# Patient Record
Sex: Female | Born: 1990 | Race: Black or African American | Hispanic: No | Marital: Single | Smoking: Never smoker
Health system: Southern US, Community
[De-identification: ages and names within clinical notes are randomized; demographics above are authoritative.]

## PROBLEM LIST (undated history)

## (undated) ENCOUNTER — Inpatient Hospital Stay (HOSPITAL_COMMUNITY): Payer: Self-pay

## (undated) ENCOUNTER — Inpatient Hospital Stay (HOSPITAL_COMMUNITY): Payer: Medicaid Other

## (undated) DIAGNOSIS — J302 Other seasonal allergic rhinitis: Secondary | ICD-10-CM

## (undated) DIAGNOSIS — B999 Unspecified infectious disease: Secondary | ICD-10-CM

## (undated) DIAGNOSIS — O139 Gestational [pregnancy-induced] hypertension without significant proteinuria, unspecified trimester: Secondary | ICD-10-CM

## (undated) DIAGNOSIS — L309 Dermatitis, unspecified: Secondary | ICD-10-CM

## (undated) DIAGNOSIS — N926 Irregular menstruation, unspecified: Secondary | ICD-10-CM

## (undated) DIAGNOSIS — O409XX0 Polyhydramnios, unspecified trimester, not applicable or unspecified: Secondary | ICD-10-CM

## (undated) DIAGNOSIS — Z3687 Encounter for antenatal screening for uncertain dates: Secondary | ICD-10-CM

## (undated) DIAGNOSIS — R51 Headache: Secondary | ICD-10-CM

## (undated) DIAGNOSIS — N12 Tubulo-interstitial nephritis, not specified as acute or chronic: Secondary | ICD-10-CM

## (undated) HISTORY — DX: Unspecified infectious disease: B99.9

## (undated) HISTORY — PX: NO PAST SURGERIES: SHX2092

## (undated) HISTORY — DX: Headache: R51

## (undated) HISTORY — DX: Polyhydramnios, unspecified trimester, not applicable or unspecified: O40.9XX0

## (undated) HISTORY — DX: Gestational (pregnancy-induced) hypertension without significant proteinuria, unspecified trimester: O13.9

---
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
## 2011-04-05 NOTE — L&D Delivery Note (Signed)
Delivery Note At 8:55 PM a viable female infant was delivered via Vaginal, Spontaneous Delivery (Presentation: LOA.  APGAR: weight .   Placenta status: Intact, Spontaneous.  Cord: 3 vessels with the following complications. Nuchal cord x 1 and reduced before delivery of anterior shoulder. Body cord released with delivery of the body. [place] Cord Blood Donor None.  Cord pH: not done Baby delivered on to maternal abdomen and skin to skin initiated immediately after delivery. Anesthesia: Epidural  Episiotomy: None Lacerations: None Suture Repair: not required Est. Blood Loss (mL): 200  Mom will transfer to Postpartum unit with her baby.  Baby to nursery-stable  Orvetta Danielski, CNM. 01/19/2012, 9:17 PM

## 2011-06-17 ENCOUNTER — Emergency Department (HOSPITAL_COMMUNITY): Payer: Medicaid Other

## 2011-06-17 ENCOUNTER — Emergency Department (HOSPITAL_COMMUNITY)
Admission: EM | Admit: 2011-06-17 | Discharge: 2011-06-17 | Disposition: A | Discharge status: Home / Self Care | Payer: Medicaid Other | Attending: Emergency Medicine | Admitting: Emergency Medicine

## 2011-06-17 ENCOUNTER — Encounter (HOSPITAL_COMMUNITY): Payer: Self-pay | Admitting: Emergency Medicine

## 2011-06-17 ENCOUNTER — Other Ambulatory Visit: Payer: Self-pay

## 2011-06-17 DIAGNOSIS — O99891 Other specified diseases and conditions complicating pregnancy: Secondary | ICD-10-CM | POA: Insufficient documentation

## 2011-06-17 DIAGNOSIS — Z331 Pregnant state, incidental: Secondary | ICD-10-CM

## 2011-06-17 DIAGNOSIS — N949 Unspecified condition associated with female genital organs and menstrual cycle: Secondary | ICD-10-CM | POA: Insufficient documentation

## 2011-06-17 DIAGNOSIS — R3919 Other difficulties with micturition: Secondary | ICD-10-CM | POA: Insufficient documentation

## 2011-06-17 DIAGNOSIS — R1110 Vomiting, unspecified: Secondary | ICD-10-CM | POA: Insufficient documentation

## 2011-06-17 DIAGNOSIS — N898 Other specified noninflammatory disorders of vagina: Secondary | ICD-10-CM | POA: Insufficient documentation

## 2011-06-17 DIAGNOSIS — R112 Nausea with vomiting, unspecified: Secondary | ICD-10-CM | POA: Insufficient documentation

## 2011-06-17 DIAGNOSIS — R109 Unspecified abdominal pain: Secondary | ICD-10-CM | POA: Insufficient documentation

## 2011-06-17 DIAGNOSIS — R10819 Abdominal tenderness, unspecified site: Secondary | ICD-10-CM | POA: Insufficient documentation

## 2011-06-17 LAB — URINE MICROSCOPIC-ADD ON

## 2011-06-17 LAB — URINALYSIS, ROUTINE W REFLEX MICROSCOPIC
Bilirubin Urine: NEGATIVE
Glucose, UA: NEGATIVE mg/dL
Hgb urine dipstick: NEGATIVE
Protein, ur: NEGATIVE mg/dL
Specific Gravity, Urine: 1.019 (ref 1.005–1.030)
Urobilinogen, UA: 0.2 mg/dL (ref 0.0–1.0)

## 2011-06-17 LAB — WET PREP, GENITAL
Clue Cells Wet Prep HPF POC: NONE SEEN
Trich, Wet Prep: NONE SEEN
Yeast Wet Prep HPF POC: NONE SEEN

## 2011-06-17 LAB — HCG, QUANTITATIVE, PREGNANCY: hCG, Beta Chain, Quant, S: 120548 m[IU]/mL — ABNORMAL HIGH (ref <5)

## 2011-06-17 LAB — POCT PREGNANCY, URINE: Preg Test, Ur: POSITIVE — AB

## 2011-06-17 MED ORDER — ONDANSETRON 8 MG PO TBDP: 8.0000 mg | ORAL_TABLET | Freq: Three times a day (TID) | ORAL | Status: AC | PRN

## 2011-06-17 MED ORDER — ONDANSETRON HCL 8 MG PO TABS: 4.0000 mg | ORAL_TABLET | Freq: Three times a day (TID) | ORAL | Status: DC | PRN

## 2011-06-17 MED ORDER — ZOLPIDEM TARTRATE 5 MG PO TABS: 5.0000 mg | ORAL_TABLET | Freq: Every evening | ORAL | Status: DC | PRN

## 2011-06-17 MED ORDER — IBUPROFEN 200 MG PO TABS: 600.0000 mg | ORAL_TABLET | Freq: Three times a day (TID) | ORAL | Status: DC | PRN

## 2011-06-17 MED ORDER — ACETAMINOPHEN 325 MG PO TABS: 650.0000 mg | ORAL_TABLET | ORAL | Status: DC | PRN

## 2011-06-17 MED ORDER — ONDANSETRON 4 MG PO TBDP
8.0000 mg | ORAL_TABLET | Freq: Once | ORAL | Status: AC
  Administered 2011-06-17: 8 mg via ORAL
  Filled 2011-06-17: qty 2

## 2011-06-17 NOTE — ED Provider Notes (Signed)
History     CSN: [id number]  Arrival date & time 06/17/11  1032   First MD Initiated Contact with Patient 06/17/11 1319      Chief Complaint  Patient presents with  . Emesis    (Consider location/radiation/quality/duration/timing/severity/associated sxs/prior treatment) HPI Comments: Theresa Lawson is a 21 y.o. female who is here for low abdominal pain with nausea and vomiting. For one to 2 weeks. She has chronic intermittent chest pain since she was an infant. Her last menstrual period was 05/06/2011. She denies vaginal bleeding or discharge. She had an eye daily until 3 months ago, when it was removed. She has no fever, or diarrhea. Has been a change in urination. She's not tried any medicines at home. She thinks that she might be pregnant.  The history is provided by the patient.    History reviewed. No pertinent past medical history.  History reviewed. No pertinent past surgical history.  No family history on file.  History  Substance Use Topics  . Smoking status: Not on file  . Smokeless tobacco: Not on file  . Alcohol Use: Not on file    OB History    Grav Para Term Preterm Abortions TAB SAB Ect Mult Living                  Review of Systems  All other systems reviewed and are negative.    Allergies  Review of patient's allergies indicates no known allergies.  Home Medications  No current outpatient prescriptions on file.  BP 111/66  Pulse 73  Temp(Src) 98.1 F (36.7 C) (Oral)  Resp 16  SpO2 99%  LMP 05/06/2011  Physical Exam  Nursing note and vitals reviewed. Constitutional: She is oriented to person, place, and time. She appears well-developed and well-nourished.  HENT:  Head: Normocephalic and atraumatic.  Eyes: Conjunctivae and EOM are normal. Pupils are equal, round, and reactive to light.  Neck: Normal range of motion and phonation normal. Neck supple.  Cardiovascular: Normal rate, regular rhythm and intact distal pulses.   Pulmonary/Chest:  Effort normal and breath sounds normal. She exhibits no tenderness.  Abdominal: Soft. She exhibits no distension and no mass. There is tenderness (Bilateral right and lower quadrant tenderness, mild). There is no rebound and no guarding.  Genitourinary:       Normal external female genitalia. Small amount of opaque vaginal discharge. Cervix is normal. It is long and closed. Bimanual examination : the uterus is not enlarged. There is mild midline and left adnexal tenderness. No palpable mass. The ovaries could not be palpated.   Musculoskeletal: Normal range of motion.  Neurological: She is alert and oriented to person, place, and time. She has normal strength. She exhibits normal muscle tone.  Skin: Skin is warm and dry.  Psychiatric: She has a normal mood and affect. Her behavior is normal. Judgment and thought content normal.    ED Course  Procedures (including critical care time)  Date: 06/17/2011  Rate: 80  Rhythm: normal sinus rhythm  QRS Axis: normal  Intervals: normal  ST/T Wave abnormalities: normal  Conduction Disutrbances:none  Narrative Interpretation:   Old EKG Reviewed: unchanged  5:35 PM Reevaluation with update and discussion. After initial assessment and treatment, an updated evaluation reveals patient is comfortable. Repeat vital signs are normal.. Theresa Lawson    Labs Reviewed  URINALYSIS, ROUTINE W REFLEX MICROSCOPIC - Abnormal; Notable for the following:    APPearance CLOUDY (*)    pH 8.5 (*)    Leukocytes,  UA TRACE (*)    All other components within normal limits  POCT PREGNANCY, URINE - Abnormal; Notable for the following:    Preg Test, Ur POSITIVE (*)    All other components within normal limits  URINE MICROSCOPIC-ADD ON - Abnormal; Notable for the following:    Bacteria, UA FEW (*)    All other components within normal limits  WET PREP, GENITAL - Abnormal; Notable for the following:    WBC, Wet Prep HPF POC FEW (*)    All other components within  normal limits  HCG, QUANTITATIVE, PREGNANCY - Abnormal; Notable for the following:    hCG, Beta Chain, [place] Longest [id number] (*)    All other components within normal limits  GC/CHLAMYDIA PROBE AMP, GENITAL   US Ob Comp Less 14 Wks  06/17/2011  *RADIOLOGY REPORT*  Clinical Data: *RADIOLOGY REPORT*  Clinical Data: Pelvic pain.  OBSTETRIC <14 WK [place] AND TRANSVAGINAL OB US  Technique:  Both transabdominal and transvaginal ultrasound examinations were performed for complete evaluation of the gestation as well as the maternal uterus, adnexal regions, and pelvic cul-de-sac.  Transvaginal technique was performed to assess early pregnancy.  Comparison:  None.  Intrauterine gestational sac:  Visualized/normal in shape. Yolk sac: Seen Embryo: Seen Cardiac Activity: Seen Heart Rate: 150 bpm  MSD:   mm      w     d CRL: 29.0   mm  9   w  5   d        [place] EDC: 01/15/2012  Maternal uterus/adnexae: Both ovaries appear normal. A small subacute subchorionic hemorrhage is seen.  IMPRESSION: Single living intrauterine pregnancy demonstrating an EGA by CRL of 9w 5d. Small subacute subchorionic hemorrhage. Normal ovaries.  Original Report Authenticated By: Bertha Stakes, M.D.   US Ob Transvaginal  06/17/2011  *RADIOLOGY REPORT*  Clinical Data: *RADIOLOGY REPORT*  Clinical Data: Pelvic pain.  OBSTETRIC <14 WK [place] AND TRANSVAGINAL OB US  Technique:  Both transabdominal and transvaginal ultrasound examinations were performed for complete evaluation of the gestation as well as the maternal uterus, adnexal regions, and pelvic cul-de-sac.  Transvaginal technique was performed to assess early pregnancy.  Comparison:  None.  Intrauterine gestational sac:  Visualized/normal in shape. Yolk sac: Seen Embryo: Seen Cardiac Activity: Seen Heart Rate: 150 bpm  MSD:   mm      w     d CRL: 29.0   mm  9   w  5   d        [place] EDC: 01/15/2012  Maternal uterus/adnexae: Both ovaries appear normal. A small subacute subchorionic hemorrhage is seen.   IMPRESSION: Single living intrauterine pregnancy demonstrating an EGA by CRL of 9w 5d. Small subacute subchorionic hemorrhage. Normal ovaries.  Original Report Authenticated By: Bertha Stakes, M.D.     1. Abdominal pain   2. Pregnancy, incidental       MDM  Nonspecific lower abdominal pain, with early pregnancy, not an ectopic. No evidence of vaginal or urinary tract infection, cultures are ordered  Plan: Home Medications- none; Home Treatments- rest, Tylenol prn; Recommended follow up- OB appt. ASAP        Flint Melter, MD 06/17/11 [phone]

## 2011-06-17 NOTE — ED Notes (Signed)
NPO until all results back.

## 2011-06-17 NOTE — ED Notes (Signed)
N/v/cp x 1-2 weeks she states

## 2011-06-17 NOTE — Discharge Instructions (Signed)
Abdominal Pain (Nonspecific)  Your exam might not show the exact reason you have abdominal pain. Since there are many different causes of abdominal pain, another checkup and more tests may be needed. It is very important to follow up for lasting (persistent) or worsening symptoms. A possible cause of abdominal pain in any person who still has his or her appendix is acute appendicitis. Appendicitis is often hard to diagnose. Normal blood tests, urine tests, ultrasound, and CT scans do not completely rule out early appendicitis or other causes of abdominal pain. Sometimes, only the changes that happen over time will allow appendicitis and other causes of abdominal pain to be determined. Other potential problems that may require surgery may also take time to become more apparent. Because of this, it is important that you follow all of the instructions below.  HOME CARE INSTRUCTIONS   · Rest as much as possible.  · Do not eat solid food until your pain is gone.  · While adults or children have pain: A diet of water, weak decaffeinated tea, broth or bouillon, gelatin, oral rehydration solutions (ORS), frozen ice pops, or ice chips may be helpful.  · When pain is gone in adults or children: Start a light diet (dry toast, crackers, applesauce, or white rice). Increase the diet slowly as long as it does not bother you. Eat no dairy products (including cheese and eggs) and no spicy, fatty, fried, or high-fiber foods.  · Use no alcohol, caffeine, or cigarettes.  · Take your regular medicines unless your caregiver told you not to.  · Take any prescribed medicine as directed.  · Only take over-the-counter or prescription medicines for pain, discomfort, or fever as directed by your caregiver. Do not give aspirin to children.  If your caregiver has given you a follow-up appointment, it is very important to keep that appointment. Not keeping the appointment could result in a permanent injury and/or lasting (chronic) pain and/or  disability. If there is any problem keeping the appointment, you must call to reschedule.   SEEK IMMEDIATE MEDICAL CARE IF:   · Your pain is not gone in 24 hours.  · Your pain becomes worse, changes location, or feels different.  · You or your child has an oral temperature above 102° F (38.9° C), not controlled by medicine.  · Your baby is older than 3 months with a rectal temperature of 102° F (38.9° C) or higher.  · Your baby is 3 months old or younger with a rectal temperature of 100.4° F (38° C) or higher.  · You have shaking chills.  · You keep throwing up (vomiting) or cannot drink liquids.  · There is blood in your vomit or you see blood in your bowel movements.  · Your bowel movements become dark or black.  · You have frequent bowel movements.  · Your bowel movements stop (become blocked) or you cannot pass gas.  · You have bloody, frequent, or painful urination.  · You have yellow discoloration in the skin or whites of the eyes.  · Your stomach becomes bloated or bigger.  · You have dizziness or fainting.  · You have chest or back pain.  MAKE SURE YOU:   · Understand these instructions.  · Will watch your condition.  · Will get help right away if you are not doing well or get worse.  Document Released: 03/21/2005 Document Revised: 03/10/2011 Document Reviewed: 02/16/2009  ExitCare® Patient Information ©2012 [employer].  Pregnancy  If you are   planning on getting pregnant, it is a good idea to make a preconception appointment with your care- giver to discuss having a healthy lifestyle before getting pregnant. Such as, diet, weight, exercise, taking prenatal vitamins especially folic acid (it helps prevent brain and spinal cord defects), avoiding alcohol, smoking and illegal drugs, medical problems (diabetes, convulsions), family history of genetic problems, working conditions and immunizations. It is better to have knowledge of these things and do something about them before getting pregnant.  In your  pregnancy, it is important to follow certain guidelines to have a healthy baby. It is very important to get good prenatal care and follow your caregiver's instructions. Prenatal care includes all the medical care you receive before your baby's birth. This helps to prevent problems during the pregnancy and childbirth.  HOME CARE INSTRUCTIONS   · Start your prenatal visits by the 12th week of pregnancy or before when possible. They are usually scheduled monthly at first. They are more often in the last 2 months before delivery. It is important that you keep your caregiver's appointments and follow your caregiver's instructions regarding medication use, exercise, and diet.  · During pregnancy, you are providing food for you and your baby. Eat a regular, well-balanced diet. Choose foods such as meat, fish, milk and other dairy products, vegetables, fruits, whole-grain breads and cereals. Your caregiver will inform you of the ideal weight gain depending on your current height and weight. Drink lots of liquids. Try to drink 8 glasses of water a day.  · Alcohol is associated with a number of birth defects including fetal alcohol syndrome. It is best to avoid alcohol completely. Smoking will cause low birth rate and prematurity. Use of alcohol and nicotine during your pregnancy also increases the chances that your child will be chemically dependent later in their life and may contribute to SIDS (Sudden Infant Death Syndrome).  · Do not use illegal drugs.  · Only take prescription or over-the-counter medications that are recommended by your caregiver. Other medications can cause genetic and physical problems in the baby.  · Morning sickness can often be helped by keeping soda crackers at the bedside. Eat a couple before arising in the morning.  · A sexual relationship may be continued until near the end of pregnancy if there are no other problems such as early (premature) leaking of amniotic fluid from the membranes, vaginal  bleeding, painful intercourse or belly (abdominal) pain.  · Exercise regularly. Check with your caregiver if you are unsure of the safety of some of your exercises.  · Do not use hot tubs, steam rooms or saunas. These increase the risk of fainting or passing out and hurting yourself and the baby. Swimming is OK for exercise. Get plenty of rest, including afternoon naps when possible especially in the third trimester.  · Avoid toxic odors and chemicals.  · Do not wear high heels. They may cause you to lose your balance and fall.  · Do not lift over 5 pounds. If you do lift anything, lift with your legs and thighs, not your back.  · Avoid long trips, especially in the third trimester.  · If you have to travel out of the city or state, take a copy of your medical records with you.  SEEK IMMEDIATE MEDICAL CARE IF:   · You develop an unexplained oral temperature above 102° F (38.9° C), or as your caregiver suggests.  · You have leaking of fluid from the vagina. If leaking membranes are suspected,   take your temperature and inform your caregiver of this when you call.  · There is vaginal spotting or bleeding. Notify your caregiver of the amount and how many pads are used.  · You continue to feel sick to your stomach (nauseous) and have no relief from remedies suggested, or you throw up (vomit) blood or coffee ground like materials.  · You develop upper abdominal pain.  · You have round ligament discomfort in the lower abdominal area. This still must be evaluated by your caregiver.  · You feel contractions of the uterus.  · You do not feel the baby move, or there is less movement than before.  · You have painful urination.  · You have abnormal vaginal discharge.  · You have persistent diarrhea.  · You get a severe headache.  · You have problems with your vision.  · You develop muscle weakness.  · You feel dizzy and faint.  · You develop shortness of breath.  · You develop chest pain.  · You have back pain that travels down  to your leg and feet.  · You feel irregular or a very fast heartbeat.  · You develop excessive weight gain in a short period of time (5 pounds in 3 to 5 days).  · You are involved with a domestic violence situation.  Document Released: 03/21/2005 Document Revised: 03/10/2011 Document Reviewed: 09/12/2008  ExitCare® Patient Information ©2012 [employer].

## 2011-06-17 NOTE — ED Notes (Signed)
Pt. At Ultrasound

## 2011-06-18 LAB — GC/CHLAMYDIA PROBE AMP, GENITAL: Chlamydia, DNA Probe: NEGATIVE

## 2011-08-05 ENCOUNTER — Inpatient Hospital Stay (HOSPITAL_COMMUNITY): Payer: Medicaid Other

## 2011-08-05 ENCOUNTER — Inpatient Hospital Stay (HOSPITAL_COMMUNITY)
Admission: AD | Admit: 2011-08-05 | Discharge: 2011-08-05 | Disposition: A | Discharge status: Home / Self Care | Payer: Medicaid Other | Source: Ambulatory Visit | Attending: Obstetrics & Gynecology | Admitting: Obstetrics & Gynecology

## 2011-08-05 ENCOUNTER — Encounter (HOSPITAL_COMMUNITY): Payer: Self-pay

## 2011-08-05 DIAGNOSIS — R109 Unspecified abdominal pain: Secondary | ICD-10-CM

## 2011-08-05 DIAGNOSIS — R519 Headache, unspecified: Secondary | ICD-10-CM

## 2011-08-05 DIAGNOSIS — O0930 Supervision of pregnancy with insufficient antenatal care, unspecified trimester: Secondary | ICD-10-CM | POA: Insufficient documentation

## 2011-08-05 DIAGNOSIS — O219 Vomiting of pregnancy, unspecified: Secondary | ICD-10-CM

## 2011-08-05 DIAGNOSIS — O26892 Other specified pregnancy related conditions, second trimester: Secondary | ICD-10-CM

## 2011-08-05 DIAGNOSIS — O210 Mild hyperemesis gravidarum: Secondary | ICD-10-CM

## 2011-08-05 DIAGNOSIS — O26899 Other specified pregnancy related conditions, unspecified trimester: Secondary | ICD-10-CM

## 2011-08-05 HISTORY — DX: Other seasonal allergic rhinitis: J30.2

## 2011-08-05 HISTORY — DX: Tubulo-interstitial nephritis, not specified as acute or chronic: N12

## 2011-08-05 LAB — URINALYSIS, ROUTINE W REFLEX MICROSCOPIC
Bilirubin Urine: NEGATIVE
Glucose, UA: NEGATIVE mg/dL
Hgb urine dipstick: NEGATIVE
Ketones, ur: NEGATIVE mg/dL
Nitrite: NEGATIVE
Specific Gravity, Urine: 1.015 (ref 1.005–1.030)
pH: 7.5 (ref 5.0–8.0)

## 2011-08-05 LAB — CBC
HCT: 31 % — ABNORMAL LOW (ref 36.0–46.0)
Hemoglobin: 10.3 g/dL — ABNORMAL LOW (ref 12.0–15.0)
MCH: 28.3 pg (ref 26.0–34.0)
MCHC: 33.2 g/dL (ref 30.0–36.0)
MCV: 85.2 fL (ref 78.0–100.0)
RBC: 3.64 MIL/uL — ABNORMAL LOW (ref 3.87–5.11)

## 2011-08-05 LAB — WET PREP, GENITAL: Yeast Wet Prep HPF POC: NONE SEEN

## 2011-08-05 MED ORDER — PRENATAL VITAMINS (DIS) PO TABS: 1.0000 | ORAL_TABLET | ORAL | Status: DC

## 2011-08-05 MED ORDER — ONDANSETRON 8 MG PO TBDP: 8.0000 mg | ORAL_TABLET | Freq: Three times a day (TID) | ORAL | Status: AC | PRN

## 2011-08-05 MED ORDER — PROMETHAZINE HCL 25 MG PO TABS: 25.0000 mg | ORAL_TABLET | Freq: Four times a day (QID) | ORAL | Status: DC | PRN

## 2011-08-05 MED ORDER — ONDANSETRON 8 MG PO TBDP
8.0000 mg | ORAL_TABLET | Freq: Once | ORAL | Status: AC
  Administered 2011-08-05: 8 mg via ORAL
  Filled 2011-08-05: qty 1

## 2011-08-05 NOTE — MAU Provider Note (Signed)
History     CSN: [id number]  Arrival date and time: 08/05/11 1020   First Provider Initiated Contact with Patient 08/05/11 1107      Chief Complaint  Patient presents with  . Hyperemesis Gravidarum   HPI Pt is G2P1 with hx of irreg menses.  She had a Mirena taken out and LMP ?Jan or Feb.  She had n ultrasound done at [facility] ED. On 06/17/2011 showing [redacted]w[redacted]d making her [redacted]w[redacted]d today. She also needs confirmation of pregnancy.  She has had a headache and also nausea and vomiting. Pt denies spotting or bleeding or cramping.  She has had little appetite but has been able to keep fluids down.  Her 20 year old son was delivered in [facility] and pregnancy was complicated by hypertension.  She had a vaginal delivery after induction for post dates.  Past Medical History  Diagnosis Date  . Pyelonephritis   . Asthma     childhood  . Seasonal allergies     Past Surgical History  Procedure Date  . Vaginal delivery     History reviewed. No pertinent family history.  History  Substance Use Topics  . Smoking status: Former Games developer  . Smokeless tobacco: Never Used  . Alcohol Use: No    Allergies: No Known Allergies  No prescriptions prior to admission    Review of Systems  Constitutional: Negative for chills.  Respiratory: Negative for cough.   Gastrointestinal: Positive for nausea, vomiting and abdominal pain. Negative for diarrhea and constipation.  Genitourinary: Negative for dysuria and urgency.  Neurological: Positive for headaches. Negative for dizziness.   Physical Exam   Blood pressure 120/66, pulse 98, temperature 98.1 F (36.7 C), resp. rate 18, height 5\' 4"  (1.626 m), weight 133 lb (60.328 kg), last menstrual period 05/06/2011.  Physical Exam  Nursing note and vitals reviewed. Constitutional: She is oriented to person, place, and time. She appears well-developed and well-nourished.  HENT:  Head: Normocephalic.  Eyes: Pupils are equal, round, and reactive to light.    Neck: Normal range of motion. Neck supple.  Respiratory: Effort normal.  GI: Soft. She exhibits no distension and no mass. There is no tenderness. There is no rebound and no guarding.       RLQ mild tenderness with bimanual FHT audible with doppler  Genitourinary: Vagina normal. No vaginal discharge found.       Uterus 16 week size, NT , cervix clean and closed, NT; mild right adnexal tenderness- no rebound  Musculoskeletal: Normal range of motion.  Neurological: She is alert and oriented to person, place, and time.  Skin: Skin is warm and dry.  Psychiatric: She has a normal mood and affect.    MAU Course  Procedures Clinical Data: *RADIOLOGY REPORT* from 06/17/2011 Clinical Data: Pelvic pain.  OBSTETRIC <14 WK [place] AND TRANSVAGINAL OB US  Technique: Both transabdominal and transvaginal ultrasound  examinations were performed for complete evaluation of the  gestation as well as the maternal uterus, adnexal regions, and  pelvic cul-de-sac. Transvaginal technique was performed to assess  early pregnancy.  Comparison: None.  Intrauterine gestational sac: Visualized/normal in shape.  Yolk sac: Seen  Embryo: Seen  Cardiac Activity: Seen  Heart Rate: 150 bpm  MSD: mm w d  CRL: 29.0 mm 9 w 5 d [place] EDC: 01/15/2012  Maternal uterus/adnexae:  Both ovaries appear normal. A small subacute subchorionic  hemorrhage is seen.  IMPRESSION:  Single living intrauterine pregnancy demonstrating an EGA by CRL of  9w 5d. Small  subacute subchorionic hemorrhage. Normal ovaries.  Original Report Authenticated By: Bertha Stakes, M.D.            External Result Report    Results for orders placed during the hospital encounter of 08/05/11 (from the past 24 hour(s))  URINALYSIS, ROUTINE W REFLEX MICROSCOPIC     Status: Abnormal   Collection Time   08/05/11 10:34 AM      Component Value Range   Color, Urine YELLOW  YELLOW    APPearance CLOUDY (*) CLEAR    Specific Gravity, Urine 1.015  1.005  - 1.030    pH 7.5  5.0 - 8.0    Glucose, UA NEGATIVE  NEGATIVE (mg/dL)   Hgb urine dipstick NEGATIVE  NEGATIVE    Bilirubin Urine NEGATIVE  NEGATIVE    Ketones, ur NEGATIVE  NEGATIVE (mg/dL)   Protein, ur NEGATIVE  NEGATIVE (mg/dL)   Urobilinogen, UA 0.2  0.0 - 1.0 (mg/dL)   Nitrite NEGATIVE  NEGATIVE    Leukocytes, UA NEGATIVE  NEGATIVE   WET PREP, GENITAL     Status: Abnormal   Collection Time   08/05/11 11:00 AM      Component Value Range   Yeast Wet Prep HPF POC NONE SEEN  NONE SEEN    Trich, Wet Prep NONE SEEN  NONE SEEN    Clue Cells Wet Prep HPF POC NONE SEEN  NONE SEEN    WBC, Wet Prep HPF POC FEW (*) NONE SEEN   CBC     Status: Abnormal   Collection Time   08/05/11 11:20 AM      Component Value Range   WBC 7.9  4.0 - 10.5 (K/uL)   RBC 3.64 (*) 3.87 - 5.11 (MIL/uL)   Hemoglobin 10.3 (*) 12.0 - 15.0 (g/dL)   HCT 96.0 (*) 45.4 - 46.0 (%)   MCV 85.2  78.0 - 100.0 (fL)   MCH 28.3  26.0 - 34.0 (pg)   MCHC 33.2  30.0 - 36.0 (g/dL)   RDW 09.8  11.9 - 14.7 (%)   Platelets 212  150 - 400 (K/uL)     Assessment and Plan  Second trimester pregnancy- no prenatal care- confirmation of pregnancy letter Proceed with Medicaid application Prescription for phenergan and zofran and prenatal vitamins abd pain in pregnancy Nausea and vomiting in pregnancy-   Brisa Auth 08/05/2011, 11:31 AM

## 2011-08-05 NOTE — Discharge Instructions (Signed)
    ________________________________________     To schedule your Maternity Eligibility Appointment, please call [phone].  When you arrive for your appointment you must bring the following items or information listed below.  Your appointment will be rescheduled if you do not have these items or are 15 minutes late. If currently receiving Medicaid, you MUST bring: 1. Medicaid Card 2. Social Security Card 3. Picture ID 4. Proof of Pregnancy 5. Verification of current address if the address on Medicaid card is incorrect "postmarked mail" If not receiving Medicaid, you MUST bring: 1. Social Security Card 2. Picture ID 3. Birth Certificate (if available) Passport or *Green Card 4. Proof of Pregnancy 5. Verification of current address "postmarked mail" for each income presented. 6. Verification of insurance coverage, if any 7. Check stubs from each employer for the previous month (if unable to present check stub  for each week, we will accept check stub for the first and last week ill the same month.) If you can't locate check stubs, you must bring a letter from the employer(s) and it must have the following information on letterhead, typed, in English: o name of company o company telephone number o how long been with the company, if less than one month o how much person earns per hour o how many hours per week work o the gross pay the person earned for the previous month If you are 19 years old or less, you do not have to bring proof of income unless you work or live with the father of the baby and at that time we will need proof of income from you and/or the father of the baby. Green Card recipients are eligible for Medicaid for Pregnant Women (MPW)    

## 2011-08-05 NOTE — MAU Note (Signed)
Pt states no PNC, notes increased vaginal d/c, no odor or abnormal color. Feeling nauseated a lot, denies bleeding. +FHT's in triage. LMP questionably in January or February. Hx irregular menstrual cycles.

## 2011-08-05 NOTE — MAU Provider Note (Signed)
Attestation of Attending Supervision of Advanced Practitioner: Evaluation and management procedures were performed by the [facility] Fellow/PA/CNM/NP under my supervision and collaboration. Chart reviewed, and agree with management and plan.  Jaynie Collins, M.D. 08/05/2011 7:39 PM

## 2011-08-05 NOTE — MAU Note (Signed)
N/V, headache, mild stomach pain. Needs pregnancy verification for medicaid.

## 2011-08-06 LAB — GC/CHLAMYDIA PROBE AMP, GENITAL
Chlamydia, DNA Probe: NEGATIVE
GC Probe Amp, Genital: NEGATIVE

## 2011-08-31 ENCOUNTER — Encounter: Payer: Self-pay | Admitting: Obstetrics and Gynecology

## 2011-09-13 ENCOUNTER — Telehealth: Payer: Self-pay | Admitting: Obstetrics and Gynecology

## 2011-09-13 NOTE — Telephone Encounter (Signed)
PT RTND CALL, STATES HAS BEEN HAVING SOME LOW ABD PAIN, DENIES ANY BLDG, HAS NOT TAKEN ANYTHING OR DONE ANYTHING TO RELIEVE PAIN.  REQUESTS APPT.  PT ADVISED TO TAKE REG OR ES TYLENOL AS DIRECTED AND PUSH FLUIDS, MAY ALSO TRY SOAKING IN WARM TUB FOR ABOUT 20 MIN, IF NO RELIEF TO CALL BACK.  PT ALSO ADVISED TO ARRIVE TO NOB INTERVIEW APPT @ 0845 FOR 0900 APPT AND WILL HAVE NOB WORK UP AFTER.  ENCOURAGED TO ARRIVE ON TIME.  PT VOICES UNDERSTANDING.

## 2011-09-13 NOTE — Telephone Encounter (Signed)
TC TO PT REGARDING MSG, LM ON VM TO CALL BACK AND TO CALL AFTER HOURS AND SPEAK W/ ON CALL PROVIDER IN AN EMERGENCY.

## 2011-09-15 ENCOUNTER — Ambulatory Visit (INDEPENDENT_AMBULATORY_CARE_PROVIDER_SITE_OTHER): Payer: Medicaid Other

## 2011-09-15 ENCOUNTER — Other Ambulatory Visit: Payer: Self-pay | Admitting: Obstetrics and Gynecology

## 2011-09-15 ENCOUNTER — Ambulatory Visit (INDEPENDENT_AMBULATORY_CARE_PROVIDER_SITE_OTHER): Payer: Medicaid Other | Admitting: Obstetrics and Gynecology

## 2011-09-15 ENCOUNTER — Encounter: Payer: Self-pay | Admitting: Obstetrics and Gynecology

## 2011-09-15 VITALS — BP 110/70 | Wt 147.0 lb

## 2011-09-15 DIAGNOSIS — Z331 Pregnant state, incidental: Secondary | ICD-10-CM

## 2011-09-15 DIAGNOSIS — Z3689 Encounter for other specified antenatal screening: Secondary | ICD-10-CM

## 2011-09-15 DIAGNOSIS — Z3490 Encounter for supervision of normal pregnancy, unspecified, unspecified trimester: Secondary | ICD-10-CM

## 2011-09-15 LAB — POCT URINALYSIS DIPSTICK
Ketones, UA: NEGATIVE
Protein, UA: NEGATIVE
Spec Grav, UA: 1.015
pH, UA: 7

## 2011-09-15 NOTE — Progress Notes (Signed)
Pt has anatomy u/s today @ 2:30 pm Unsure about genetic testing AFP & harmony info given

## 2011-09-15 NOTE — Progress Notes (Signed)
Subjective:    Brocha Gilliam is being seen today for her first obstetrical visit.  She is at [redacted]w[redacted]d gestation by LMP and [place] at 9 5/7 weeks in MAU. Had hyperemesis with early part of pregnancy, now resolved.  Her obstetrical history is significant for: Hx gestational hypertension in last pregnancy--no medications Hx pyelonephritis Hx hyperemesis   Review of Systems Pertinent ROS is described in HPI   Objective:   BP 110/70  Wt 147 lb (66.679 kg)  LMP 05/06/2011 Wt Readings from Last 1 Encounters:  09/15/11 147 lb (66.679 kg)   BMI:  Pre-gravid BMI 22.3 General: alert, cooperative and no distress Respiratory: clear to auscultation bilaterally Cardiovascular: regular rate and rhythm, S1, S2 normal, no murmur Gastrointestinal: soft, non-tender; no masses,  no organomegaly Extremities: extremities normal, no pain or edema Vaginal Bleeding: none  EXTERNAL GENITALIA: normal appearing vulva with no masses, tenderness or lesions VAGINA: no abnormal discharge or lesions CERVIX: no lesions or cervical motion tenderness UTERUS: gravid and consistent with 23-24 weeks ADNEXA: no masses palpable and nontender OB EXAM PELVIMETRY: appears adequate   FHR:  150  bpm  Assessment:    Pregnancy at 22 and 4/7 weeks    Plan:    Anatomy scan today:  21 6/7 weeks, c/w dates. 465 gms, 18%ile. Cervix 3.92.  Female, normal anatomy, anterior placenta.  Prenatal panel reviewed and discussed with the patient: Pap smear collected: No (due to age) GC/Chlamydia collected:yes Discussion of Genetic testing options:  Quad screen today Prenatal vitamins recommended Problem list reviewed and updated.  Plan of care: Follow up in 4 weeks. Glucola at 28-30 weeks  [name] CNM, [place] 09/18/2011 11:44 AM

## 2011-09-16 LAB — PRENATAL PANEL VII
Basophils Relative: 0 % (ref 0–1)
Eosinophils Absolute: 0.4 10*3/uL (ref 0.0–0.7)
HIV: NONREACTIVE
Hepatitis B Surface Ag: NEGATIVE
Lymphs Abs: 1.5 10*3/uL (ref 0.7–4.0)
MCH: 29.7 pg (ref 26.0–34.0)
Neutro Abs: 8.3 10*3/uL — ABNORMAL HIGH (ref 1.7–7.7)
Neutrophils Relative %: 77 % (ref 43–77)
Platelets: 224 10*3/uL (ref 150–400)
RBC: 3.4 MIL/uL — ABNORMAL LOW (ref 3.87–5.11)
Rubella: 353.7 IU/mL — ABNORMAL HIGH

## 2011-09-18 ENCOUNTER — Encounter: Payer: Self-pay | Admitting: Obstetrics and Gynecology

## 2011-09-18 DIAGNOSIS — Z87440 Personal history of urinary (tract) infections: Secondary | ICD-10-CM | POA: Insufficient documentation

## 2011-09-18 DIAGNOSIS — O09899 Supervision of other high risk pregnancies, unspecified trimester: Secondary | ICD-10-CM | POA: Insufficient documentation

## 2011-09-18 DIAGNOSIS — Z8759 Personal history of other complications of pregnancy, childbirth and the puerperium: Secondary | ICD-10-CM

## 2011-09-18 DIAGNOSIS — O0930 Supervision of pregnancy with insufficient antenatal care, unspecified trimester: Secondary | ICD-10-CM | POA: Insufficient documentation

## 2011-09-19 LAB — AFP, QUAD SCREEN
AFP: 85 IU/mL
Age Alone: 1:1180 {titer}
HCG, Total: 2616 m[IU]/mL
Interpretation-AFP: NEGATIVE
MoM for AFP: 1.18
Open Spina bifida: NEGATIVE
uE3 Mom: 0.9
uE3 Value: 1.8 ng/mL

## 2011-09-20 LAB — HEMOGLOBINOPATHY EVALUATION
Hemoglobin Other: 0 %
Hgb A2 Quant: 3.1 % (ref 2.2–3.2)
Hgb A: 96.9 % (ref 96.8–97.8)
Hgb S Quant: 0 %

## 2011-10-02 ENCOUNTER — Inpatient Hospital Stay (HOSPITAL_COMMUNITY)
Admission: AD | Admit: 2011-10-02 | Discharge: 2011-10-02 | Disposition: A | Discharge status: Home / Self Care | Payer: Medicaid Other | Source: Ambulatory Visit | Attending: Obstetrics and Gynecology | Admitting: Obstetrics and Gynecology

## 2011-10-02 ENCOUNTER — Encounter (HOSPITAL_COMMUNITY): Payer: Self-pay | Admitting: *Deleted

## 2011-10-02 DIAGNOSIS — O0930 Supervision of pregnancy with insufficient antenatal care, unspecified trimester: Secondary | ICD-10-CM

## 2011-10-02 DIAGNOSIS — R109 Unspecified abdominal pain: Secondary | ICD-10-CM | POA: Insufficient documentation

## 2011-10-02 DIAGNOSIS — Z331 Pregnant state, incidental: Secondary | ICD-10-CM

## 2011-10-02 DIAGNOSIS — O99891 Other specified diseases and conditions complicating pregnancy: Secondary | ICD-10-CM | POA: Insufficient documentation

## 2011-10-02 DIAGNOSIS — Z87440 Personal history of urinary (tract) infections: Secondary | ICD-10-CM

## 2011-10-02 DIAGNOSIS — N949 Unspecified condition associated with female genital organs and menstrual cycle: Secondary | ICD-10-CM | POA: Diagnosis present

## 2011-10-02 DIAGNOSIS — O09899 Supervision of other high risk pregnancies, unspecified trimester: Secondary | ICD-10-CM

## 2011-10-02 LAB — URINALYSIS, ROUTINE W REFLEX MICROSCOPIC
Glucose, UA: NEGATIVE mg/dL
Ketones, ur: NEGATIVE mg/dL
Leukocytes, UA: NEGATIVE
pH: 8 (ref 5.0–8.0)

## 2011-10-02 MED ORDER — IBUPROFEN 600 MG PO TABS: 600.0000 mg | ORAL_TABLET | Freq: Four times a day (QID) | ORAL | Status: AC | PRN

## 2011-10-02 MED ORDER — CYCLOBENZAPRINE HCL 10 MG PO TABS: 10.0000 mg | ORAL_TABLET | Freq: Three times a day (TID) | ORAL | Status: AC | PRN

## 2011-10-02 NOTE — MAU Provider Note (Signed)
History    21 yo G2P1001 at 25 weeks presented unannounced c/o right sided pain and pressure with walking and moving.  Pain is absent at rest.  Denies leaking, bleeding, or dysuria, reports +FM.  Had negative cultures on 6/13 at [facility].  Pregnancy remarkable for: Patient Active Problem List  Diagnosis  . ANEMIA  . TOBACCO ABUSE  . ALLERGIC RHINITIS  . ASTHMA, CHILDHOOD  . VAGINAL DISCHARGE  . HEADACHE  . Prior pregnancy complicated by PIH, antepartum  . Hx of pyelonephritis during pregnancy  . Late prenatal care  . Round ligament pain     Chief Complaint  Patient presents with  . Abdominal Pain     OB History    Grav Para Term Preterm Abortions TAB SAB Ect Mult Living   2 1 1  0 0 0 0 0 0 1      Past Medical History  Diagnosis Date  . Seasonal allergies   . Asthma     childhood  . Pyelonephritis     DURING AND BEFORE PREG;WAS HOSPITALIZED PREVIOUSLY  . Infection     YEAST INF;NOT FREQ  . Headache     SINCE CURRENT PREGNANCY  . Polyhydramnios 2010    WAS GETTING REG NST'S DURING PREG  . Pregnancy induced hypertension 2010    NO MEDS;HAD INCREASED STESS    Past Surgical History  Procedure Date  . Vaginal delivery     Family History  Problem Relation Age of Onset  . Asthma Sister   . Asthma Son   . Asthma Brother     X 2  . Nephrolithiasis Mother     HAS HAD 2-3 SURGERIES TO REMOVE    History  Substance Use Topics  . Smoking status: Former Smoker    Types: Cigarettes    Quit date: 05/20/2011  . Smokeless tobacco: Never Used  . Alcohol Use: No    Allergies: No Known Allergies  Prescriptions prior to admission  Medication Sig Dispense Refill  . acetaminophen (TYLENOL) 500 MG tablet Take 500 mg by mouth every 6 (six) hours as needed. For pain         Physical Exam   Blood pressure 123/75, pulse 92, temperature 97.9 F (36.6 C), temperature source Oral, resp. rate 18, height 5\' 6"  (1.676 m), weight 151 lb 12.8 oz (68.856 kg), last menstrual  period 05/06/2011.  Chest clear Heart RRR without murmur Abd gravid, NT, no rebound or guarding. Pelvic--cervix closed, long, no d/c in vault Ext WNL Negative CVAT  FHR reactive, no decels No UCs  Results for orders placed during the hospital encounter of 10/02/11 (from the past 24 hour(s))  URINALYSIS, ROUTINE W REFLEX MICROSCOPIC     Status: Abnormal   Collection Time   10/02/11 12:55 PM      Component Value Range   Color, Urine YELLOW  YELLOW   APPearance CLOUDY (*) CLEAR   Specific Gravity, Urine 1.020  1.005 - 1.030   pH 8.0  5.0 - 8.0   Glucose, UA NEGATIVE  NEGATIVE mg/dL   Hgb urine dipstick NEGATIVE  NEGATIVE   Bilirubin Urine NEGATIVE  NEGATIVE   Ketones, ur NEGATIVE  NEGATIVE mg/dL   Protein, ur NEGATIVE  NEGATIVE mg/dL   Urobilinogen, UA 0.2  0.0 - 1.0 mg/dL   Nitrite NEGATIVE  NEGATIVE   Leukocytes, UA NEGATIVE  NEGATIVE     ED Course  IUP at 25 weeks Right-sided round ligament pain  Plan: D/C home with RLP instructions. Rx Motrin  600 mg po q 6 hours x 2-3 days, then prn to 26 weeks. Rx Flexeril 10 mg po TID prn RLP. Keep scheduled appointment at [facility] 7/11 or call prn.  [name], CNM, [place] 10/02/11 2:20p

## 2011-10-02 NOTE — Discharge Instructions (Signed)
Take Ibuprophen every 6 hours for next 2-3 days, then as needed until 26 weeks for round ligament pain Take Flexeril every 8 hours as needed in addition to Ibuprophen.   Round Ligament Pain The round ligament is made up of muscle and fibrous tissue. It is attached to the uterus near the fallopian tube. The round ligament is located on both sides of the uterus and helps support the position of the uterus. It usually begins in the second trimester of pregnancy when the uterus comes out of the pelvis. The pain can come and go until the baby is delivered. Round ligament pain is not a serious problem and does not cause harm to the baby. CAUSE During pregnancy the uterus grows the most from the second trimester to delivery. As it grows, it stretches and slightly twists the round ligaments. When the uterus leans from one side to the other, the round ligament on the opposite side pulls and stretches. This can cause pain. SYMPTOMS  Pain can occur on one side or both sides. The pain is usually a short, sharp, and pinching-like. Sometimes it can be a dull, lingering and aching pain. The pain is located in the lower side of the abdomen or in the groin. The pain is internal and usually starts deep in the groin and moves up to the outside of the hip area. Pain can occur with:  Sudden change in position like getting out of bed or a chair.   Rolling over in bed.   Coughing or sneezing.   Walking too much.   Any type of physical activity.  DIAGNOSIS  Your caregiver will make sure there are no serious problems causing the pain. When nothing serious is found, the symptoms usually indicate that the pain is from the round ligament. TREATMENT   Sit down and relax when the pain starts.   Flex your knees up to your belly.   Lay on your side with a pillow under your belly (abdomen) and another one between your legs.   Sit in a hot bath for 15 to 20 minutes or until the pain goes away.  HOME CARE INSTRUCTIONS     Only take over-the-counter or prescriptions medicines for pain, discomfort or fever as directed by your caregiver.   Sit and stand slowly.   Avoid long walks if it causes pain.   Stop or lessen your physical activities if it causes pain.  SEEK MEDICAL CARE IF:   The pain does not go away with any of your treatment.   You need stronger medication for the pain.   You develop back pain that you did not have before with the side pain.  SEEK IMMEDIATE MEDICAL CARE IF:   You develop a temperature of 102 F (38.9 C) or higher.   You develop uterine contractions.   You develop vaginal bleeding.   You develop nausea, vomiting or diarrhea.   You develop chills.   You have pain when you urinate.  Document Released: 12/29/2007 Document Revised: 03/10/2011 Document Reviewed: 12/29/2007 [facility] Patient Information 2012 [place], [place].

## 2011-10-02 NOTE — MAU Note (Signed)
Pt reports she started having pelvic pain 4-5 days ago. Increasted pressure to pelvic bone like "something is hitting my pelvic bone". Taking tylenol without relief.

## 2011-10-04 LAB — US OB COMP + 14 WK

## 2011-10-13 ENCOUNTER — Other Ambulatory Visit: Payer: Medicaid Other

## 2011-10-13 ENCOUNTER — Encounter: Payer: Medicaid Other | Admitting: Obstetrics and Gynecology

## 2011-10-14 ENCOUNTER — Other Ambulatory Visit: Payer: Medicaid Other

## 2011-10-14 ENCOUNTER — Encounter: Payer: Self-pay | Admitting: Obstetrics and Gynecology

## 2011-10-14 ENCOUNTER — Ambulatory Visit (INDEPENDENT_AMBULATORY_CARE_PROVIDER_SITE_OTHER): Payer: Medicaid Other | Admitting: Obstetrics and Gynecology

## 2011-10-14 VITALS — BP 110/68 | Wt 155.0 lb

## 2011-10-14 DIAGNOSIS — Z3490 Encounter for supervision of normal pregnancy, unspecified, unspecified trimester: Secondary | ICD-10-CM

## 2011-10-14 DIAGNOSIS — Z331 Pregnant state, incidental: Secondary | ICD-10-CM

## 2011-10-14 LAB — CBC
Hemoglobin: 11.3 g/dL — ABNORMAL LOW (ref 12.0–15.0)
MCH: 30 pg (ref 26.0–34.0)
Platelets: 234 10*3/uL (ref 150–400)
RBC: 3.77 MIL/uL — ABNORMAL LOW (ref 3.87–5.11)
WBC: 11.9 10*3/uL — ABNORMAL HIGH (ref 4.0–10.5)

## 2011-10-14 NOTE — Progress Notes (Signed)
No complaints However requests a note for [employer] because the living conditions are not good.  She gave note about son's asthma and was hoping a pregnancy note might be helpful. Glucola today RTO 2wks FKCs

## 2011-10-18 LAB — GLUCOSE TOLERANCE, 1 HOUR

## 2011-10-18 LAB — GLUCOSE TOLERANCE, 1 HOUR (50G) W/O FASTING: Glucose, 1 Hour GTT: 93 mg/dL (ref 70–140)

## 2011-10-28 ENCOUNTER — Ambulatory Visit (INDEPENDENT_AMBULATORY_CARE_PROVIDER_SITE_OTHER): Payer: Medicaid Other | Admitting: Obstetrics and Gynecology

## 2011-10-28 ENCOUNTER — Encounter: Payer: Self-pay | Admitting: Obstetrics and Gynecology

## 2011-10-28 VITALS — BP 106/58 | Wt 160.0 lb

## 2011-10-28 DIAGNOSIS — Z3480 Encounter for supervision of other normal pregnancy, unspecified trimester: Secondary | ICD-10-CM

## 2011-10-28 NOTE — Patient Instructions (Signed)
Fetal Movement Counts Patient Name: __________________________________________________ Patient Due Date: ____________________ Kick counts is highly recommended in high risk pregnancies, but it is a good idea for every pregnant woman to do. Start counting fetal movements at 28 weeks of the pregnancy. Fetal movements increase after eating a full meal or eating or drinking something sweet (the blood sugar is higher). It is also important to drink plenty of fluids (well hydrated) before doing the count. Lie on your left side because it helps with the circulation or you can sit in a comfortable chair with your arms over your belly (abdomen) with no distractions around you. DOING THE COUNT  Try to do the count the same time of day each time you do it.   Mark the day and time, then see how long it takes for you to feel 10 movements (kicks, flutters, swishes, rolls). You should have at least 10 movements within 2 hours. You will most likely feel 10 movements in much less than 2 hours. If you do not, wait an hour and count again. After a couple of days you will see a pattern.   What you are looking for is a change in the pattern or not enough counts in 2 hours. Is it taking longer in time to reach 10 movements?  SEEK MEDICAL CARE IF:  You feel less than 10 counts in 2 hours. Tried twice.   No movement in one hour.   The pattern is changing or taking longer each day to reach 10 counts in 2 hours.   You feel the baby is not moving as it usually does.  Date: ____________ Movements: ____________ Start time: ____________ Finish time: ____________  Date: ____________ Movements: ____________ Start time: ____________ Finish time: ____________ Date: ____________ Movements: ____________ Start time: ____________ Finish time: ____________ Date: ____________ Movements: ____________ Start time: ____________ Finish time: ____________ Date: ____________ Movements: ____________ Start time: ____________ Finish time:  ____________ Date: ____________ Movements: ____________ Start time: ____________ Finish time: ____________ Date: ____________ Movements: ____________ Start time: ____________ Finish time: ____________ Date: ____________ Movements: ____________ Start time: ____________ Finish time: ____________  Date: ____________ Movements: ____________ Start time: ____________ Finish time: ____________ Date: ____________ Movements: ____________ Start time: ____________ Finish time: ____________ Date: ____________ Movements: ____________ Start time: ____________ Finish time: ____________ Date: ____________ Movements: ____________ Start time: ____________ Finish time: ____________ Date: ____________ Movements: ____________ Start time: ____________ Finish time: ____________ Date: ____________ Movements: ____________ Start time: ____________ Finish time: ____________ Date: ____________ Movements: ____________ Start time: ____________ Finish time: ____________  Date: ____________ Movements: ____________ Start time: ____________ Finish time: ____________ Date: ____________ Movements: ____________ Start time: ____________ Finish time: ____________ Date: ____________ Movements: ____________ Start time: ____________ Finish time: ____________ Date: ____________ Movements: ____________ Start time: ____________ Finish time: ____________ Date: ____________ Movements: ____________ Start time: ____________ Finish time: ____________ Date: ____________ Movements: ____________ Start time: ____________ Finish time: ____________ Date: ____________ Movements: ____________ Start time: ____________ Finish time: ____________  Date: ____________ Movements: ____________ Start time: ____________ Finish time: ____________ Date: ____________ Movements: ____________ Start time: ____________ Finish time: ____________ Date: ____________ Movements: ____________ Start time: ____________ Finish time: ____________ Date: ____________ Movements:  ____________ Start time: ____________ Finish time: ____________ Date: ____________ Movements: ____________ Start time: ____________ Finish time: ____________ Date: ____________ Movements: ____________ Start time: ____________ Finish time: ____________ Date: ____________ Movements: ____________ Start time: ____________ Finish time: ____________  Date: ____________ Movements: ____________ Start time: ____________ Finish time: ____________ Date: ____________ Movements: ____________ Start time: ____________ Finish time: ____________ Date: ____________ Movements: ____________ Start time:   ____________ Finish time: ____________ Date: ____________ Movements: ____________ Start time: ____________ Finish time: ____________ Date: ____________ Movements: ____________ Start time: ____________ Finish time: ____________ Date: ____________ Movements: ____________ Start time: ____________ Finish time: ____________ Date: ____________ Movements: ____________ Start time: ____________ Finish time: ____________  Date: ____________ Movements: ____________ Start time: ____________ Finish time: ____________ Date: ____________ Movements: ____________ Start time: ____________ Finish time: ____________ Date: ____________ Movements: ____________ Start time: ____________ Finish time: ____________ Date: ____________ Movements: ____________ Start time: ____________ Finish time: ____________ Date: ____________ Movements: ____________ Start time: ____________ Finish time: ____________ Date: ____________ Movements: ____________ Start time: ____________ Finish time: ____________ Date: ____________ Movements: ____________ Start time: ____________ Finish time: ____________  Date: ____________ Movements: ____________ Start time: ____________ Finish time: ____________ Date: ____________ Movements: ____________ Start time: ____________ Finish time: ____________ Date: ____________ Movements: ____________ Start time: ____________ Finish  time: ____________ Date: ____________ Movements: ____________ Start time: ____________ Finish time: ____________ Date: ____________ Movements: ____________ Start time: ____________ Finish time: ____________ Date: ____________ Movements: ____________ Start time: ____________ Finish time: ____________ Date: ____________ Movements: ____________ Start time: ____________ Finish time: ____________  Date: ____________ Movements: ____________ Start time: ____________ Finish time: ____________ Date: ____________ Movements: ____________ Start time: ____________ Finish time: ____________ Date: ____________ Movements: ____________ Start time: ____________ Finish time: ____________ Date: ____________ Movements: ____________ Start time: ____________ Finish time: ____________ Date: ____________ Movements: ____________ Start time: ____________ Finish time: ____________ Date: ____________ Movements: ____________ Start time: ____________ Finish time: ____________ Document Released: 04/20/2006 Document Revised: 03/10/2011 Document Reviewed: 10/21/2008 [employer] Patient Information 2012 [employer]. 

## 2011-10-28 NOTE — Progress Notes (Signed)
C/o tiredness and mood swings. Unable to leave urine sample.

## 2011-10-28 NOTE — Progress Notes (Signed)
Pt denies any deep depression or HI or SI.   Pt told to stop motrin.  She takes it for round ligament pain [facility] reviewed glucola normal HGB 11.3 RPR NR

## 2011-11-10 ENCOUNTER — Ambulatory Visit (INDEPENDENT_AMBULATORY_CARE_PROVIDER_SITE_OTHER): Payer: Medicaid Other | Admitting: Obstetrics and Gynecology

## 2011-11-10 ENCOUNTER — Encounter: Payer: Self-pay | Admitting: Obstetrics and Gynecology

## 2011-11-10 VITALS — BP 102/58 | Wt 161.0 lb

## 2011-11-10 DIAGNOSIS — F17200 Nicotine dependence, unspecified, uncomplicated: Secondary | ICD-10-CM

## 2011-11-10 DIAGNOSIS — O09299 Supervision of pregnancy with other poor reproductive or obstetric history, unspecified trimester: Secondary | ICD-10-CM

## 2011-11-10 DIAGNOSIS — Z87440 Personal history of urinary (tract) infections: Secondary | ICD-10-CM

## 2011-11-10 DIAGNOSIS — O09899 Supervision of other high risk pregnancies, unspecified trimester: Secondary | ICD-10-CM

## 2011-11-10 NOTE — Progress Notes (Signed)
Pt c/o braxton hicks contractions. Rare during the day.  Never regular. 1 hr glu nl Checklist reviewed

## 2011-11-10 NOTE — Patient Instructions (Signed)
Nexplanon info

## 2011-11-24 ENCOUNTER — Ambulatory Visit (INDEPENDENT_AMBULATORY_CARE_PROVIDER_SITE_OTHER): Payer: Medicaid Other | Admitting: Obstetrics and Gynecology

## 2011-11-24 ENCOUNTER — Encounter: Payer: Self-pay | Admitting: Obstetrics and Gynecology

## 2011-11-24 VITALS — BP 120/62 | Wt 166.0 lb

## 2011-11-24 DIAGNOSIS — Z87440 Personal history of urinary (tract) infections: Secondary | ICD-10-CM

## 2011-11-24 MED ORDER — CYCLOBENZAPRINE HCL 10 MG PO TABS: ORAL_TABLET | ORAL | Status: DC

## 2011-11-24 NOTE — Patient Instructions (Signed)

## 2011-11-24 NOTE — Progress Notes (Signed)
[redacted]w[redacted]d Pt c/o lower back pain, inability to sleep and night.flexeril and tylenol recommended.  Also white vaginal d/c with no odor, no itching or burning  1 hr glu nl Hbg 11.3

## 2011-12-08 ENCOUNTER — Ambulatory Visit (INDEPENDENT_AMBULATORY_CARE_PROVIDER_SITE_OTHER): Payer: Medicaid Other | Admitting: Obstetrics and Gynecology

## 2011-12-08 ENCOUNTER — Encounter: Payer: Self-pay | Admitting: Obstetrics and Gynecology

## 2011-12-08 VITALS — BP 110/66 | Wt 169.0 lb

## 2011-12-08 DIAGNOSIS — Z3490 Encounter for supervision of normal pregnancy, unspecified, unspecified trimester: Secondary | ICD-10-CM

## 2011-12-08 DIAGNOSIS — Z331 Pregnant state, incidental: Secondary | ICD-10-CM

## 2011-12-08 NOTE — Progress Notes (Signed)
[redacted]w[redacted]d No change in vaginal secretions. FM+ No complaints. ROB x 2 weeks

## 2011-12-08 NOTE — Progress Notes (Signed)
Needs GBS and GC and Chlamydia next visit.

## 2011-12-08 NOTE — Progress Notes (Signed)
No concerns per pt 

## 2011-12-18 ENCOUNTER — Encounter (HOSPITAL_COMMUNITY): Payer: Self-pay

## 2011-12-18 ENCOUNTER — Inpatient Hospital Stay (HOSPITAL_COMMUNITY)
Admission: AD | Admit: 2011-12-18 | Discharge: 2011-12-18 | Disposition: A | Discharge status: Home / Self Care | Payer: Medicaid Other | Source: Ambulatory Visit | Attending: Obstetrics and Gynecology | Admitting: Obstetrics and Gynecology

## 2011-12-18 DIAGNOSIS — M545 Low back pain, unspecified: Secondary | ICD-10-CM | POA: Insufficient documentation

## 2011-12-18 DIAGNOSIS — N949 Unspecified condition associated with female genital organs and menstrual cycle: Secondary | ICD-10-CM

## 2011-12-18 DIAGNOSIS — Z3687 Encounter for antenatal screening for uncertain dates: Secondary | ICD-10-CM | POA: Insufficient documentation

## 2011-12-18 DIAGNOSIS — R109 Unspecified abdominal pain: Secondary | ICD-10-CM | POA: Insufficient documentation

## 2011-12-18 DIAGNOSIS — O0930 Supervision of pregnancy with insufficient antenatal care, unspecified trimester: Secondary | ICD-10-CM

## 2011-12-18 DIAGNOSIS — O4703 False labor before 37 completed weeks of gestation, third trimester: Secondary | ICD-10-CM

## 2011-12-18 DIAGNOSIS — Z87440 Personal history of urinary (tract) infections: Secondary | ICD-10-CM

## 2011-12-18 DIAGNOSIS — O99891 Other specified diseases and conditions complicating pregnancy: Secondary | ICD-10-CM | POA: Insufficient documentation

## 2011-12-18 DIAGNOSIS — O479 False labor, unspecified: Secondary | ICD-10-CM

## 2011-12-18 DIAGNOSIS — N926 Irregular menstruation, unspecified: Secondary | ICD-10-CM

## 2011-12-18 DIAGNOSIS — O09899 Supervision of other high risk pregnancies, unspecified trimester: Secondary | ICD-10-CM

## 2011-12-18 HISTORY — DX: Encounter for antenatal screening for uncertain dates: Z36.87

## 2011-12-18 HISTORY — DX: Irregular menstruation, unspecified: N92.6

## 2011-12-18 LAB — AMNISURE RUPTURE OF MEMBRANE (ROM) NOT AT ARMC: Amnisure ROM: NEGATIVE

## 2011-12-18 MED ORDER — ZOLPIDEM TARTRATE 5 MG PO TABS
5.0000 mg | ORAL_TABLET | Freq: Once | ORAL | Status: AC
  Administered 2011-12-18: 5 mg via ORAL
  Filled 2011-12-18: qty 1

## 2011-12-18 NOTE — MAU Provider Note (Signed)
History   Theresa Lawson is a 21y.o. BF at [redacted]w[redacted]d who presents for r/o ROM with CC of 2 gushes of clear fluid around 2030, and intermittent leakage since.  No VB.  Denies ctxs, but some lower abdominal and lower back pain.  No fever or chills.  No resp or gi c/o's.  No UTI or PIH s/s.  GFM.  Did not wear a peripad to MAU.  Accompanied by her s.o.  Does c/o of easily awaking during middle of night and difficulty going back to sleep.   Pregnancy r/f: .Marland Kitchen Patient Active Problem List  Diagnosis  . ANEMIA  . TOBACCO ABUSE  . ALLERGIC RHINITIS  . ASTHMA, CHILDHOOD  . VAGINAL DISCHARGE  . HEADACHE  . Prior pregnancy complicated by PIH, antepartum  . Hx of pyelonephritis during pregnancy  . Late prenatal care  . Round ligament pain  . Irregular periods/menstrual cycles  . Theresa Lawson LMP   CSN: [id number]  Arrival date and time: 12/18/11 2044   None     Chief Complaint  Patient presents with  . Rupture of Membranes   HPI  OB History    Grav Para Term Preterm Abortions TAB SAB Ect Mult Living   2 1 1  0 0 0 0 0 0 1      Past Medical History  Diagnosis Date  . Seasonal allergies   . Asthma     childhood  . Pyelonephritis     DURING AND BEFORE PREG;WAS HOSPITALIZED PREVIOUSLY  . Infection     YEAST INF;NOT FREQ  . Headache     SINCE CURRENT PREGNANCY  . Polyhydramnios 2010    WAS GETTING REG NST'S DURING PREG  . Pregnancy induced hypertension 2010    NO MEDS;HAD INCREASED STESS  . Irregular periods/menstrual cycles 12/18/2011  . Unsure LMP 12/18/2011    EDC by [redacted]w[redacted]d u/s at [facility]    Past Surgical History  Procedure Date  . Vaginal delivery     Family History  Problem Relation Age of Onset  . Asthma Sister   . Asthma Son   . Asthma Brother     X 2  . Nephrolithiasis Mother     HAS HAD 2-3 SURGERIES TO REMOVE    History  Substance Use Topics  . Smoking status: Former Smoker    Types: Cigarettes    Quit date: 05/20/2011  . Smokeless tobacco: Never Used  . Alcohol Use:  No    Allergies: No Known Allergies  Prescriptions prior to admission  Medication Sig Dispense Refill  . Prenatal Vit-Fe Fumarate-FA (MULTIVITAMIN-PRENATAL) 27-0.8 MG TABS Take 1 tablet by mouth daily.        ROS--see HPI above Physical Exam   Blood pressure 135/87, pulse 101, temperature 98.6 F (37 C), temperature source Oral, resp. rate 20, height 5\' 4"  (1.626 m), weight 169 lb (76.658 kg), last menstrual period 05/06/2011, SpO2 100.00%. ..  Results for orders placed during the hospital encounter of 12/18/11 (from the past 24 hour(s))  AMNISURE RUPTURE OF MEMBRANE (ROM)     Status: Normal   Collection Time   12/18/11 10:08 PM      Component Value Range   Amnisure ROM NEGATIVE     Physical Exam  Constitutional: She is oriented to person, place, and time. She appears well-developed and well-nourished. No distress.  HENT:  Head: Normocephalic and atraumatic.  Eyes: Pupils are equal, round, and reactive to light.  Cardiovascular: Normal rate.   Respiratory: Effort normal.  GI: Soft.  gravid  Genitourinary:       SSE:  sm amt of thin white d/c in vault; no odor. No bleeding.  cx very posterior.  Ext os 1cm/int os FT/25%/-2  Neurological: She is alert and oriented to person, place, and time.  Skin: Skin is warm and dry.  Psychiatric: She has a normal mood and affect. Her behavior is normal. Judgment and thought content normal.  EFM:  135, reactive, no decels, moderate variability TOCO:  occ'l ripple/ctx  MAU Course  Procedures 1. amnisure 2.  Gc/ct 3. GBS  4. NST  Assessment and Plan  1. [redacted]w[redacted]d 2.  No s/s of ROM and amnisure negative 3.  No s/s of labor 4.  Reactive NST  1.  D/c home w/ leakage and labor precautions; f/u 12/22/11, or prn.  Call or return if continued gushes or unexplained LOF; also report fever, or any s/s of infection.  Rev'd FKC. 2.  Ambien 5mg  po x1 given at time of d/c for insomnia   Kla Bily H 12/18/2011, 11:15 PM

## 2011-12-18 NOTE — MAU Note (Signed)
Pt reports ? Leaking fluid since 2030, some abd/back pain

## 2011-12-20 LAB — GC/CHLAMYDIA PROBE AMP, GENITAL
Chlamydia, DNA Probe: NEGATIVE
GC Probe Amp, Genital: NEGATIVE

## 2011-12-21 LAB — CULTURE, BETA STREP (GROUP B ONLY)

## 2011-12-22 ENCOUNTER — Encounter: Payer: Self-pay | Admitting: Obstetrics and Gynecology

## 2011-12-22 ENCOUNTER — Ambulatory Visit (INDEPENDENT_AMBULATORY_CARE_PROVIDER_SITE_OTHER): Payer: Medicaid Other | Admitting: Obstetrics and Gynecology

## 2011-12-22 VITALS — BP 114/62 | Wt 171.0 lb

## 2011-12-22 DIAGNOSIS — Z331 Pregnant state, incidental: Secondary | ICD-10-CM

## 2011-12-22 NOTE — Progress Notes (Signed)
Patient ID: Theresa Lawson, female   DOB: 1990-07-23, 21 y.o.   MRN: [id number] [redacted]w[redacted]d GBS neg Reviewed s/s uc, srom, vag bleeding, daily fetal kick counts to report, comfort measures. Encouragged 8 water daily and frequent voids. Lavera Guise, CNM

## 2011-12-22 NOTE — Progress Notes (Signed)
[redacted]w[redacted]d Pt had neg GBS done at hosp. 12/18/2011

## 2011-12-29 ENCOUNTER — Encounter: Payer: Medicaid Other | Admitting: Obstetrics and Gynecology

## 2012-01-02 ENCOUNTER — Encounter: Payer: Self-pay | Admitting: Obstetrics and Gynecology

## 2012-01-02 ENCOUNTER — Ambulatory Visit (INDEPENDENT_AMBULATORY_CARE_PROVIDER_SITE_OTHER): Payer: Medicaid Other | Admitting: Obstetrics and Gynecology

## 2012-01-02 VITALS — BP 110/64 | Wt 171.0 lb

## 2012-01-02 DIAGNOSIS — Z331 Pregnant state, incidental: Secondary | ICD-10-CM

## 2012-01-02 DIAGNOSIS — Z3490 Encounter for supervision of normal pregnancy, unspecified, unspecified trimester: Secondary | ICD-10-CM

## 2012-01-02 NOTE — Progress Notes (Signed)
[redacted]w[redacted]d No complaints No change vaginal secretions SVE: Cx 1 and thick, posterior FM+ FH - to dates ROB x 1 week

## 2012-01-02 NOTE — Progress Notes (Signed)
[redacted]w[redacted]d Request cervix check.  

## 2012-01-10 ENCOUNTER — Ambulatory Visit (INDEPENDENT_AMBULATORY_CARE_PROVIDER_SITE_OTHER): Payer: Medicaid Other

## 2012-01-10 VITALS — BP 104/60 | Wt 171.0 lb

## 2012-01-10 DIAGNOSIS — Z331 Pregnant state, incidental: Secondary | ICD-10-CM

## 2012-01-10 NOTE — Progress Notes (Signed)
[redacted]w[redacted]d; Ready!  Rec'd flu shot--declined.  Rev'd FKC and labor s/s.

## 2012-01-10 NOTE — Progress Notes (Signed)
Pt stated no issues today/ pt wants a cervix check today.pt declined the flu shot

## 2012-01-12 ENCOUNTER — Telehealth (HOSPITAL_COMMUNITY): Payer: Self-pay | Admitting: *Deleted

## 2012-01-12 NOTE — Telephone Encounter (Signed)
Preadmission screen  

## 2012-01-17 ENCOUNTER — Telehealth: Payer: Self-pay | Admitting: Obstetrics and Gynecology

## 2012-01-17 ENCOUNTER — Ambulatory Visit (INDEPENDENT_AMBULATORY_CARE_PROVIDER_SITE_OTHER): Payer: Medicaid Other | Admitting: Obstetrics and Gynecology

## 2012-01-17 VITALS — BP 110/68 | Wt 171.0 lb

## 2012-01-17 DIAGNOSIS — Z331 Pregnant state, incidental: Secondary | ICD-10-CM

## 2012-01-17 NOTE — Progress Notes (Signed)
[redacted]w[redacted]d No complaints. Desires IOL. Reviewed and aware of increased risk of C/S. Will schedule first available

## 2012-01-17 NOTE — Telephone Encounter (Signed)
Induction scheduled for 01/19/12 @ 6:30am with AVS/VL. - Adrianne Pridgen

## 2012-01-17 NOTE — Progress Notes (Signed)
Pt stated no issues today.pt wants a cervix check today. 

## 2012-01-18 ENCOUNTER — Encounter (HOSPITAL_COMMUNITY): Payer: Self-pay | Admitting: *Deleted

## 2012-01-18 ENCOUNTER — Telehealth: Payer: Self-pay | Admitting: Obstetrics and Gynecology

## 2012-01-18 ENCOUNTER — Telehealth (HOSPITAL_COMMUNITY): Payer: Self-pay | Admitting: *Deleted

## 2012-01-18 NOTE — Telephone Encounter (Signed)
TC from pt c/o abdominal pain but states it doesn't feel like ctx, just uncomfortable, also feels the baby isn't moving much. Denies any VB or LOF. Instructed pt to come to MAU for eval.

## 2012-01-18 NOTE — Telephone Encounter (Signed)
Preadmission screen  

## 2012-01-19 ENCOUNTER — Inpatient Hospital Stay (HOSPITAL_COMMUNITY): Payer: Medicaid Other | Admitting: Anesthesiology

## 2012-01-19 ENCOUNTER — Encounter (HOSPITAL_COMMUNITY): Payer: Self-pay

## 2012-01-19 ENCOUNTER — Encounter (HOSPITAL_COMMUNITY): Payer: Self-pay | Admitting: Anesthesiology

## 2012-01-19 ENCOUNTER — Inpatient Hospital Stay (HOSPITAL_COMMUNITY)
Admission: RE | Admit: 2012-01-19 | Discharge: 2012-01-21 | DRG: 775 | Disposition: A | Discharge status: Home / Self Care | Payer: Medicaid Other | Source: Ambulatory Visit | Attending: Obstetrics and Gynecology | Admitting: Obstetrics and Gynecology

## 2012-01-19 VITALS — BP 116/66 | HR 77 | Temp 98.0°F | Resp 18 | Ht 64.0 in | Wt 171.0 lb

## 2012-01-19 DIAGNOSIS — Z3687 Encounter for antenatal screening for uncertain dates: Secondary | ICD-10-CM

## 2012-01-19 DIAGNOSIS — Z87440 Personal history of urinary (tract) infections: Secondary | ICD-10-CM

## 2012-01-19 DIAGNOSIS — D649 Anemia, unspecified: Secondary | ICD-10-CM | POA: Diagnosis present

## 2012-01-19 DIAGNOSIS — O480 Post-term pregnancy: Principal | ICD-10-CM | POA: Diagnosis present

## 2012-01-19 DIAGNOSIS — O09899 Supervision of other high risk pregnancies, unspecified trimester: Secondary | ICD-10-CM

## 2012-01-19 DIAGNOSIS — O0930 Supervision of pregnancy with insufficient antenatal care, unspecified trimester: Secondary | ICD-10-CM

## 2012-01-19 DIAGNOSIS — N949 Unspecified condition associated with female genital organs and menstrual cycle: Secondary | ICD-10-CM

## 2012-01-19 DIAGNOSIS — Z9889 Other specified postprocedural states: Secondary | ICD-10-CM | POA: Diagnosis present

## 2012-01-19 DIAGNOSIS — O9902 Anemia complicating childbirth: Secondary | ICD-10-CM | POA: Diagnosis present

## 2012-01-19 LAB — CBC
Platelets: 218 10*3/uL (ref 150–400)
RBC: 3.47 MIL/uL — ABNORMAL LOW (ref 3.87–5.11)
WBC: 13 10*3/uL — ABNORMAL HIGH (ref 4.0–10.5)

## 2012-01-19 LAB — RPR: RPR Ser Ql: NONREACTIVE

## 2012-01-19 MED ORDER — FLEET ENEMA 7-19 GM/118ML RE ENEM: 1.0000 | ENEMA | RECTAL | Status: DC | PRN

## 2012-01-19 MED ORDER — OXYTOCIN 40 UNITS IN LACTATED RINGERS INFUSION - SIMPLE MED
1.0000 m[IU]/min | INTRAVENOUS | Status: DC
  Administered 2012-01-19: 1 m[IU]/min via INTRAVENOUS
  Filled 2012-01-19: qty 1000

## 2012-01-19 MED ORDER — ONDANSETRON HCL 4 MG/2ML IJ SOLN
4.0000 mg | Freq: Four times a day (QID) | INTRAMUSCULAR | Status: DC | PRN
  Administered 2012-01-19: 4 mg via INTRAVENOUS
  Filled 2012-01-19: qty 2

## 2012-01-19 MED ORDER — LIDOCAINE HCL (PF) 1 % IJ SOLN: 30.0000 mL | INTRAMUSCULAR | Status: DC | PRN

## 2012-01-19 MED ORDER — PHENYLEPHRINE 40 MCG/ML (10ML) SYRINGE FOR IV PUSH (FOR BLOOD PRESSURE SUPPORT)
80.0000 ug | PREFILLED_SYRINGE | INTRAVENOUS | Status: DC | PRN
  Filled 2012-01-19: qty 5

## 2012-01-19 MED ORDER — CITRIC ACID-SODIUM CITRATE 334-500 MG/5ML PO SOLN: 30.0000 mL | ORAL | Status: DC | PRN

## 2012-01-19 MED ORDER — IBUPROFEN 600 MG PO TABS: 600.0000 mg | ORAL_TABLET | Freq: Four times a day (QID) | ORAL | Status: DC | PRN

## 2012-01-19 MED ORDER — OXYTOCIN BOLUS FROM INFUSION
500.0000 mL | Freq: Once | INTRAVENOUS | Status: DC
  Filled 2012-01-19: qty 500

## 2012-01-19 MED ORDER — LACTATED RINGERS IV SOLN
INTRAVENOUS | Status: DC
  Administered 2012-01-19 (×3): via INTRAVENOUS

## 2012-01-19 MED ORDER — DIPHENHYDRAMINE HCL 50 MG/ML IJ SOLN: 12.5000 mg | INTRAMUSCULAR | Status: DC | PRN

## 2012-01-19 MED ORDER — TERBUTALINE SULFATE 1 MG/ML IJ SOLN: 0.2500 mg | Freq: Once | INTRAMUSCULAR | Status: DC | PRN

## 2012-01-19 MED ORDER — LACTATED RINGERS IV SOLN
500.0000 mL | Freq: Once | INTRAVENOUS | Status: AC
  Administered 2012-01-19: 500 mL via INTRAVENOUS

## 2012-01-19 MED ORDER — OXYTOCIN 40 UNITS IN LACTATED RINGERS INFUSION - SIMPLE MED: 62.5000 mL/h | Freq: Once | INTRAVENOUS | Status: DC

## 2012-01-19 MED ORDER — OXYCODONE-ACETAMINOPHEN 5-325 MG PO TABS
1.0000 | ORAL_TABLET | ORAL | Status: DC | PRN
  Administered 2012-01-19: 1 via ORAL
  Filled 2012-01-19: qty 1

## 2012-01-19 MED ORDER — LACTATED RINGERS IV SOLN: 500.0000 mL | INTRAVENOUS | Status: DC | PRN

## 2012-01-19 MED ORDER — PHENYLEPHRINE 40 MCG/ML (10ML) SYRINGE FOR IV PUSH (FOR BLOOD PRESSURE SUPPORT): 80.0000 ug | PREFILLED_SYRINGE | INTRAVENOUS | Status: DC | PRN

## 2012-01-19 MED ORDER — ACETAMINOPHEN 325 MG PO TABS: 650.0000 mg | ORAL_TABLET | ORAL | Status: DC | PRN

## 2012-01-19 MED ORDER — FENTANYL 2.5 MCG/ML BUPIVACAINE 1/10 % EPIDURAL INFUSION (WH - ANES)
14.0000 mL/h | INTRAMUSCULAR | Status: DC
  Filled 2012-01-19: qty 125

## 2012-01-19 MED ORDER — EPHEDRINE 5 MG/ML INJ
10.0000 mg | INTRAVENOUS | Status: DC | PRN
  Filled 2012-01-19: qty 4

## 2012-01-19 MED ORDER — EPHEDRINE 5 MG/ML INJ: 10.0000 mg | INTRAVENOUS | Status: DC | PRN

## 2012-01-19 NOTE — Anesthesia Procedure Notes (Signed)
Epidural Patient location during procedure: OB Start time: 01/19/2012 4:38 PM  Staffing Anesthesiologist: Ejay Lashley A. Performed by: anesthesiologist   Preanesthetic Checklist Completed: patient identified, site marked, surgical consent, pre-op evaluation, timeout performed, IV checked, risks and benefits discussed and monitors and equipment checked  Epidural Patient position: sitting Prep: site prepped and draped and DuraPrep Patient monitoring: continuous pulse ox and blood pressure Approach: midline Injection technique: LOR air  Needle:  Needle type: Tuohy  Needle gauge: 17 G Needle length: 9 cm and 9 Needle insertion depth: 5 cm cm Catheter type: closed end flexible Catheter size: 19 Gauge Catheter at skin depth: 10 cm Test dose: negative and Other  Assessment Events: blood not aspirated, injection not painful, no injection resistance, negative IV test and no paresthesia  Additional Notes Patient identified. Risks and benefits discussed including failed block, incomplete  Pain control, post dural puncture headache, nerve damage, paralysis, blood pressure Changes, nausea, vomiting, reactions to medications-both toxic and allergic and post Partum back pain. All questions were answered. Patient expressed understanding and wished to proceed. Sterile technique was used throughout procedure. Epidural site was Dressed with sterile barrier dressing. No paresthesias, signs of intravascular injection Or signs of intrathecal spread were encountered.  Patient was more comfortable after the epidural was dosed. Please see RN's note for documentation of vital signs and FHR which are stable.

## 2012-01-19 NOTE — Progress Notes (Signed)
Comfortable, some pressure. Having variables with each UC and need for evaluation for progress in labor. O: VSS BP 110/54  Pulse 80  Temp 98.1 F (36.7 C) (Oral)  Resp 18  Ht 5\' 4"  (1.626 m)  Wt 171 lb (77.565 kg)  BMI 29.35 kg/m2  SpO2 100%  LMP 05/06/2011       Fhts category 2 variables with each UC. Baseline 145 bpm      Abd soft between uc      Contractions 1 : 2 mins with Pitocin 18 milli units/ min      SVE: 10 /100%/+2,.  A: To commence pushing. Patient has strong urge to push. P: Expectant management towards NVSd viable female infant.  Earl Gala, CNM.

## 2012-01-19 NOTE — H&P (Signed)
Theresa Lawson is a 21 y.o. female presenting for induction of labor due to post-term and patient request.   Cervix was 3 cm, 50% at last office visit.  Patient Active Problem List  Diagnosis  . ANEMIA  . TOBACCO ABUSE  . ALLERGIC RHINITIS  . ASTHMA, CHILDHOOD  . VAGINAL DISCHARGE  . HEADACHE  . Prior pregnancy complicated by PIH, antepartum  . Hx of pyelonephritis during pregnancy  . Late prenatal care  . Round ligament pain  . Irregular periods/menstrual cycles  . Unsure LMP   History of present pregnancy: Patient entered care at 22 4/7 weeks.  EDC of 01/15/12 was established by [place] at 9 5/7 weeks at MAU.  Anatomy scan was done at her NOB visit, with normal findings and an anterior placenta.  No further ultrasounds were done.  Her prenatal course was essentially uncomplicated.  She was evaluated for back pain at approx 30 weeks and treated with Flexeril.  At her last evalution, she was 3 cm, 50% by Dr. Geralynn Rile patient requested induction.  R&B were reviewed with her by Dr. Estanislado Pandy.  History OB History    Grav Para Term Preterm Abortions TAB SAB Ect Mult Living   2 1 1  0 0 0 0 0 0 1    2010--SVB, female, 8+7, at [facility], induced due to postdates, epidural.  Past Medical History  Diagnosis Date  . Seasonal allergies   . Asthma     childhood  . Pyelonephritis     DURING AND BEFORE PREG;WAS HOSPITALIZED PREVIOUSLY  . Infection     YEAST INF;NOT FREQ  . Headache     SINCE CURRENT PREGNANCY  . Polyhydramnios 2010    WAS GETTING REG NST'S DURING PREG  . Pregnancy induced hypertension 2010    NO MEDS;HAD INCREASED STESS  . Irregular periods/menstrual cycles 12/18/2011  . Unsure LMP 12/18/2011    EDC by [redacted]w[redacted]d u/s at [facility]   Past Surgical History  Procedure Date  . Vaginal delivery    Family History: family history includes Asthma in her brother, sister, and son and Nephrolithiasis in her mother.  Social History:  reports that she quit smoking about 8 months ago. Her  smoking use included Cigarettes. She has never used smokeless tobacco. She reports that she does not drink alcohol or use illicit drugs.  FOB is Marcial Pacas, who is involved and supportive.   Prenatal Transfer Tool  Maternal Diabetes: No Genetic Screening: Normal Maternal Ultrasounds/Referrals: Normal Fetal Ultrasounds or other Referrals:  None Maternal Substance Abuse:  No Significant Maternal Medications:  None Significant Maternal Lab Results:  None Other Comments:  None  ROS:  Occasional contractions, +FM.    Blood pressure 113/74, pulse 110, temperature 98.1 F (36.7 C), temperature source Oral, resp. rate 20, height 5\' 4"  (1.626 m), weight 171 lb (77.565 kg), last menstrual period 05/06/2011. Exam Physical Exam  Chest clear Heart RRR without murmur Abd gravid, NT Pelvic--2-3 cm, 60%, vtx, -1, cervix soft Ext WNL  FHR Category 1 UCs occasional  Prenatal labs: ABO, Rh: O/POS/-- (06/13 1004) Antibody: NEG (06/13 1004) Rubella: 353.7 (06/13 1004) RPR: NON REAC (07/12 1551)  HBsAg: NEGATIVE (06/13 1004)  HIV: NON REACTIVE (06/13 1004)  GBS: Negative (09/15 0000)  Sickle cell negative Cultures negative at NOB and 36 weeks Hgb 11.3 at NOB, 10.1 at glucola visit Glucola WNL.  Assessment/Plan: IUP at 40 3/7 weeks Post term, desires induction GBS negative  Plan: Admitted to [facility] per consult with Dr. Stefano Gaul Routine  [facility] orders Plan pitocin induction, with AROM as labor progresses Epidural prn.   Fauna Neuner 01/19/2012, 7:45 AM

## 2012-01-19 NOTE — Progress Notes (Signed)
  Subjective: Doing well--more uncomfortable with UCs, but denies need for meds.  Objective: BP 111/69  Pulse 77  Temp 98.1 F (36.7 C) (Oral)  Resp 20  Ht 5\' 4"  (1.626 m)  Wt 171 lb (77.565 kg)  BMI 29.35 kg/m2  LMP 05/06/2011      FHT:  Category 1, no decels UC:  q 2-3 min, moderate SVE:   Dilation: 2.5 Effacement (%): 70 Station: -2;-1 Exam by:: [name], CNM BBOW with contraction. AROM--clear fluid. Pitocin remains on 11 mu/min.   Assessment / Plan: Induction--in early labor Will CTO s/p AROM.  Theresa Lawson 01/19/2012, 3:27 PM

## 2012-01-19 NOTE — Anesthesia Preprocedure Evaluation (Signed)
Anesthesia Evaluation  Patient identified by MRN, date of birth, ID band Patient awake    Reviewed: Allergy & Precautions, H&P , Patient's Chart, lab work & pertinent test results  Airway Mallampati: III TM Distance: >3 FB Neck ROM: full    Dental No notable dental hx. (+) Teeth Intact   Pulmonary neg pulmonary ROS, asthma , Current Smoker,  breath sounds clear to auscultation  Pulmonary exam normal       Cardiovascular negative cardio ROS  Rhythm:regular Rate:Normal  Hx/o PIH with previous pregnancy   Neuro/Psych  Headaches, PSYCHIATRIC DISORDERS    GI/Hepatic negative GI ROS, Neg liver ROS,   Endo/Other  negative endocrine ROS  Renal/GU Hx/o Pyelonephritis this pregnancy  negative genitourinary   Musculoskeletal   Abdominal Normal abdominal exam  (+)   Peds  Hematology  (+) anemia ,   Anesthesia Other Findings   Reproductive/Obstetrics (+) Pregnancy                           Anesthesia Physical Anesthesia Plan  ASA: II  Anesthesia Plan: Epidural   Post-op Pain Management:    Induction:   Airway Management Planned:   Additional Equipment:   Intra-op Plan:   Post-operative Plan:   Informed Consent: I have reviewed the patients History and Physical, chart, labs and discussed the procedure including the risks, benefits and alternatives for the proposed anesthesia with the patient or authorized representative who has indicated his/her understanding and acceptance.     Plan Discussed with: Anesthesiologist  Anesthesia Plan Comments:         Anesthesia Quick Evaluation

## 2012-01-19 NOTE — Progress Notes (Signed)
  Subjective: Epidural placed at approx 4:45p.  Patient more comfortable--some low pelvic pain.  Objective: BP 124/69  Pulse 86  Temp 97.8 F (36.6 C) (Oral)  Resp 18  Ht 5\' 4"  (1.626 m)  Wt 171 lb (77.565 kg)  BMI 29.35 kg/m2  SpO2 100%  LMP 05/06/2011      FHT:  Category 1, occasional early decels and mild variables UC:   q 2-3 min, moderate SVE:   Dilation: 4 Effacement (%): 70 Station: -2;-1 Exam by:: [name], CNM Leaking clear fluid. Cervix stretchy IUPC placed. Pitocin on 11 mu/min. Foley cath inserted.  Assessment / Plan: Progressive labor Will CTO  Theresa Lawson 01/19/2012, 5:53 PM

## 2012-01-19 NOTE — Progress Notes (Signed)
  Subjective: Resting comfortably.  Trying to nap.  Previous pubic discomfort resolved.  Objective: BP 103/59  Pulse 83  Temp 97.8 F (36.6 C) (Oral)  Resp 20  Ht 5\' 4"  (1.626 m)  Wt 171 lb (77.565 kg)  BMI 29.35 kg/m2  SpO2 100%  LMP 05/06/2011      FHT:  Category 1--very occasional quick variables UC:  q 2-3 min, moderate MVUs 220 since 6:30pm. On 12 mu pitocin  SVE:   Dilation: 4 Effacement (%): 70 Station: -2;-1 Exam by:: [name], CNM  Assessment / Plan: Will CTO--VE deferred at present.  [name] 01/19/2012, 6:35 PM

## 2012-01-19 NOTE — Progress Notes (Signed)
  Subjective: Feeling some increase in contractions, but not in significant pain.  Objective: BP 112/73  Pulse 78  Temp 98.4 F (36.9 C) (Oral)  Resp 18  Ht 5\' 4"  (1.626 m)  Wt 171 lb (77.565 kg)  BMI 29.35 kg/m2  LMP 05/06/2011      FHT:  Category 1, negative CST UC:   q 2-4 min, mild. On 6 mu/min pitocin  Assessment / Plan: Induction--on pitocin. Will CTO.  Hena Ewalt 01/19/2012, 11:01 AM

## 2012-01-19 NOTE — Progress Notes (Signed)
Comfortable, with epidural. O VSS BP 110/54  Pulse 80  Temp 98.1 F (36.7 C) (Oral)  Resp 18  Ht 5\' 4"  (1.626 m)  Wt 171 lb (77.565 kg)  BMI 29.35 kg/m2  SpO2 100%  LMP 05/06/2011       fhts category 1, baseline 145 bpm but is having occasional variables with some contractions      abd soft between uc      Contractions 1: 2 mins, Pitocin remains at 18 milliunits/ min       SVE: deferred as had been check at 18.35 hrs and has AROM.      Patient is coping well.     Slight nausea after drinking Ginger Ale A   Progressing well in labor. Last SVE at 10.35 hrs 5 -6 cms. IUPC in situ and contraction pattern adequate. P Continue care and working towards NSVD viable female Infant.  Earl Gala, CNM.

## 2012-01-20 LAB — CBC
MCV: 88.1 fL (ref 78.0–100.0)
Platelets: 217 10*3/uL (ref 150–400)
RDW: 13.9 % (ref 11.5–15.5)
WBC: 18.3 10*3/uL — ABNORMAL HIGH (ref 4.0–10.5)

## 2012-01-20 MED ORDER — BENZOCAINE-MENTHOL 20-0.5 % EX AERO
1.0000 "application " | INHALATION_SPRAY | CUTANEOUS | Status: DC | PRN
  Administered 2012-01-20: 1 via TOPICAL
  Filled 2012-01-20: qty 56

## 2012-01-20 MED ORDER — SENNOSIDES-DOCUSATE SODIUM 8.6-50 MG PO TABS: 2.0000 | ORAL_TABLET | Freq: Every day | ORAL | Status: DC

## 2012-01-20 MED ORDER — SIMETHICONE 80 MG PO CHEW: 80.0000 mg | CHEWABLE_TABLET | ORAL | Status: DC | PRN

## 2012-01-20 MED ORDER — WITCH HAZEL-GLYCERIN EX PADS: 1.0000 "application " | MEDICATED_PAD | CUTANEOUS | Status: DC | PRN

## 2012-01-20 MED ORDER — PRENATAL MULTIVITAMIN CH
1.0000 | ORAL_TABLET | Freq: Every day | ORAL | Status: DC
  Administered 2012-01-20 – 2012-01-21 (×2): 1 via ORAL
  Filled 2012-01-20 (×2): qty 1

## 2012-01-20 MED ORDER — PRENATAL 27-0.8 MG PO TABS: 1.0000 | ORAL_TABLET | Freq: Every day | ORAL | Status: DC

## 2012-01-20 MED ORDER — LANOLIN HYDROUS EX OINT: TOPICAL_OINTMENT | CUTANEOUS | Status: DC | PRN

## 2012-01-20 MED ORDER — ONDANSETRON HCL 4 MG/2ML IJ SOLN: 4.0000 mg | INTRAMUSCULAR | Status: DC | PRN

## 2012-01-20 MED ORDER — LIDOCAINE HCL (PF) 1 % IJ SOLN
INTRAMUSCULAR | Status: DC | PRN
  Administered 2012-01-19 (×2): 4 mL

## 2012-01-20 MED ORDER — DIBUCAINE 1 % RE OINT: 1.0000 "application " | TOPICAL_OINTMENT | RECTAL | Status: DC | PRN

## 2012-01-20 MED ORDER — ONDANSETRON HCL 4 MG PO TABS: 4.0000 mg | ORAL_TABLET | ORAL | Status: DC | PRN

## 2012-01-20 MED ORDER — FERROUS SULFATE 325 (65 FE) MG PO TABS
325.0000 mg | ORAL_TABLET | Freq: Two times a day (BID) | ORAL | Status: DC
  Administered 2012-01-20 – 2012-01-21 (×4): 325 mg via ORAL
  Filled 2012-01-20 (×4): qty 1

## 2012-01-20 MED ORDER — ZOLPIDEM TARTRATE 5 MG PO TABS: 5.0000 mg | ORAL_TABLET | Freq: Every evening | ORAL | Status: DC | PRN

## 2012-01-20 MED ORDER — DIPHENHYDRAMINE HCL 25 MG PO CAPS: 25.0000 mg | ORAL_CAPSULE | Freq: Four times a day (QID) | ORAL | Status: DC | PRN

## 2012-01-20 MED ORDER — IBUPROFEN 600 MG PO TABS
600.0000 mg | ORAL_TABLET | Freq: Four times a day (QID) | ORAL | Status: DC
  Administered 2012-01-20 – 2012-01-21 (×8): 600 mg via ORAL
  Filled 2012-01-20 (×8): qty 1

## 2012-01-20 MED ORDER — TETANUS-DIPHTH-ACELL PERTUSSIS 5-2.5-18.5 LF-MCG/0.5 IM SUSP
0.5000 mL | Freq: Once | INTRAMUSCULAR | Status: AC
  Administered 2012-01-21: 0.5 mL via INTRAMUSCULAR

## 2012-01-20 MED ORDER — FENTANYL 2.5 MCG/ML BUPIVACAINE 1/10 % EPIDURAL INFUSION (WH - ANES)
INTRAMUSCULAR | Status: DC | PRN
  Administered 2012-01-19: 14 mL/h via EPIDURAL

## 2012-01-20 MED ORDER — OXYCODONE-ACETAMINOPHEN 5-325 MG PO TABS
1.0000 | ORAL_TABLET | ORAL | Status: DC | PRN
  Administered 2012-01-20 – 2012-01-21 (×3): 1 via ORAL
  Filled 2012-01-20 (×3): qty 1

## 2012-01-20 NOTE — Progress Notes (Signed)
UR chart review completed.  

## 2012-01-20 NOTE — Anesthesia Postprocedure Evaluation (Signed)
  Anesthesia Post-op Note  Patient: Theresa Lawson  Procedure(s) Performed: * No procedures listed *  Patient Location: Mother/Baby  Anesthesia Type: Epidural  Level of Consciousness: awake, alert  and oriented  Airway and Oxygen Therapy: Patient Spontanous Breathing  Post-op Pain: none  Post-op Assessment: Post-op Vital signs reviewed and Patient's Cardiovascular Status Stable  Post-op Vital Signs: Reviewed and stable  Complications: No apparent anesthesia complications

## 2012-01-20 NOTE — Progress Notes (Addendum)
S: comfortable, little bleeding, slept     Bottle feeding O BP 121/82  Pulse 61  Temp 98.4 F (36.9 C) (Oral)  Resp 20  Ht 5\' 4"  (1.626 m)  Wt 171 lb (77.565 kg)  BMI 29.35 kg/m2  SpO2 100%  LMP 05/06/2011  Breastfeeding? Unknown     abd soft, nt, ff      sm  flowperineum clean intact     -Homans sign bilaterally,       No edema    Component Value Date/Time   HGB 9.2* 01/20/2012 0550   HCT 27.3* 01/20/2012 0550   A normal involution     nonlactating     PP day 1     anemia P considering nexplanon, continue care Lavera Guise, CNM

## 2012-01-21 MED ORDER — IBUPROFEN 600 MG PO TABS: 600.0000 mg | ORAL_TABLET | Freq: Four times a day (QID) | ORAL | Status: DC

## 2012-01-21 MED ORDER — OXYCODONE-ACETAMINOPHEN 5-325 MG PO TABS: 1.0000 | ORAL_TABLET | Freq: Four times a day (QID) | ORAL | Status: DC | PRN

## 2012-01-21 MED ORDER — FERROUS SULFATE 325 (65 FE) MG PO TABS: 325.0000 mg | ORAL_TABLET | Freq: Two times a day (BID) | ORAL | Status: DC

## 2012-01-21 NOTE — Progress Notes (Signed)
Post Partum Day 2 Subjective: Reports feeling well.  Ambulating, voiding and tol po liquids and solids without difficulty.  Pos flatus, neg BM.  Reports some uterine cramping and low back pain which is relieved with motrin and Percocet.  Working on breastfeeding.  Baby currently on phototherapy due to elevated bilirubin and not being discharged to home today.Denies weakness or dizziness.  Objective: Blood pressure 116/66, pulse 77, temperature 98 F (36.7 C), temperature source Oral, resp. rate 18, height 5\' 4"  (1.626 m), weight 171 lb (77.565 kg), last menstrual period 05/06/2011, SpO2 99.00%, unknown if currently breastfeeding. Filed Vitals:   01/20/12 0435 01/20/12 1405 01/20/12 2150 01/21/12 0645  BP: 121/82 112/59 112/71 116/66  Pulse: 61 67 62 77  Temp: 98.4 F (36.9 C) 97.8 F (36.6 C) 97.9 F (36.6 C) 98 F (36.7 C)  TempSrc: Oral Oral Oral Oral  Resp: 20 18 18 18   Height:      Weight:      SpO2:   99%    Physical Exam:  General: alert, cooperative and no distress Heart:  RRR Lungs:  CTA bilat Abd:  Soft, NT, pos BS x 4 quads Lochia: appropriate, sm rubra Uterine Fundus: firm, NT, 1 below umb. Incision: Perineum intact DVT Evaluation: No evidence of DVT seen on physical exam. Negative Homan's sign. No significant calf/ankle edema.   Basename 01/20/12 0550 01/19/12 0715  HGB 9.2* 10.1*  HCT 27.3* 30.3*    Assessment/Plan: IUP at 40w 4d delivered. Asymptomatic postpartum anemia  Discharge to home. Discharge instructions reviewed with patient. Return to office 4-6wks for PP exam. Pt desires Implanon for contraception.   LOS: 2 days   Theresa Lawson O. 01/21/2012, 10:56 AM

## 2012-01-21 NOTE — Discharge Summary (Signed)
Obstetric Discharge Summary Reason for Admission: induction of labor Prenatal Procedures: ultrasound Intrapartum Procedures: spontaneous vaginal delivery Postpartum Procedures: none Complications-Operative and Postpartum: none Hemoglobin  Date Value Range Status  01/20/2012 9.2* 12.0 - 15.0 g/dL Final     HCT  Date Value Range Status  01/20/2012 27.3* 36.0 - 46.0 % Final    Physical Exam:  General: alert, cooperative and no distress Heart:  RRR Lungs:  CTA bilat Abd:  Soft, NT with pos BS x 4 quads Lochia: appropriate, sm rubra Uterine Fundus: firm, NT 1 below umb Incision: Perineum intact DVT Evaluation: No evidence of DVT seen on physical exam. Negative Homan's sign. No significant calf/ankle edema.  Discharge Diagnoses: Term Pregnancy-delivered  Discharge Information: Date: 01/21/2012 Activity: unrestricted Diet: routine Medications: Ibuprofen, Iron and Percocet Condition: stable Instructions: refer to practice specific booklet Discharge to: home Follow-up Information    Follow up with [facility]. In 6 weeks.   Contact information:   [address], [address] [place] [address]        Plans Implanon for contraception.  Newborn Data: Live born female  Birth Weight: 7 lb 6.9 oz (3371 g) APGAR: 8, 9  Baby undergoing phototherapy and not discharged today.Marland Kitchen  Zacharias Ridling O. 01/21/2012, 11:10 AM

## 2012-01-21 NOTE — Clinical Social Work Note (Signed)
CSW received call from RN that best practices indicated CSW needed to consult with MOB.  CSW reviewed chart, no consult in system or indicators in H&P, CSW spoke with RN and CNM and neither indicated any concerns for SW to consult.  Please reconsult CSW if any needs arise.  [phone]

## 2012-02-28 ENCOUNTER — Ambulatory Visit (INDEPENDENT_AMBULATORY_CARE_PROVIDER_SITE_OTHER): Payer: Medicaid Other | Admitting: Obstetrics and Gynecology

## 2012-02-28 ENCOUNTER — Encounter: Payer: Self-pay | Admitting: Obstetrics and Gynecology

## 2012-02-28 NOTE — Progress Notes (Signed)
Patient ID: Theresa Lawson, female   DOB: 10/03/1990, 21 y.o.   MRN: [id number] Subjective:     Theresa Lawson is a 21 y.o. female who presents for a postpartum visit.  I have fully reviewed the prenatal and intrapartum course.    Patient is sexually active. Had unprotected IC about 1wk ago.   The following portions of the patient's history were reviewed and updated as appropriate: allergies, current medications, past family history, past medical history, past social history, past surgical history and problem list.  Review of Systems Pertinent items are noted in HPI.   Objective:    BP 100/72  Resp 18  Wt 168 lb (76.204 kg)  Breastfeeding? No  General:  alert, cooperative and no distress     Lungs: clear to auscultation bilaterally  Heart:  regular rate and rhythm, S1, S2 normal, no murmur  Abdomen: soft, non-tender; bowel sounds normal; no masses,  no organomegaly   Vulva:  normal  Vagina: normal vagina  Cervix:  normal  Uterus : normal size, contour, position, consistency, mobility, non-tender  Adnexa:  normal adnexa             Assessment:     Normal postpartum exam.  Pap smear not done at today's visit.  N/A   Plan:  Pt to return next week for nexplanon, instructed to not have intercourse.   S.Aily Tzeng, CNM  02/28/2012 12:15 PM

## 2012-02-28 NOTE — Progress Notes (Signed)
Theresa Lawson  is 6 weeks postpartum following a spontaneous vaginal delivery at 40w 4d gestational weeks Date: 01/19/12 female baby named Shelia Media delivered by Earl Gala, CNM.  Breastfeeding: no Bottlefeeding:  yes  Post-partum blues / depression:  yes  EPDS score: 1   History of abnormal Pap: no  Last Pap: Date  N/A Gestational diabetes:  no  Contraception:  Desires Adult nurse given   Normal urinary function:  yes Normal GI function:  yes Returning to work:  Yes

## 2012-04-30 ENCOUNTER — Ambulatory Visit: Payer: Medicaid Other | Admitting: Obstetrics and Gynecology

## 2012-04-30 ENCOUNTER — Encounter: Payer: Self-pay | Admitting: Obstetrics and Gynecology

## 2012-04-30 VITALS — BP 100/62 | HR 68 | Wt 168.0 lb

## 2012-04-30 DIAGNOSIS — IMO0001 Reserved for inherently not codable concepts without codable children: Secondary | ICD-10-CM

## 2012-04-30 DIAGNOSIS — Z30017 Encounter for initial prescription of implantable subdermal contraceptive: Secondary | ICD-10-CM

## 2012-04-30 MED ORDER — ETONOGESTREL 68 MG ~~LOC~~ IMPL
68.0000 mg | DRUG_IMPLANT | Freq: Once | SUBCUTANEOUS | Status: AC
  Administered 2012-04-30: 68 mg via SUBCUTANEOUS

## 2012-04-30 NOTE — Progress Notes (Signed)
22 YO for Nexplanon insertion.  Discussed this method during her pregnancy and researched it on her own. Overview of method given to include side effects, risks and benefits along with MOA. Has no questions.   O: Nexplanon inserted per protocol in medial left upper arm without difficulty.  UPT-negative  A: Nexplanon Insertion  P: Call [facility] [phone]:  -for temperature of 100.4 degrees Fahrenheit or more -pain not improved with over the counter pain medications (Ibuprofen, Advil, Aleve,     Tylenol or acetaminophen) -for excessive bleeding from insertion site -for excessive swelling redness or green drainage from your insertion site -for any other concerns -keep insertion site clean, dry and covered  for 24 hours -you may remove pressure bandage in 1-4 hours  Use a back-up method of birth control for the next 4 weeks  RTO-1 week for follow up  Michaiah Maiden, PA-C

## 2012-04-30 NOTE — Patient Instructions (Addendum)
Call [facility] [phone]:  -for temperature of 100.4 degrees Fahrenheit or more -pain not improved with over the counter pain medications (Ibuprofen, Advil, Aleve,     Tylenol or acetaminophen) -for excessive bleeding from insertion site -for excessive swelling redness or green drainage from your insertion site -for any other concerns -keep insertion site clean, dry and covered  for 24 hours -you may remove pressure bandage in 1-4 hours  Use a back-up method of birth control for the next 4 weeks  

## 2014-02-03 ENCOUNTER — Encounter: Payer: Self-pay | Admitting: Obstetrics and Gynecology

## 2014-04-22 ENCOUNTER — Encounter (HOSPITAL_COMMUNITY): Payer: Self-pay | Admitting: Neurology

## 2014-04-22 ENCOUNTER — Emergency Department (HOSPITAL_COMMUNITY)
Admission: EM | Admit: 2014-04-22 | Discharge: 2014-04-22 | Disposition: A | Discharge status: Home / Self Care | Payer: Medicaid Other | Attending: Emergency Medicine | Admitting: Emergency Medicine

## 2014-04-22 DIAGNOSIS — Z8742 Personal history of other diseases of the female genital tract: Secondary | ICD-10-CM | POA: Insufficient documentation

## 2014-04-22 DIAGNOSIS — J45909 Unspecified asthma, uncomplicated: Secondary | ICD-10-CM | POA: Insufficient documentation

## 2014-04-22 DIAGNOSIS — Z87891 Personal history of nicotine dependence: Secondary | ICD-10-CM | POA: Diagnosis not present

## 2014-04-22 DIAGNOSIS — B349 Viral infection, unspecified: Secondary | ICD-10-CM

## 2014-04-22 DIAGNOSIS — H9201 Otalgia, right ear: Secondary | ICD-10-CM | POA: Insufficient documentation

## 2014-04-22 DIAGNOSIS — Z8619 Personal history of other infectious and parasitic diseases: Secondary | ICD-10-CM | POA: Insufficient documentation

## 2014-04-22 MED ORDER — IBUPROFEN 800 MG PO TABS: 800.0000 mg | ORAL_TABLET | Freq: Three times a day (TID) | ORAL | Status: DC

## 2014-04-22 MED ORDER — DIPHENHYDRAMINE HCL 25 MG PO TABS: 25.0000 mg | ORAL_TABLET | Freq: Four times a day (QID) | ORAL | Status: DC | PRN

## 2014-04-22 MED ORDER — IBUPROFEN 400 MG PO TABS
400.0000 mg | ORAL_TABLET | Freq: Once | ORAL | Status: AC
  Administered 2014-04-22: 400 mg via ORAL
  Filled 2014-04-22: qty 1

## 2014-04-22 NOTE — Discharge Instructions (Signed)
Take Ibuprofen as needed for pain. Take benadryl as needed for congestion. Continue to take your over the counter cold medicine for symptoms. Refer to attached documents for more information.

## 2014-04-22 NOTE — ED Notes (Signed)
Pt reports right ear pain for 2 days and cold symptoms, cough.

## 2014-04-22 NOTE — ED Provider Notes (Signed)
CSN: [id number]     Arrival date & time 04/22/14  0935 History  This chart was scribed for non-physician practitioner, Emilia Beck, PA-C, working with Toy Baker, MD by Charline Bills, ED Scribe. This patient was seen in room TR09C/TR09C and the patient's care was started at 10:49 AM.   Chief Complaint  Patient presents with  . Otalgia  . URI   The history is provided by the patient. No language interpreter was used.   HPI Comments: Theresa Lawson is a 24 y.o. female, with a h/o asthma, who presents to the Emergency Department complaining of constant, gradually worsening R ear pain onset 2 days ago. She describes pain as pressure. Pt reports associated cough, sore throat, HA. She denies fever. Pt has been treating with OTC cough and cold medicine without relief.   Past Medical History  Diagnosis Date  . Seasonal allergies   . Asthma     childhood  . Pyelonephritis     DURING AND BEFORE PREG;WAS HOSPITALIZED PREVIOUSLY  . Infection     YEAST INF;NOT FREQ  . Headache(784.0)     SINCE CURRENT PREGNANCY  . Polyhydramnios 2010    WAS GETTING REG NST'S DURING PREG  . Pregnancy induced hypertension 2010    NO MEDS;HAD INCREASED STESS  . Irregular periods/menstrual cycles 12/18/2011  . Unsure LMP 12/18/2011    EDC by [redacted]w[redacted]d u/s at [facility]   Past Surgical History  Procedure Laterality Date  . Vaginal delivery     Family History  Problem Relation Age of Onset  . Asthma Sister   . Asthma Son   . Asthma Brother     X 2  . Nephrolithiasis Mother     HAS HAD 2-3 SURGERIES TO REMOVE   History  Substance Use Topics  . Smoking status: Former Smoker    Types: Cigarettes    Quit date: 05/20/2011  . Smokeless tobacco: Never Used  . Alcohol Use: No   OB History    Gravida Para Term Preterm AB TAB SAB Ectopic Multiple Living   0 0 0 0 0 0 2     Review of Systems  Constitutional: Negative for fever.  HENT: Positive for ear pain and sore throat.   Respiratory: Positive for  cough.   Neurological: Positive for headaches.  All other systems reviewed and are negative.  Allergies  Review of patient's allergies indicates no known allergies.  Home Medications   Prior to Admission medications   Not on File   Triage Vitals: BP 146/80 mmHg  Pulse 90  Temp(Src) 98.4 F (36.9 C) (Oral)  Resp 20  SpO2 100%  LMP 04/21/2014 Physical Exam  Constitutional: She is oriented to person, place, and time. She appears well-developed and well-nourished. No distress.  HENT:  Head: Normocephalic and atraumatic.  Mouth/Throat: Oropharynx is clear and moist.  Copious cerumen in bilateral external ear canals. Mild tenderness with manipulation of R oracle.   Eyes: Conjunctivae and EOM are normal.  Neck: Neck supple.  Cardiovascular: Normal rate and regular rhythm.   Pulmonary/Chest: Effort normal and breath sounds normal.  Abdominal: Soft. She exhibits no distension. There is no tenderness.  Musculoskeletal: Normal range of motion.  Neurological: She is alert and oriented to person, place, and time.  Skin: Skin is warm and dry.  Psychiatric: She has a normal mood and affect. Her behavior is normal.  Nursing note and vitals reviewed.  ED Course  Procedures (including critical care time) DIAGNOSTIC STUDIES: Oxygen Saturation  is 100% on RA, normal by my interpretation.    COORDINATION OF CARE: 10:52 AM-Discussed treatment plan which includes ibuprofen and Benadryl with pt at bedside and pt agreed to plan.   Labs Review Labs Reviewed - No data to display  Imaging Review No results found.   EKG Interpretation None      MDM   Final diagnoses:  Right ear pain  Viral illness   Patient's pain likely from congestion. Patient will have benadryl and ibuprofen for symptoms.   I personally performed the services described in this documentation, which was scribed in my presence. The recorded information has been reviewed and is accurate.    Emilia BeckKaitlyn Jearld Hemp,  PA-C 04/24/14 2046  Toy BakerAnthony T Allen, MD 04/27/14 [phone]

## 2014-04-22 NOTE — ED Notes (Signed)
PT REQUESTING IBUPROFEN.

## 2014-09-20 ENCOUNTER — Encounter (HOSPITAL_COMMUNITY): Payer: Self-pay | Admitting: Nurse Practitioner

## 2014-09-20 ENCOUNTER — Emergency Department (HOSPITAL_COMMUNITY)
Admission: EM | Admit: 2014-09-20 | Discharge: 2014-09-20 | Disposition: A | Discharge status: Home / Self Care | Payer: Medicaid Other | Attending: Emergency Medicine | Admitting: Emergency Medicine

## 2014-09-20 ENCOUNTER — Emergency Department (HOSPITAL_COMMUNITY): Payer: Medicaid Other

## 2014-09-20 DIAGNOSIS — R1030 Lower abdominal pain, unspecified: Secondary | ICD-10-CM | POA: Diagnosis not present

## 2014-09-20 DIAGNOSIS — J45909 Unspecified asthma, uncomplicated: Secondary | ICD-10-CM | POA: Insufficient documentation

## 2014-09-20 DIAGNOSIS — Z3A01 Less than 8 weeks gestation of pregnancy: Secondary | ICD-10-CM | POA: Diagnosis not present

## 2014-09-20 DIAGNOSIS — R34 Anuria and oliguria: Secondary | ICD-10-CM | POA: Diagnosis not present

## 2014-09-20 DIAGNOSIS — Z8619 Personal history of other infectious and parasitic diseases: Secondary | ICD-10-CM | POA: Diagnosis not present

## 2014-09-20 DIAGNOSIS — O210 Mild hyperemesis gravidarum: Secondary | ICD-10-CM | POA: Insufficient documentation

## 2014-09-20 DIAGNOSIS — Z3491 Encounter for supervision of normal pregnancy, unspecified, first trimester: Secondary | ICD-10-CM

## 2014-09-20 DIAGNOSIS — Z87448 Personal history of other diseases of urinary system: Secondary | ICD-10-CM | POA: Insufficient documentation

## 2014-09-20 DIAGNOSIS — O99511 Diseases of the respiratory system complicating pregnancy, first trimester: Secondary | ICD-10-CM | POA: Diagnosis not present

## 2014-09-20 DIAGNOSIS — Z791 Long term (current) use of non-steroidal anti-inflammatories (NSAID): Secondary | ICD-10-CM | POA: Insufficient documentation

## 2014-09-20 DIAGNOSIS — Z87891 Personal history of nicotine dependence: Secondary | ICD-10-CM | POA: Insufficient documentation

## 2014-09-20 DIAGNOSIS — O9989 Other specified diseases and conditions complicating pregnancy, childbirth and the puerperium: Secondary | ICD-10-CM | POA: Diagnosis present

## 2014-09-20 DIAGNOSIS — R109 Unspecified abdominal pain: Secondary | ICD-10-CM

## 2014-09-20 LAB — CBC WITH DIFFERENTIAL/PLATELET
Basophils Absolute: 0 10*3/uL (ref 0.0–0.1)
Basophils Relative: 0 % (ref 0–1)
EOS ABS: 0.2 10*3/uL (ref 0.0–0.7)
Eosinophils Relative: 2 % (ref 0–5)
HCT: 38.5 % (ref 36.0–46.0)
HEMOGLOBIN: 13.3 g/dL (ref 12.0–15.0)
LYMPHS ABS: 1.8 10*3/uL (ref 0.7–4.0)
Lymphocytes Relative: 17 % (ref 12–46)
MCH: 28.5 pg (ref 26.0–34.0)
MCHC: 34.5 g/dL (ref 30.0–36.0)
MCV: 82.6 fL (ref 78.0–100.0)
Monocytes Absolute: 0.5 10*3/uL (ref 0.1–1.0)
Monocytes Relative: 5 % (ref 3–12)
NEUTROS ABS: 8 10*3/uL — AB (ref 1.7–7.7)
NEUTROS PCT: 76 % (ref 43–77)
PLATELETS: 220 10*3/uL (ref 150–400)
RBC: 4.66 MIL/uL (ref 3.87–5.11)
RDW: 12.3 % (ref 11.5–15.5)
WBC: 10.6 10*3/uL — AB (ref 4.0–10.5)

## 2014-09-20 LAB — COMPREHENSIVE METABOLIC PANEL
ALT: 14 U/L (ref 14–54)
ANION GAP: 8 (ref 5–15)
AST: 18 U/L (ref 15–41)
Albumin: 4.2 g/dL (ref 3.5–5.0)
Alkaline Phosphatase: 71 U/L (ref 38–126)
BUN: 9 mg/dL (ref 6–20)
CALCIUM: 9.3 mg/dL (ref 8.9–10.3)
CO2: 23 mmol/L (ref 22–32)
Chloride: 104 mmol/L (ref 101–111)
Creatinine, Ser: 0.66 mg/dL (ref 0.44–1.00)
GLUCOSE: 90 mg/dL (ref 65–99)
Potassium: 4.4 mmol/L (ref 3.5–5.1)
SODIUM: 135 mmol/L (ref 135–145)
TOTAL PROTEIN: 7.2 g/dL (ref 6.5–8.1)
Total Bilirubin: 0.9 mg/dL (ref 0.3–1.2)

## 2014-09-20 LAB — URINALYSIS, ROUTINE W REFLEX MICROSCOPIC
Bilirubin Urine: NEGATIVE
Glucose, UA: NEGATIVE mg/dL
HGB URINE DIPSTICK: NEGATIVE
Leukocytes, UA: NEGATIVE
NITRITE: NEGATIVE
Protein, ur: NEGATIVE mg/dL
SPECIFIC GRAVITY, URINE: 1.031 — AB (ref 1.005–1.030)
UROBILINOGEN UA: 1 mg/dL (ref 0.0–1.0)
pH: 6 (ref 5.0–8.0)

## 2014-09-20 LAB — HCG, QUANTITATIVE, PREGNANCY: hCG, Beta Chain, Quant, S: 32772 m[IU]/mL — ABNORMAL HIGH (ref <5)

## 2014-09-20 LAB — POC URINE PREG, ED: Preg Test, Ur: POSITIVE — AB

## 2014-09-20 LAB — OB RESULTS CONSOLE GC/CHLAMYDIA: GC PROBE AMP, GENITAL: NEGATIVE

## 2014-09-20 MED ORDER — SODIUM CHLORIDE 0.9 % IV BOLUS (SEPSIS)
1000.0000 mL | Freq: Once | INTRAVENOUS | Status: AC
  Administered 2014-09-20: 1000 mL via INTRAVENOUS

## 2014-09-20 MED ORDER — ONDANSETRON HCL 4 MG/2ML IJ SOLN
4.0000 mg | Freq: Once | INTRAMUSCULAR | Status: AC
  Administered 2014-09-20: 4 mg via INTRAVENOUS
  Filled 2014-09-20: qty 2

## 2014-09-20 MED ORDER — ONDANSETRON 4 MG PO TBDP: 4.0000 mg | ORAL_TABLET | Freq: Three times a day (TID) | ORAL | Status: DC | PRN

## 2014-09-20 NOTE — ED Notes (Signed)
C/o nausea 

## 2014-09-20 NOTE — ED Notes (Addendum)
She had positive home preg test last week and she is worried she may be dehydrated because shes had bad morning sickness and can not keep anything down. She c/o body aches, n/v, headaches and dizziness. She has been unable to get OB appt for pregnancy

## 2014-09-20 NOTE — ED Notes (Signed)
Pt in [place]

## 2014-09-20 NOTE — ED Provider Notes (Signed)
CSN: [id number]     Arrival date & time 09/20/14  1620 History   First MD Initiated Contact with Patient 09/20/14 1648     Chief Complaint  Patient presents with  . Nausea     (Consider location/radiation/quality/duration/timing/severity/associated sxs/prior Treatment) Patient is a 24 y.o. female presenting with vomiting. The history is provided by the patient.  Emesis Severity:  Moderate Duration:  3 days Number of daily episodes:  5 Quality:  Stomach contents How soon after eating does vomiting occur:  5 minutes Progression:  Unchanged Chronicity:  New Recent urination:  Decreased Relieved by:  Nothing Worsened by:  Nothing tried Ineffective treatments:  None tried Associated symptoms: no abdominal pain and no fever   Risk factors: pregnant now ( 5/10 LMP)     Past Medical History  Diagnosis Date  . Seasonal allergies   . Asthma     childhood  . Pyelonephritis     DURING AND BEFORE PREG;WAS HOSPITALIZED PREVIOUSLY  . Infection     YEAST INF;NOT FREQ  . Headache(784.0)     SINCE CURRENT PREGNANCY  . Polyhydramnios 2010    WAS GETTING REG NST'S DURING PREG  . Pregnancy induced hypertension 2010    NO MEDS;HAD INCREASED STESS  . Irregular periods/menstrual cycles 12/18/2011  . Unsure LMP 12/18/2011    EDC by [redacted]w[redacted]d u/s at [facility]   Past Surgical History  Procedure Laterality Date  . Vaginal delivery     Family History  Problem Relation Age of Onset  . Asthma Sister   . Asthma Son   . Asthma Brother     X 2  . Nephrolithiasis Mother     HAS HAD 2-3 SURGERIES TO REMOVE   History  Substance Use Topics  . Smoking status: Former Smoker    Types: Cigarettes    Quit date: 05/20/2011  . Smokeless tobacco: Never Used  . Alcohol Use: No   OB History    Gravida Para Term Preterm AB TAB SAB Ectopic Multiple Living   0 0 0 0 0 0 2     Review of Systems  Gastrointestinal: Positive for vomiting. Negative for abdominal pain.  All other systems reviewed and are  negative.     Allergies  Review of patient's allergies indicates no known allergies.  Home Medications   Prior to Admission medications   Medication Sig Start Date End Date Taking? Authorizing Provider  diphenhydrAMINE (BENADRYL) 25 MG tablet Take 1 tablet (25 mg total) by mouth every 6 (six) hours as needed for itching or allergies. 04/22/14   Kaitlyn Szekalski, PA-C  ibuprofen (ADVIL,MOTRIN) 800 MG tablet Take 1 tablet (800 mg total) by mouth 3 (three) times daily. 04/22/14   Kaitlyn Szekalski, PA-C  ondansetron (ZOFRAN ODT) 4 MG disintegrating tablet Take 1 tablet (4 mg total) by mouth every 8 (eight) hours as needed for nausea or vomiting. 09/20/14   Lyndal Pulley, MD   BP 103/50 mmHg  Pulse 71  Temp(Src) 98.5 F (36.9 C) (Oral)  Resp 16  Ht  (1.6 m)  Wt 159 lb (72.122 kg)  BMI 28.17 kg/m2  SpO2 100%  LMP 08/12/2014 Physical Exam  Constitutional: She is oriented to person, place, and time. She appears well-developed and well-nourished. No distress.  HENT:  Head: Normocephalic.  Eyes: Conjunctivae are normal.  Neck: Neck supple. No tracheal deviation present.  Cardiovascular: Normal rate and regular rhythm.   Pulmonary/Chest: Effort normal. No respiratory distress.  Abdominal: Soft. She exhibits no distension.  Genitourinary: Uterus normal. Cervix exhibits no motion tenderness. Right adnexum displays no tenderness. Left adnexum displays no tenderness. No bleeding in the vagina.  Neurological: She is alert and oriented to person, place, and time.  Skin: Skin is warm and dry.  Psychiatric: She has a normal mood and affect.    ED Course  Procedures (including critical care time) Labs Review Labs Reviewed  CBC WITH DIFFERENTIAL/PLATELET - Abnormal; Notable for the following:    WBC 10.6 (*)    Neutro Abs 8.0 (*)    All other components within normal limits  URINALYSIS, ROUTINE W REFLEX MICROSCOPIC (NOT AT [facility]) - Abnormal; Notable for the following:    Specific  Gravity, Urine 1.031 (*)    Ketones, ur >80 (*)    All other components within normal limits  HCG, QUANTITATIVE, PREGNANCY - Abnormal; Notable for the following:    hCG, Beta Chain, Quant, S [address]109 (*)    All other components within normal limits  POC URINE PREG, ED - Abnormal; Notable for the following:    Preg Test, Ur POSITIVE (*)    All other components within normal limits  COMPREHENSIVE METABOLIC PANEL  RPR  GC/CHLAMYDIA PROBE AMP ([place]) NOT AT [facility]    Imaging Review US Ob Comp Less 14 Wks  09/20/2014   CLINICAL DATA:  Abdominal and pelvic pain  EXAM: OBSTETRIC <14 WK [place] AND TRANSVAGINAL OB US  TECHNIQUE: Both transabdominal and transvaginal ultrasound examinations were performed for complete evaluation of the gestation as well as the maternal uterus, adnexal regions, and pelvic cul-de-sac. Transvaginal technique was performed to assess early pregnancy.  COMPARISON:  None.  FINDINGS: Intrauterine gestational sac: Visualized/normal in shape.  Yolk sac:  Visualized  Embryo:  Visualized  Cardiac Activity: Visualized  Heart Rate: 117  bpm  CRL:  3  mm   5 w   6 d                  [place] EDC: May 17, 2015  Maternal uterus/adnexae: There is no demonstrable subchorionic hemorrhage. The cervical os is closed. There are no extrauterine pelvic or adnexal masses. There is trace free pelvic fluid. Note that there are several prominent periadnexal vascular structures bilaterally.  IMPRESSION: Single live intrauterine gestation with estimated gestational age of [redacted] weeks. No extrauterine pelvic masses. Prominent pelvic vascular structures are of uncertain significance. Early pelvic congestion syndrome could present in this manner.   Electronically Signed   By: Bretta Bang III M.D.   On: 09/20/2014 19:20   US Ob Transvaginal  09/20/2014   CLINICAL DATA:  Abdominal and pelvic pain  EXAM: OBSTETRIC <14 WK [place] AND TRANSVAGINAL OB US  TECHNIQUE: Both transabdominal and transvaginal ultrasound  examinations were performed for complete evaluation of the gestation as well as the maternal uterus, adnexal regions, and pelvic cul-de-sac. Transvaginal technique was performed to assess early pregnancy.  COMPARISON:  None.  FINDINGS: Intrauterine gestational sac: Visualized/normal in shape.  Yolk sac:  Visualized  Embryo:  Visualized  Cardiac Activity: Visualized  Heart Rate: 117  bpm  CRL:  3  mm   5 w   6 d                  [place] EDC: May 17, 2015  Maternal uterus/adnexae: There is no demonstrable subchorionic hemorrhage. The cervical os is closed. There are no extrauterine pelvic or adnexal masses. There is trace free pelvic fluid. Note that there are several prominent periadnexal vascular structures bilaterally.  IMPRESSION: Single live intrauterine gestation with estimated gestational age of [redacted] weeks. No extrauterine pelvic masses. Prominent pelvic vascular structures are of uncertain significance. Early pelvic congestion syndrome could present in this manner.   Electronically Signed   By: Bretta Bang III M.D.   On: 09/20/2014 19:20     EKG Interpretation None      MDM   Final diagnoses:  Normal IUP (intrauterine pregnancy) on prenatal ultrasound, first trimester   24 year old female presents with recent pregnancy and ongoing morning sickness. She has had some lower abdominal discomfort as well and does not have a confirmed IUP. Pelvic ultrasound was ordered here that demonstrated that she does indeed have an IUP, she is given some IV fluids as she worried that she was dehydrated, she was never tachycardic during a portion of her visit and otherwise remained well-appearing. Zofran improved her symptoms. She is provided a Zofran ODT prescription for home and recommended routine follow-up with an OB/GYN as well as routine prenatal care.  No evidence of urinary tract infection, no signs of preeclampsia, and patient was ambulatory without difficulty from the emergency  department.    Lyndal Pulley, MD 09/21/14 1655  Pricilla Loveless, MD 09/21/14 2109

## 2014-09-20 NOTE — Discharge Instructions (Signed)
First Trimester of Pregnancy The first trimester of pregnancy is from week 1 until the end of week 12 (months 1 through 3). A week after a sperm fertilizes an egg, the egg will implant on the wall of the uterus. This embryo will begin to develop into a baby. Genes from you and your partner are forming the baby. The female genes determine whether the baby is a boy or a girl. At 6-8 weeks, the eyes and face are formed, and the heartbeat can be seen on ultrasound. At the end of 12 weeks, all the baby's organs are formed.  Now that you are pregnant, you will want to do everything you can to have a healthy baby. Two of the most important things are to get good prenatal care and to follow your health care provider's instructions. Prenatal care is all the medical care you receive before the baby's birth. This care will help prevent, find, and treat any problems during the pregnancy and childbirth. BODY CHANGES Your body goes through many changes during pregnancy. The changes vary from woman to woman.   You may gain or lose a couple of pounds at first.  You may feel sick to your stomach (nauseous) and throw up (vomit). If the vomiting is uncontrollable, call your health care provider.  You may tire easily.  You may develop headaches that can be relieved by medicines approved by your health care provider.  You may urinate more often. Painful urination may mean you have a bladder infection.  You may develop heartburn as a result of your pregnancy.  You may develop constipation because certain hormones are causing the muscles that push waste through your intestines to slow down.  You may develop hemorrhoids or swollen, bulging veins (varicose veins).  Your breasts may begin to grow larger and become tender. Your nipples may stick out more, and the tissue that surrounds them (areola) may become darker.  Your gums may bleed and may be sensitive to brushing and flossing.  Dark spots or blotches (chloasma,  mask of pregnancy) may develop on your face. This will likely fade after the baby is born.  Your menstrual periods will stop.  You may have a loss of appetite.  You may develop cravings for certain kinds of food.  You may have changes in your emotions from day to day, such as being excited to be pregnant or being concerned that something may go wrong with the pregnancy and baby.  You may have more vivid and strange dreams.  You may have changes in your hair. These can include thickening of your hair, rapid growth, and changes in texture. Some women also have hair loss during or after pregnancy, or hair that feels dry or thin. Your hair will most likely return to normal after your baby is born. WHAT TO EXPECT AT YOUR PRENATAL VISITS During a routine prenatal visit:  You will be weighed to make sure you and the baby are growing normally.  Your blood pressure will be taken.  Your abdomen will be measured to track your baby's growth.  The fetal heartbeat will be listened to starting around week 10 or 12 of your pregnancy.  Test results from any previous visits will be discussed. Your health care provider may ask you:  How you are feeling.  If you are feeling the baby move.  If you have had any abnormal symptoms, such as leaking fluid, bleeding, severe headaches, or abdominal cramping.  If you have any questions. Other tests   that may be performed during your first trimester include:  Blood tests to find your blood type and to check for the presence of any previous infections. They will also be used to check for low iron levels (anemia) and Rh antibodies. Later in the pregnancy, blood tests for diabetes will be done along with other tests if problems develop.  Urine tests to check for infections, diabetes, or protein in the urine.  An ultrasound to confirm the proper growth and development of the baby.  An amniocentesis to check for possible genetic problems.  Fetal screens for  spina bifida and Down syndrome.  You may need other tests to make sure you and the baby are doing well. HOME CARE INSTRUCTIONS  Medicines  Follow your health care provider's instructions regarding medicine use. Specific medicines may be either safe or unsafe to take during pregnancy.  Take your prenatal vitamins as directed.  If you develop constipation, try taking a stool softener if your health care provider approves. Diet  Eat regular, well-balanced meals. Choose a variety of foods, such as meat or vegetable-based protein, fish, milk and low-fat dairy products, vegetables, fruits, and whole grain breads and cereals. Your health care provider will help you determine the amount of weight gain that is right for you.  Avoid raw meat and uncooked cheese. These carry germs that can cause birth defects in the baby.  Eating four or five small meals rather than three large meals a day may help relieve nausea and vomiting. If you start to feel nauseous, eating a few soda crackers can be helpful. Drinking liquids between meals instead of during meals also seems to help nausea and vomiting.  If you develop constipation, eat more high-fiber foods, such as fresh vegetables or fruit and whole grains. Drink enough fluids to keep your urine clear or pale yellow. Activity and Exercise  Exercise only as directed by your health care provider. Exercising will help you:  Control your weight.  Stay in shape.  Be prepared for labor and delivery.  Experiencing pain or cramping in the lower abdomen or low back is a good sign that you should stop exercising. Check with your health care provider before continuing normal exercises.  Try to avoid standing for long periods of time. Move your legs often if you must stand in one place for a long time.  Avoid heavy lifting.  Wear low-heeled shoes, and practice good posture.  You may continue to have sex unless your health care provider directs you  otherwise. Relief of Pain or Discomfort  Wear a good support bra for breast tenderness.   Take warm sitz baths to soothe any pain or discomfort caused by hemorrhoids. Use hemorrhoid cream if your health care provider approves.   Rest with your legs elevated if you have leg cramps or low back pain.  If you develop varicose veins in your legs, wear support hose. Elevate your feet for 15 minutes, 3-4 times a day. Limit salt in your diet. Prenatal Care  Schedule your prenatal visits by the twelfth week of pregnancy. They are usually scheduled monthly at first, then more often in the last 2 months before delivery.  Write down your questions. Take them to your prenatal visits.  Keep all your prenatal visits as directed by your health care provider. Safety  Wear your seat belt at all times when driving.  Make a list of emergency phone numbers, including numbers for family, friends, the hospital, and police and fire departments. General Tips    Ask your health care provider for a referral to a local prenatal education class. Begin classes no later than at the beginning of month 6 of your pregnancy.  Ask for help if you have counseling or nutritional needs during pregnancy. Your health care provider can offer advice or refer you to specialists for help with various needs.  Do not use hot tubs, steam rooms, or saunas.  Do not douche or use tampons or scented sanitary pads.  Do not cross your legs for long periods of time.  Avoid cat litter boxes and soil used by cats. These carry germs that can cause birth defects in the baby and possibly loss of the fetus by miscarriage or stillbirth.  Avoid all smoking, herbs, alcohol, and medicines not prescribed by your health care provider. Chemicals in these affect the formation and growth of the baby.  Schedule a dentist appointment. At home, brush your teeth with a soft toothbrush and be gentle when you floss. SEEK MEDICAL CARE IF:   You have  dizziness.  You have mild pelvic cramps, pelvic pressure, or nagging pain in the abdominal area.  You have persistent nausea, vomiting, or diarrhea.  You have a bad smelling vaginal discharge.  You have pain with urination.  You notice increased swelling in your face, hands, legs, or ankles. SEEK IMMEDIATE MEDICAL CARE IF:   You have a fever.  You are leaking fluid from your vagina.  You have spotting or bleeding from your vagina.  You have severe abdominal cramping or pain.  You have rapid weight gain or loss.  You vomit blood or material that looks like coffee grounds.  You are exposed to German measles and have never had them.  You are exposed to fifth disease or chickenpox.  You develop a severe headache.  You have shortness of breath.  You have any kind of trauma, such as from a fall or a car accident. Document Released: 03/15/2001 Document Revised: 08/05/2013 Document Reviewed: 01/29/2013 [employer] Patient Information 2015 [employer]. This information is not intended to replace advice given to you by your health care provider. Make sure you discuss any questions you have with your health care provider.  

## 2014-09-21 LAB — RPR: RPR: NONREACTIVE

## 2014-09-22 LAB — GC/CHLAMYDIA PROBE AMP (~~LOC~~) NOT AT ARMC
Chlamydia: NEGATIVE
Neisseria Gonorrhea: NEGATIVE

## 2014-11-10 ENCOUNTER — Other Ambulatory Visit (HOSPITAL_COMMUNITY): Payer: Self-pay | Admitting: Nurse Practitioner

## 2014-11-10 DIAGNOSIS — Z3A18 18 weeks gestation of pregnancy: Secondary | ICD-10-CM

## 2014-11-10 DIAGNOSIS — Z3689 Encounter for other specified antenatal screening: Secondary | ICD-10-CM

## 2014-11-10 LAB — OB RESULTS CONSOLE HIV ANTIBODY (ROUTINE TESTING): HIV: NONREACTIVE

## 2014-11-10 LAB — OB RESULTS CONSOLE HEPATITIS B SURFACE ANTIGEN: HEP B S AG: NEGATIVE

## 2014-11-10 LAB — OB RESULTS CONSOLE RUBELLA ANTIBODY, IGM: RUBELLA: IMMUNE

## 2014-12-08 ENCOUNTER — Inpatient Hospital Stay (HOSPITAL_COMMUNITY): Payer: Medicaid Other

## 2014-12-08 ENCOUNTER — Inpatient Hospital Stay (HOSPITAL_COMMUNITY)
Admission: AD | Admit: 2014-12-08 | Discharge: 2014-12-08 | Disposition: A | Discharge status: Home / Self Care | Payer: Medicaid Other | Source: Ambulatory Visit | Attending: Obstetrics & Gynecology | Admitting: Obstetrics & Gynecology

## 2014-12-08 ENCOUNTER — Encounter (HOSPITAL_COMMUNITY): Payer: Self-pay | Admitting: *Deleted

## 2014-12-08 DIAGNOSIS — O26892 Other specified pregnancy related conditions, second trimester: Secondary | ICD-10-CM | POA: Diagnosis not present

## 2014-12-08 DIAGNOSIS — O9989 Other specified diseases and conditions complicating pregnancy, childbirth and the puerperium: Secondary | ICD-10-CM

## 2014-12-08 DIAGNOSIS — Z87891 Personal history of nicotine dependence: Secondary | ICD-10-CM | POA: Insufficient documentation

## 2014-12-08 DIAGNOSIS — R109 Unspecified abdominal pain: Secondary | ICD-10-CM | POA: Diagnosis present

## 2014-12-08 DIAGNOSIS — R51 Headache: Secondary | ICD-10-CM | POA: Diagnosis not present

## 2014-12-08 DIAGNOSIS — O26893 Other specified pregnancy related conditions, third trimester: Secondary | ICD-10-CM | POA: Insufficient documentation

## 2014-12-08 DIAGNOSIS — R519 Headache, unspecified: Secondary | ICD-10-CM

## 2014-12-08 DIAGNOSIS — Z3A16 16 weeks gestation of pregnancy: Secondary | ICD-10-CM | POA: Insufficient documentation

## 2014-12-08 DIAGNOSIS — M549 Dorsalgia, unspecified: Secondary | ICD-10-CM | POA: Insufficient documentation

## 2014-12-08 LAB — URINALYSIS, ROUTINE W REFLEX MICROSCOPIC
BILIRUBIN URINE: NEGATIVE
Glucose, UA: NEGATIVE mg/dL
HGB URINE DIPSTICK: NEGATIVE
Ketones, ur: 15 mg/dL — AB
Leukocytes, UA: NEGATIVE
Nitrite: NEGATIVE
PH: 6 (ref 5.0–8.0)
Protein, ur: NEGATIVE mg/dL
SPECIFIC GRAVITY, URINE: 1.015 (ref 1.005–1.030)
Urobilinogen, UA: 1 mg/dL (ref 0.0–1.0)

## 2014-12-08 LAB — CBC WITH DIFFERENTIAL/PLATELET
BASOS ABS: 0 10*3/uL (ref 0.0–0.1)
BASOS PCT: 0 % (ref 0–1)
Eosinophils Absolute: 0.3 10*3/uL (ref 0.0–0.7)
Eosinophils Relative: 3 % (ref 0–5)
HEMATOCRIT: 31.2 % — AB (ref 36.0–46.0)
Hemoglobin: 10.3 g/dL — ABNORMAL LOW (ref 12.0–15.0)
LYMPHS PCT: 17 % (ref 12–46)
Lymphs Abs: 1.7 10*3/uL (ref 0.7–4.0)
MCH: 28.3 pg (ref 26.0–34.0)
MCHC: 33 g/dL (ref 30.0–36.0)
MCV: 85.7 fL (ref 78.0–100.0)
Monocytes Absolute: 0.6 10*3/uL (ref 0.1–1.0)
Monocytes Relative: 6 % (ref 3–12)
NEUTROS ABS: 7.7 10*3/uL (ref 1.7–7.7)
Neutrophils Relative %: 74 % (ref 43–77)
PLATELETS: 180 10*3/uL (ref 150–400)
RBC: 3.64 MIL/uL — AB (ref 3.87–5.11)
RDW: 13.9 % (ref 11.5–15.5)
WBC: 10.4 10*3/uL (ref 4.0–10.5)

## 2014-12-08 MED ORDER — NEOMYCIN-POLYMYXIN-HC 3.5-10000-1 OT SUSP: 4.0000 [drp] | Freq: Three times a day (TID) | OTIC | Status: DC

## 2014-12-08 MED ORDER — METOCLOPRAMIDE HCL 5 MG/ML IJ SOLN
10.0000 mg | Freq: Once | INTRAMUSCULAR | Status: AC
  Administered 2014-12-08: 10 mg via INTRAVENOUS
  Filled 2014-12-08: qty 2

## 2014-12-08 MED ORDER — SODIUM CHLORIDE 0.9 % IV BOLUS (SEPSIS)
1000.0000 mL | Freq: Once | INTRAVENOUS | Status: AC
  Administered 2014-12-08: 1000 mL via INTRAVENOUS

## 2014-12-08 MED ORDER — CYCLOBENZAPRINE HCL 10 MG PO TABS: 10.0000 mg | ORAL_TABLET | Freq: Two times a day (BID) | ORAL | Status: DC | PRN

## 2014-12-08 MED ORDER — DIPHENHYDRAMINE HCL 50 MG/ML IJ SOLN
25.0000 mg | Freq: Once | INTRAMUSCULAR | Status: AC
  Administered 2014-12-08: 25 mg via INTRAVENOUS
  Filled 2014-12-08: qty 1

## 2014-12-08 MED ORDER — CYCLOBENZAPRINE HCL 10 MG PO TABS
10.0000 mg | ORAL_TABLET | Freq: Once | ORAL | Status: AC
  Administered 2014-12-08: 10 mg via ORAL
  Filled 2014-12-08: qty 1

## 2014-12-08 MED ORDER — DEXAMETHASONE SODIUM PHOSPHATE 10 MG/ML IJ SOLN
10.0000 mg | Freq: Once | INTRAMUSCULAR | Status: AC
  Administered 2014-12-08: 10 mg via INTRAVENOUS
  Filled 2014-12-08: qty 1

## 2014-12-08 MED ORDER — PROMETHAZINE HCL 25 MG PO TABS: 25.0000 mg | ORAL_TABLET | Freq: Four times a day (QID) | ORAL | Status: DC | PRN

## 2014-12-08 NOTE — MAU Note (Signed)
Patient presents at [redacted] weeks gestation with c/o UTI. Left flank pain. Fetus active. Denies bleeding or discharge.

## 2014-12-08 NOTE — MAU Provider Note (Signed)
History     CSN: [id number]  Arrival date and time: 12/08/14 1319   None     Chief Complaint  Patient presents with  . Urinary Tract Infection   HPIpt is 24 yo G3P2002 at [redacted]w[redacted]d pregnancy who presents with Left flank pain and pressure with urination. Pt has hx of pyelonephritis. Pt denies spotting, bleeding chills or fever. Pt has had some nausea and vomiting but is better than 1st trimester.   Pt also c/o of headache with light sensitivity.  Pt's headache especially left mandible radiating to left ear. Pt is able to drink water. RN note;  Signed  MAU Note 12/08/2014 1:49 PM    Expand All Collapse All   Patient presents at [redacted] weeks gestation with c/o UTI. Left flank pain. Fetus active. Denies bleeding or discharge.       Past Medical History  Diagnosis Date  . Seasonal allergies   . Asthma     childhood  . Pyelonephritis     DURING AND BEFORE PREG;WAS HOSPITALIZED PREVIOUSLY  . Infection     YEAST INF;NOT FREQ  . Headache(784.0)     SINCE CURRENT PREGNANCY  . Polyhydramnios 2010    WAS GETTING REG NST'S DURING PREG  . Pregnancy induced hypertension 2010    NO MEDS;HAD INCREASED STESS  . Irregular periods/menstrual cycles 12/18/2011  . Unsure LMP 12/18/2011    EDC by [redacted]w[redacted]d u/s at [facility]    Past Surgical History  Procedure Laterality Date  . Vaginal delivery    . No past surgeries      Family History  Problem Relation Age of Onset  . Asthma Sister   . Asthma Son   . Asthma Brother     X 2  . Nephrolithiasis Mother     HAS HAD 2-3 SURGERIES TO REMOVE    Social History  Substance Use Topics  . Smoking status: Former Smoker    Types: Cigarettes    Quit date: 05/20/2011  . Smokeless tobacco: Never Used  . Alcohol Use: No    Allergies: No Known Allergies  Prescriptions prior to admission  Medication Sig Dispense Refill Last Dose  . Prenatal Vit-Fe Fumarate-FA (PRENATAL MULTIVITAMIN) TABS tablet Take 1 tablet by mouth daily at 12 noon.   Past Week at  Unknown time    Review of Systems  Constitutional: Negative for fever and chills.  HENT: Positive for ear pain. Negative for congestion and sore throat.   Gastrointestinal: Positive for nausea and vomiting. Negative for abdominal pain, diarrhea and constipation.  Genitourinary: Positive for flank pain. Negative for dysuria, urgency and frequency.  Musculoskeletal: Positive for back pain.  Neurological: Positive for headaches.   Physical Exam   Blood pressure 102/48, pulse 79, temperature 98.6 F (37 C), temperature source Oral, resp. rate 16, height 5\' 3"  (1.6 m), weight 148 lb (67.132 kg), last menstrual period 08/12/2014.  Physical Exam  Nursing note and vitals reviewed. Constitutional: She is oriented to person, place, and time. She appears well-developed and well-nourished. No distress.  HENT:  Head: Normocephalic.  Reddened canals bilaterally; left ear drum occluded with was- drum not visualized with otoscope; right ear drum normal but reddened canal  Eyes: Pupils are equal, round, and reactive to light.  Neck: Normal range of motion. Neck supple.  Cardiovascular: Normal rate and regular rhythm.   Respiratory: Effort normal and breath sounds normal. No respiratory distress. She has no wheezes. She has no rales.  GI: Soft. She exhibits no distension. There is no  tenderness. There is no rebound and no guarding.  FHT 158 with doppler  Musculoskeletal: Normal range of motion.  Neurological: She is alert and oriented to person, place, and time.  Skin: Skin is warm and dry.  Psychiatric: She has a normal mood and affect.    MAU Course  Procedures Results for orders placed or performed during the hospital encounter of 12/08/14 (from the past 24 hour(s))  Urinalysis, Routine w reflex microscopic (not at [facility])     Status: Abnormal   Collection Time: 12/08/14  1:30 PM  Result Value Ref Range   Color, Urine YELLOW YELLOW   APPearance CLEAR CLEAR   Specific Gravity, Urine 1.015  1.005 - 1.030   pH 6.0 5.0 - 8.0   Glucose, UA NEGATIVE NEGATIVE mg/dL   Hgb urine dipstick NEGATIVE NEGATIVE   Bilirubin Urine NEGATIVE NEGATIVE   Ketones, ur 15 (A) NEGATIVE mg/dL   Protein, ur NEGATIVE NEGATIVE mg/dL   Urobilinogen, UA 1.0 0.0 - 1.0 mg/dL   Nitrite NEGATIVE NEGATIVE   Leukocytes, UA NEGATIVE NEGATIVE  CBC with Differential     Status: Abnormal   Collection Time: 12/08/14  2:38 PM  Result Value Ref Range   WBC 10.4 4.0 - 10.5 K/uL   RBC 3.64 (L) 3.87 - 5.11 MIL/uL   Hemoglobin 10.3 (L) 12.0 - 15.0 g/dL   HCT 16.1 (L) 09.6 - 04.5 %   MCV 85.7 78.0 - 100.0 fL   MCH 28.3 26.0 - 34.0 pg   MCHC 33.0 30.0 - 36.0 g/dL   RDW 40.9 81.1 - 91.4 %   Platelets 180 150 - 400 K/uL   Neutrophils Relative % 74 43 - 77 %   Neutro Abs 7.7 1.7 - 7.7 K/uL   Lymphocytes Relative 17 12 - 46 %   Lymphs Abs 1.7 0.7 - 4.0 K/uL   Monocytes Relative 6 3 - 12 %   Monocytes Absolute 0.6 0.1 - 1.0 K/uL   Eosinophils Relative 3 0 - 5 %   Eosinophils Absolute 0.3 0.0 - 0.7 K/uL   Basophils Relative 0 0 - 1 %   Basophils Absolute 0.0 0.0 - 0.1 K/uL  US Renal  12/08/2014   CLINICAL DATA:  Left flank pain. History of pyelonephritis. Sixteen weeks pregnant.  EXAM: RENAL / URINARY TRACT ULTRASOUND COMPLETE  COMPARISON:  None.  FINDINGS: Right Kidney:  Length: 9.3 cm. Echogenicity within normal limits. No mass or hydronephrosis visualized.  Left Kidney:  Length: 9.4 cm. Echogenicity within normal limits. No mass or hydronephrosis visualized.  Bladder:  Appears normal for degree of bladder distention.  IMPRESSION: Negative   Electronically Signed   By: Marlan Palau M.D.   On: 12/08/2014 15:31  Headache cocktail given for headache- Benadryl , decadron  and reglan 10 mg IV 1 liter of NS given Headache resolved but backache persists- Flexeril  PO given Urine culture pending Assessment and Plan  Back pain in pregnancy 2nd trimester Normal urine and renal ultrasound- sent urine culture to  confirm Possible muscular skeletal- Rx Flexeril - back exercises Return if fever or increase in pain- drink lots of fluids Headache in pregnancy Possible ear infection- Debrox for 3 days if sx persist- cortisporin ear drops Rx F/u with OB appointment on Thursday at [facility] 12/08/2014, 2:13 PM

## 2014-12-10 LAB — URINE CULTURE

## 2014-12-22 ENCOUNTER — Ambulatory Visit (HOSPITAL_COMMUNITY)
Admission: RE | Admit: 2014-12-22 | Discharge: 2014-12-22 | Disposition: A | Discharge status: Home / Self Care | Payer: Medicaid Other | Source: Ambulatory Visit | Attending: Nurse Practitioner | Admitting: Nurse Practitioner

## 2014-12-22 DIAGNOSIS — Z3A18 18 weeks gestation of pregnancy: Secondary | ICD-10-CM | POA: Diagnosis not present

## 2014-12-22 DIAGNOSIS — Z3689 Encounter for other specified antenatal screening: Secondary | ICD-10-CM

## 2014-12-22 DIAGNOSIS — Z36 Encounter for antenatal screening of mother: Secondary | ICD-10-CM | POA: Diagnosis not present

## 2015-03-28 ENCOUNTER — Inpatient Hospital Stay (HOSPITAL_COMMUNITY)
Admission: AD | Admit: 2015-03-28 | Discharge: 2015-03-28 | Disposition: A | Discharge status: Home / Self Care | Payer: Medicaid Other | Source: Ambulatory Visit | Attending: Obstetrics and Gynecology | Admitting: Obstetrics and Gynecology

## 2015-03-28 ENCOUNTER — Encounter (HOSPITAL_COMMUNITY): Payer: Self-pay | Admitting: *Deleted

## 2015-03-28 DIAGNOSIS — Z3A32 32 weeks gestation of pregnancy: Secondary | ICD-10-CM | POA: Diagnosis not present

## 2015-03-28 DIAGNOSIS — O133 Gestational [pregnancy-induced] hypertension without significant proteinuria, third trimester: Secondary | ICD-10-CM

## 2015-03-28 DIAGNOSIS — I10 Essential (primary) hypertension: Secondary | ICD-10-CM | POA: Diagnosis present

## 2015-03-28 DIAGNOSIS — R030 Elevated blood-pressure reading, without diagnosis of hypertension: Secondary | ICD-10-CM | POA: Insufficient documentation

## 2015-03-28 DIAGNOSIS — O139 Gestational [pregnancy-induced] hypertension without significant proteinuria, unspecified trimester: Secondary | ICD-10-CM

## 2015-03-28 DIAGNOSIS — Z87891 Personal history of nicotine dependence: Secondary | ICD-10-CM | POA: Diagnosis not present

## 2015-03-28 DIAGNOSIS — Z825 Family history of asthma and other chronic lower respiratory diseases: Secondary | ICD-10-CM | POA: Diagnosis not present

## 2015-03-28 DIAGNOSIS — O9989 Other specified diseases and conditions complicating pregnancy, childbirth and the puerperium: Secondary | ICD-10-CM | POA: Insufficient documentation

## 2015-03-28 LAB — URINALYSIS, ROUTINE W REFLEX MICROSCOPIC
BILIRUBIN URINE: NEGATIVE
Glucose, UA: NEGATIVE mg/dL
HGB URINE DIPSTICK: NEGATIVE
KETONES UR: NEGATIVE mg/dL
Leukocytes, UA: NEGATIVE
NITRITE: NEGATIVE
PH: 7 (ref 5.0–8.0)
Protein, ur: NEGATIVE mg/dL
SPECIFIC GRAVITY, URINE: 1.01 (ref 1.005–1.030)

## 2015-03-28 NOTE — MAU Note (Signed)
Urine sent to Lab

## 2015-03-28 NOTE — MAU Provider Note (Signed)
MAU HISTORY AND PHYSICAL  Chief Complaint:  Hypertension   Azyah Flett is a 24 y.o.  [id number] with IUP at [redacted]w[redacted]d presenting for Hypertension  At last prenatal visit, 2 days at [facility], BP was elevated to 140s/90s. Was elevated on repeat. No other elevated BP this pregnancy. No BP elevations prior pregnancies. Denies HA, vision change, ruq pain, sob. Was told by [facility] to present here today for BP check. Positive FM, no bleeding or LOF. Occasional mild ctxn.   Past Medical History  Diagnosis Date  . Seasonal allergies   . Asthma     childhood  . Pyelonephritis     DURING AND BEFORE PREG;WAS HOSPITALIZED PREVIOUSLY  . Infection     YEAST INF;NOT FREQ  . Headache(784.0)     SINCE CURRENT PREGNANCY  . Polyhydramnios 2010    WAS GETTING REG NST'S DURING PREG  . Pregnancy induced hypertension 2010    NO MEDS;HAD INCREASED STESS  . Irregular periods/menstrual cycles 12/18/2011  . Unsure LMP 12/18/2011    EDC by [redacted]w[redacted]d u/s at [facility]    Past Surgical History  Procedure Laterality Date  . Vaginal delivery    . No past surgeries      Family History  Problem Relation Age of Onset  . Asthma Sister   . Asthma Son   . Asthma Brother     X 2  . Nephrolithiasis Mother     HAS HAD 2-3 SURGERIES TO REMOVE    Social History  Substance Use Topics  . Smoking status: Former Smoker    Types: Cigarettes    Quit date: 05/20/2011  . Smokeless tobacco: Never Used  . Alcohol Use: No    No Known Allergies  Prescriptions prior to admission  Medication Sig Dispense Refill Last Dose  . Prenatal Vit-Fe Fumarate-FA (PRENATAL MULTIVITAMIN) TABS tablet Take 1 tablet by mouth daily at 12 noon.   03/28/2015 at Unknown time  . cyclobenzaprine (FLEXERIL) 10 MG tablet Take 1 tablet (10 mg total) by mouth 2 (two) times daily as needed for muscle spasms. (Patient not taking: Reported on 03/28/2015) 20 tablet 0 Not Taking at Unknown time  . neomycin-polymyxin-hydrocortisone (CORTISPORIN) 3.5-10000-1 otic  suspension Place 4 drops into the left ear 3 (three) times daily. (Patient not taking: Reported on 03/28/2015) 10 mL 0 Completed Course at Unknown time  . promethazine (PHENERGAN) 25 MG tablet Take 1 tablet (25 mg total) by mouth every 6 (six) hours as needed for nausea or vomiting. (Patient not taking: Reported on 03/28/2015) 30 tablet 0 Not Taking at Unknown time    Review of Systems - Negative except for what is mentioned in HPI.  Physical Exam  Blood pressure 91/66, pulse 77, temperature 98.8 F (37.1 C), temperature source Oral, resp. rate 16, weight 174 lb (78.926 kg), last menstrual period 08/12/2014. GENERAL: Well-developed, well-nourished female in no acute distress.  LUNGS: Clear to auscultation bilaterally.  HEART: Regular rate and rhythm. ABDOMEN: Soft, nontender, nondistended, gravid.  EXTREMITIES: Nontender, no edema, 2+ distal pulses. Neuro: 1+ patellar and biceps reflexes FHT:  140/mod/+a/-d Contractions: none   Labs: Results for orders placed or performed during the hospital encounter of 03/28/15 (from the past 24 hour(s))  Urinalysis, Routine w reflex microscopic (not at [facility])   Collection Time: 03/28/15 11:35 AM  Result Value Ref Range   Color, Urine YELLOW YELLOW   APPearance CLEAR CLEAR   Specific Gravity, Urine 1.010 1.005 - 1.030   pH 7.0 5.0 - 8.0   Glucose, UA NEGATIVE  NEGATIVE mg/dL   Hgb urine dipstick NEGATIVE NEGATIVE   Bilirubin Urine NEGATIVE NEGATIVE   Ketones, ur NEGATIVE NEGATIVE mg/dL   Protein, ur NEGATIVE NEGATIVE mg/dL   Nitrite NEGATIVE NEGATIVE   Leukocytes, UA NEGATIVE NEGATIVE    Imaging Studies:  No results found.  Assessment: Suzan GaribaldiQueen Beecham is  24 y.o. G3P2002 at 3750w4d presents for BP check. Mild bp elevation, asymptomatic, 2 days ago. Otherwise no history HTN this or other pregnancies. No symptoms preeclampsia. Here BP monitored serially for one hour and all values well within normal limits. Urinalysis shows no protein.   Plan: - d/c  with preeclampsia return precautions and [facility] OB f/u this week as scheduled  Silvano Bilisoah B Aubrielle Stroud 12/24/201612:47 PM

## 2015-03-28 NOTE — Discharge Instructions (Signed)
Hypertension During Pregnancy °Hypertension is also called high blood pressure. Blood pressure moves blood in your body. Sometimes, the force that moves the blood becomes too strong. When you are pregnant, this condition should be watched carefully. It can cause problems for you and your baby. °HOME CARE  °· Make and keep all of your doctor visits. °· Take medicine as told by your doctor. Tell your doctor about all medicines you take. °· Eat very little salt. °· Exercise regularly. °· Do not drink alcohol. °· Do not smoke. °· Do not have drinks with caffeine. °· Lie on your left side when resting. °· Your health care provider may ask you to take one low-dose aspirin (81mg) each day. °GET HELP RIGHT AWAY IF: °· You have bad belly (abdominal) pain. °· You have sudden puffiness (swelling) in the hands, ankles, or face. °· You gain 4 pounds (1.8 kilograms) or more in 1 week. °· You throw up (vomit) repeatedly. °· You have bleeding from the vagina. °· You do not feel the baby moving as much. °· You have a headache. °· You have blurred or double vision. °· You have muscle twitching or spasms. °· You have shortness of breath. °· You have blue fingernails and lips. °· You have blood in your pee (urine). °MAKE SURE YOU: °· Understand these instructions. °· Will watch your condition. °· Will get help right away if you are not doing well or get worse. °  °This information is not intended to replace advice given to you by your health care provider. Make sure you discuss any questions you have with your health care provider. °  °Document Released: 04/23/2010 Document Revised: 04/11/2014 Document Reviewed: 10/18/2012 °Elsevier Interactive Patient Education ©2016 [employer] Inc[employer] ° °

## 2015-03-28 NOTE — MAU Note (Signed)
Pt's BP was elevated when at last visit.  Instructed to come in for BP check.  Denies HA, visual changes, epigastric pain or increase in swelling.  Denies bleeding or leaking, has runs of  [employer] Corporation[employer]xton Hicks.

## 2015-04-05 NOTE — L&D Delivery Note (Signed)
Patient is 25 y.o. [id number] [redacted]w[redacted]d admitted IOL for GHTN. BP have been WNL. Induction with Cytotec then Pit.    Delivery Note At 12:50 PM a viable female was delivered via Vaginal, Spontaneous Delivery (Presentation: Right Occiput Anterior).  APGAR: 8, 9; weight: pending .   Placenta status: Intact, Spontaneous.  Cord: 3 vessels with the following complications: None.  Cord pH: not obtained  Anesthesia: Epidural  Episiotomy: None Lacerations: Labial Suture Repair: none Est. Blood Loss (mL): 100  Mom to postpartum.  Baby to Couplet care / Skin to Skin.  Palma Holter 05/14/2015, 1:30 PM   Upon arrival patient was complete and pushing. She pushed with good maternal effort to deliver a healthy baby boy. Baby delivered without difficulty, was noted to have good tone and place on maternal abdomen for oral suctioning, drying and stimulation. Delayed cord clamping performed. Placenta delivered intact with 3V cord. Vaginal canal and perineum was inspected and only periurethral abrasions were noted which did not require repair; hemostatic. Pitocin was started and uterus massaged until bleeding slowed. Counts of sharps, instruments, and lap pads were all correct.   Palma Holter, MD PGY 1 Family Medicine

## 2015-04-22 LAB — OB RESULTS CONSOLE GBS: STREP GROUP B AG: NEGATIVE

## 2015-04-29 ENCOUNTER — Encounter (HOSPITAL_COMMUNITY): Payer: Self-pay

## 2015-04-29 ENCOUNTER — Inpatient Hospital Stay (HOSPITAL_COMMUNITY)
Admission: AD | Admit: 2015-04-29 | Discharge: 2015-04-29 | Disposition: A | Discharge status: Home / Self Care | Payer: Medicaid Other | Source: Ambulatory Visit | Attending: Obstetrics & Gynecology | Admitting: Obstetrics & Gynecology

## 2015-04-29 DIAGNOSIS — O479 False labor, unspecified: Secondary | ICD-10-CM

## 2015-04-29 DIAGNOSIS — Z3A37 37 weeks gestation of pregnancy: Secondary | ICD-10-CM | POA: Insufficient documentation

## 2015-04-29 DIAGNOSIS — O471 False labor at or after 37 completed weeks of gestation: Secondary | ICD-10-CM | POA: Insufficient documentation

## 2015-04-29 LAB — POCT FERN TEST: POCT Fern Test: NEGATIVE

## 2015-04-29 NOTE — MAU Provider Note (Signed)
Chief Complaint:  Vaginal Discharge and Contractions   None     HPI: Theresa Lawson is a 25 y.o. G3P2002 at [redacted]w[redacted]d who presents to maternity admissions reporting leakage of fluid starting today, enough to wet her underwear but not requiring a pad.  She reports irregular mild abdominal cramping but denies regular contractions.   She reports good fetal movement, denies vaginal bleeding, vaginal itching/burning, urinary symptoms, h/a, dizziness, n/v, or fever/chills.    HPI  Past Medical History: Past Medical History  Diagnosis Date  . Seasonal allergies   . Asthma     childhood  . Pyelonephritis     DURING AND BEFORE PREG;WAS HOSPITALIZED PREVIOUSLY  . Infection     YEAST INF;NOT FREQ  . Headache(784.0)     SINCE CURRENT PREGNANCY  . Polyhydramnios 2010    WAS GETTING REG NST'S DURING PREG  . Pregnancy induced hypertension 2010    NO MEDS;HAD INCREASED STESS  . Irregular periods/menstrual cycles 12/18/2011  . Unsure LMP 12/18/2011    EDC by [redacted]w[redacted]d u/s at [facility]    Past obstetric history: OB History  Gravida Para Term Preterm AB SAB TAB Ectopic Multiple Living  0 0 0 0 0 0 2    # Outcome Date GA Lbr Len/2nd Weight Sex Delivery Anes PTL Lv  3 Current           2 Term 01/19/12 [redacted]w[redacted]d / 00:26 7 lb 6.9 oz (3.371 kg) F Vag-Spont EPI  Y     Comments: wnl  1 Term 09/21/08 [redacted]w[redacted]d 02:00 8 lb 7 oz (3.827 kg) M Vag-Spont EPI  Y     Comments: NO COMPLICATIONS      Past Surgical History: Past Surgical History  Procedure Laterality Date  . Vaginal delivery    . No past surgeries      Family History: Family History  Problem Relation Age of Onset  . Asthma Sister   . Asthma Son   . Asthma Brother     X 2  . Nephrolithiasis Mother     HAS HAD 2-3 SURGERIES TO REMOVE    Social History: Social History  Substance Use Topics  . Smoking status: Former Smoker    Types: Cigarettes    Quit date: 05/20/2011  . Smokeless tobacco: Never Used  . Alcohol Use: No    Allergies: No  Known Allergies  Meds:  Prescriptions prior to admission  Medication Sig Dispense Refill Last Dose  . Prenatal Vit-Fe Fumarate-FA (PRENATAL MULTIVITAMIN) TABS tablet Take 1 tablet by mouth daily at 12 noon.   Past Week at Unknown time  . cyclobenzaprine (FLEXERIL) 10 MG tablet Take 1 tablet (10 mg total) by mouth 2 (two) times daily as needed for muscle spasms. (Patient not taking: Reported on 03/28/2015) 20 tablet 0 Not Taking at Unknown time  . neomycin-polymyxin-hydrocortisone (CORTISPORIN) 3.5-10000-1 otic suspension Place 4 drops into the left ear 3 (three) times daily. (Patient not taking: Reported on 03/28/2015) 10 mL 0 Completed Course at Unknown time  . promethazine (PHENERGAN) 25 MG tablet Take 1 tablet (25 mg total) by mouth every 6 (six) hours as needed for nausea or vomiting. (Patient not taking: Reported on 03/28/2015) 30 tablet 0 Not Taking at Unknown time    ROS:  Review of Systems  Constitutional: Negative for fever, chills and fatigue.  Eyes: Negative for visual disturbance.  Respiratory: Negative for shortness of breath.   Cardiovascular: Negative for chest pain.  Gastrointestinal: Negative for nausea, vomiting and abdominal  pain.  Genitourinary: Negative for dysuria, flank pain, vaginal bleeding, vaginal discharge, difficulty urinating, vaginal pain and pelvic pain.  Neurological: Negative for dizziness and headaches.  Psychiatric/Behavioral: Negative.      I have reviewed patient's Past Medical Hx, Surgical Hx, Family Hx, Social Hx, medications and allergies.   Physical Exam   Patient Vitals for the past 24 hrs:  BP Temp Temp src Pulse Resp Weight  04/29/15 1808 124/76 mmHg 98.1 F (36.7 C) Oral 102 16 -  04/29/15 1711 139/79 mmHg 98.5 F (36.9 C) Oral 97 18 178 lb 9.6 oz (81.012 kg)   Constitutional: Well-developed, well-nourished female in no acute distress.  Cardiovascular: normal rate Respiratory: normal effort GI: Abd soft, non-tender, gravid  appropriate for gestational age.  MS: Extremities nontender, no edema, normal ROM Neurologic: Alert and oriented x 4.  GU: Neg CVAT.  PELVIC EXAM: Cervix pink, visually closed, without lesion, scant white creamy discharge, negative pooling with valsalva  Dilation: 1 Effacement (%): 50 Cervical Position: Posterior Exam by:: Collene Gobble CNM   FHT:  Baseline 135 , moderate variability, accelerations present, no decelerations Contractions: rare, mild to palpation    Labs: Results for orders placed or performed during the hospital encounter of 04/29/15 (from the past 24 hour(s))  Fern Test     Status: None   Collection Time: 04/29/15  5:45 PM  Result Value Ref Range   POCT Fern Test Negative = intact amniotic membranes       Imaging:  No results found.  MAU Course/MDM: No evidence of ruptured membranes or labor with cervix unchanged from recent office visit.  NST reactive.  Labor precautions given, pt stable at time of discharge.  Assessment: 1. Threatened labor at term     Plan: Discharge home Labor precautions and fetal kick counts    Medication List    ASK your doctor about these medications        cyclobenzaprine 10 MG tablet  Commonly known as:  FLEXERIL  Take 1 tablet (10 mg total) by mouth 2 (two) times daily as needed for muscle spasms.     neomycin-polymyxin-hydrocortisone 3.5-10000-1 otic suspension  Commonly known as:  CORTISPORIN  Place 4 drops into the left ear 3 (three) times daily.     prenatal multivitamin Tabs tablet  Take 1 tablet by mouth daily at 12 noon.     promethazine 25 MG tablet  Commonly known as:  PHENERGAN  Take 1 tablet (25 mg total) by mouth every 6 (six) hours as needed for nausea or vomiting.        Sharen Counter Certified Nurse-Midwife 04/29/2015 6:26 PM

## 2015-04-29 NOTE — Discharge Instructions (Signed)
Fetal Movement Counts Patient Name: __________________________________________________ Patient Due Date: ____________________ Performing a fetal movement count is highly recommended in high-risk pregnancies, but it is good for every pregnant woman to do. Your health care provider may ask you to start counting fetal movements at 28 weeks of the pregnancy. Fetal movements often increase:  After eating a full meal.  After physical activity.  After eating or drinking something sweet or cold.  At rest. Pay attention to when you feel the baby is most active. This will help you notice a pattern of your baby's sleep and wake cycles and what factors contribute to an increase in fetal movement. It is important to perform a fetal movement count at the same time each day when your baby is normally most active.  HOW TO COUNT FETAL MOVEMENTS 1. Find a quiet and comfortable area to sit or lie down on your left side. Lying on your left side provides the best blood and oxygen circulation to your baby. 2. Write down the day and time on a sheet of paper or in a journal. 3. Start counting kicks, flutters, swishes, rolls, or jabs in a 2-hour period. You should feel at least 10 movements within 2 hours. 4. If you do not feel 10 movements in 2 hours, wait 2-3 hours and count again. Look for a change in the pattern or not enough counts in 2 hours. SEEK MEDICAL CARE IF:  You feel less than 10 counts in 2 hours, tried twice.  There is no movement in over an hour.  The pattern is changing or taking longer each day to reach 10 counts in 2 hours.  You feel the baby is not moving as he or she usually does. Date: ____________ Movements: ____________ Start time: ____________ Finish time: ____________  Date: ____________ Movements: ____________ Start time: ____________ Finish time: ____________ Date: ____________ Movements: ____________ Start time: ____________ Finish time: ____________ Date: ____________ Movements:  ____________ Start time: ____________ Finish time: ____________ Date: ____________ Movements: ____________ Start time: ____________ Finish time: ____________ Date: ____________ Movements: ____________ Start time: ____________ Finish time: ____________ Date: ____________ Movements: ____________ Start time: ____________ Finish time: ____________ Date: ____________ Movements: ____________ Start time: ____________ Finish time: ____________  Date: ____________ Movements: ____________ Start time: ____________ Finish time: ____________ Date: ____________ Movements: ____________ Start time: ____________ Finish time: ____________ Date: ____________ Movements: ____________ Start time: ____________ Finish time: ____________ Date: ____________ Movements: ____________ Start time: ____________ Finish time: ____________ Date: ____________ Movements: ____________ Start time: ____________ Finish time: ____________ Date: ____________ Movements: ____________ Start time: ____________ Finish time: ____________ Date: ____________ Movements: ____________ Start time: ____________ Finish time: ____________  Date: ____________ Movements: ____________ Start time: ____________ Finish time: ____________ Date: ____________ Movements: ____________ Start time: ____________ Finish time: ____________ Date: ____________ Movements: ____________ Start time: ____________ Finish time: ____________ Date: ____________ Movements: ____________ Start time: ____________ Finish time: ____________ Date: ____________ Movements: ____________ Start time: ____________ Finish time: ____________ Date: ____________ Movements: ____________ Start time: ____________ Finish time: ____________ Date: ____________ Movements: ____________ Start time: ____________ Finish time: ____________  Date: ____________ Movements: ____________ Start time: ____________ Finish time: ____________ Date: ____________ Movements: ____________ Start time: ____________ Finish  time: ____________ Date: ____________ Movements: ____________ Start time: ____________ Finish time: ____________ Date: ____________ Movements: ____________ Start time: ____________ Finish time: ____________ Date: ____________ Movements: ____________ Start time: ____________ Finish time: ____________ Date: ____________ Movements: ____________ Start time: ____________ Finish time: ____________ Date: ____________ Movements: ____________ Start time: ____________ Finish time: ____________  Date: ____________ Movements: ____________ Start time: ____________ Finish   time: ____________ Date: ____________ Movements: ____________ Start time: ____________ Finish time: ____________ Date: ____________ Movements: ____________ Start time: ____________ Finish time: ____________ Date: ____________ Movements: ____________ Start time: ____________ Finish time: ____________ Date: ____________ Movements: ____________ Start time: ____________ Finish time: ____________ Date: ____________ Movements: ____________ Start time: ____________ Finish time: ____________ Date: ____________ Movements: ____________ Start time: ____________ Finish time: ____________  Date: ____________ Movements: ____________ Start time: ____________ Finish time: ____________ Date: ____________ Movements: ____________ Start time: ____________ Finish time: ____________ Date: ____________ Movements: ____________ Start time: ____________ Finish time: ____________ Date: ____________ Movements: ____________ Start time: ____________ Finish time: ____________ Date: ____________ Movements: ____________ Start time: ____________ Finish time: ____________ Date: ____________ Movements: ____________ Start time: ____________ Finish time: ____________ Date: ____________ Movements: ____________ Start time: ____________ Finish time: ____________  Date: ____________ Movements: ____________ Start time: ____________ Finish time: ____________ Date: ____________  Movements: ____________ Start time: ____________ Finish time: ____________ Date: ____________ Movements: ____________ Start time: ____________ Finish time: ____________ Date: ____________ Movements: ____________ Start time: ____________ Finish time: ____________ Date: ____________ Movements: ____________ Start time: ____________ Finish time: ____________ Date: ____________ Movements: ____________ Start time: ____________ Finish time: ____________ Date: ____________ Movements: ____________ Start time: ____________ Finish time: ____________  Date: ____________ Movements: ____________ Start time: ____________ Finish time: ____________ Date: ____________ Movements: ____________ Start time: ____________ Finish time: ____________ Date: ____________ Movements: ____________ Start time: ____________ Finish time: ____________ Date: ____________ Movements: ____________ Start time: ____________ Finish time: ____________ Date: ____________ Movements: ____________ Start time: ____________ Finish time: ____________ Date: ____________ Movements: ____________ Start time: ____________ Finish time: ____________   This information is not intended to replace advice given to you by your health care provider. Make sure you discuss any questions you have with your health care provider.   Document Released: 04/20/2006 Document Revised: 04/11/2014 Document Reviewed: 01/16/2012 Elsevier Interactive Patient Education 2016 Else[employer] Inc[employer] Braxton Hicks Contractions Contractions of the uterus can occur throughout pregnancy. Contractions are not always a sign that you are in labor.  WHAT ARE BRAXTON HICKS CONTRACTIONS?  Contractions that occur before labor are called Braxton Hicks contractions, or false labor. Toward the end of pregnancy (32-34 weeks), these contractions can develop more often and may become more forceful. This is not true labor because these contractions do not result in opening (dilatation) and thinning of  the cervix. They are sometimes difficult to tell apart from true labor because these contractions can be forceful and people have different pain tolerances. You should not feel embarrassed if you go to the hospital with false labor. Sometimes, the only way to tell if you are in true labor is for your health care provider to look for changes in the cervix. If there are no prenatal problems or other health problems associated with the pregnancy, it is completely safe to be sent home with false labor and await the onset of true labor. HOW CAN YOU TELL THE DIFFERENCE BETWEEN TRUE AND FALSE LABOR? False Labor  The contractions of false labor are usually shorter and not as hard as those of true labor.   The contractions are usually irregular.   The contractions are often felt in the front of the lower abdomen and in the groin.   The contractions may go away when you walk around or change positions while lying down.   The contractions get weaker and are shorter lasting as time goes on.   The contractions do not usually become progressively stronger, regular, and closer together as with true labor.  True Labor 5. Contractions in true   labor last 30-70 seconds, become very regular, usually become more intense, and increase in frequency.  6. The contractions do not go away with walking.  7. The discomfort is usually felt in the top of the uterus and spreads to the lower abdomen and low back.  8. True labor can be determined by your health care provider with an exam. This will show that the cervix is dilating and getting thinner.  WHAT TO REMEMBER  Keep up with your usual exercises and follow other instructions given by your health care provider.   Take medicines as directed by your health care provider.   Keep your regular prenatal appointments.   Eat and drink lightly if you think you are going into labor.   If Braxton Hicks contractions are making you uncomfortable:   Change  your position from lying down or resting to walking, or from walking to resting.   Sit and rest in a tub of warm water.   Drink 2-3 glasses of water. Dehydration may cause these contractions.   Do slow and deep breathing several times an hour.  WHEN SHOULD I SEEK IMMEDIATE MEDICAL CARE? Seek immediate medical care if:  Your contractions become stronger, more regular, and closer together.   You have fluid leaking or gushing from your vagina.   You have a fever.   You pass blood-tinged mucus.   You have vaginal bleeding.   You have continuous abdominal pain.   You have low back pain that you never had before.   You feel your baby's head pushing down and causing pelvic pressure.   Your baby is not moving as much as it used to.    This information is not intended to replace advice given to you by your health care provider. Make sure you discuss any questions you have with your health care provider.   Document Released: 03/21/2005 Document Revised: 03/26/2013 Document Reviewed: 12/31/2012 Elsevier Interactive Patient Education 2016 Elsevier Inc.  

## 2015-04-29 NOTE — MAU Note (Signed)
Thinks her water broke.  Had some leaking around 1300, a lot came out initially, now just a trickle- feels wetness.  Contractions started after leaking.

## 2015-05-11 ENCOUNTER — Encounter (HOSPITAL_COMMUNITY): Payer: Self-pay | Admitting: *Deleted

## 2015-05-11 ENCOUNTER — Inpatient Hospital Stay (HOSPITAL_COMMUNITY)
Admission: AD | Admit: 2015-05-11 | Discharge: 2015-05-11 | Disposition: A | Discharge status: Home / Self Care | Payer: Medicaid Other | Source: Ambulatory Visit | Attending: Obstetrics and Gynecology | Admitting: Obstetrics and Gynecology

## 2015-05-11 DIAGNOSIS — R109 Unspecified abdominal pain: Secondary | ICD-10-CM | POA: Insufficient documentation

## 2015-05-11 DIAGNOSIS — O26899 Other specified pregnancy related conditions, unspecified trimester: Secondary | ICD-10-CM | POA: Insufficient documentation

## 2015-05-11 DIAGNOSIS — N949 Unspecified condition associated with female genital organs and menstrual cycle: Secondary | ICD-10-CM

## 2015-05-11 LAB — URINALYSIS, ROUTINE W REFLEX MICROSCOPIC
Bilirubin Urine: NEGATIVE
GLUCOSE, UA: NEGATIVE mg/dL
Hgb urine dipstick: NEGATIVE
Ketones, ur: NEGATIVE mg/dL
LEUKOCYTES UA: NEGATIVE
Nitrite: NEGATIVE
PROTEIN: NEGATIVE mg/dL
SPECIFIC GRAVITY, URINE: 1.02 (ref 1.005–1.030)
pH: 8 (ref 5.0–8.0)

## 2015-05-11 MED ORDER — CYCLOBENZAPRINE HCL 5 MG PO TABS: 5.0000 mg | ORAL_TABLET | Freq: Three times a day (TID) | ORAL | Status: DC | PRN

## 2015-05-11 NOTE — MAU Provider Note (Signed)
  History     CSN: [id number]  Arrival date and time: 05/11/15 1140   None     Chief Complaint  Patient presents with  . Contractions   HPI Patient presented to MAU for RN labor eval. Patient was monitored for over 2 hours; no changes in cervix. Patient later complained of a left lower quadrant pain that began around the same time as her contractions (10AM 2/6). She describes as a constant crampy pain in her lower abdominal/upper groin area that extends to her left hip. Symptoms worsen with prolonged standing and ambulation. Symptoms decrease in intensity with rest. Patient has not had similar symptoms in the past. Denies diarrhea/constipation, vaginal discharge. Has not tried anything for it. No fevers, chills, dysuria.   Urinalysis in MAU was unremarkable.   Past Medical History  Diagnosis Date  . Seasonal allergies   . Asthma     childhood  . Pyelonephritis     DURING AND BEFORE PREG;WAS HOSPITALIZED PREVIOUSLY  . Infection     YEAST INF;NOT FREQ  . Headache(784.0)     SINCE CURRENT PREGNANCY  . Polyhydramnios 2010    WAS GETTING REG NST'S DURING PREG  . Pregnancy induced hypertension 2010    NO MEDS;HAD INCREASED STESS  . Irregular periods/menstrual cycles 12/18/2011  . Unsure LMP 12/18/2011    EDC by [redacted]w[redacted]d u/s at [facility]    Past Surgical History  Procedure Laterality Date  . Vaginal delivery    . No past surgeries      Family History  Problem Relation Age of Onset  . Asthma Sister   . Asthma Son   . Asthma Brother     X 2  . Nephrolithiasis Mother     HAS HAD 2-3 SURGERIES TO REMOVE    Social History  Substance Use Topics  . Smoking status: Former Smoker    Types: Cigarettes    Quit date: 05/20/2011  . Smokeless tobacco: Never Used  . Alcohol Use: No    Allergies: No Known Allergies  Prescriptions prior to admission  Medication Sig Dispense Refill Last Dose  . Prenatal Vit-Fe Fumarate-FA (PRENATAL MULTIVITAMIN) TABS tablet Take 1 tablet by mouth daily  at 12 noon.   05/10/2015 at Unknown time  . cyclobenzaprine (FLEXERIL) 10 MG tablet Take 1 tablet (10 mg total) by mouth 2 (two) times daily as needed for muscle spasms. (Patient not taking: Reported on 03/28/2015) 20 tablet 0 Not Taking at Unknown time  . neomycin-polymyxin-hydrocortisone (CORTISPORIN) 3.5-10000-1 otic suspension Place 4 drops into the left ear 3 (three) times daily. (Patient not taking: Reported on 03/28/2015) 10 mL 0 Not Taking at Unknown time  . promethazine (PHENERGAN) 25 MG tablet Take 1 tablet (25 mg total) by mouth every 6 (six) hours as needed for nausea or vomiting. (Patient not taking: Reported on 03/28/2015) 30 tablet 0 Not Taking at Unknown time    ROS Physical Exam   Blood pressure 140/89, pulse 116, temperature 97.9 F (36.6 C), temperature source Oral, resp. rate 20, last menstrual period 08/12/2014.  Physical Exam Heart: RRR, no m/r/g Lungs: CTAB Abdomen: + BS; no tenderness to palpation with stethoscope; patient reports discomfort with hand palpation of LLQ, no guarding/rebound.    MAU Course  Procedures   Assessment and Plan  Symptoms consistent with round ligament pain. Given return precautions. Given Flexeril  TID PRN. Recommended support belt found at medical supple store.   Discussed with Montez Morita.   Palma Holter 05/11/2015, 3:07 PM

## 2015-05-11 NOTE — MAU Note (Signed)
UC's since last night, went to sleep and was awakened by contractions again. No leaking or bleeding.

## 2015-05-11 NOTE — MAU Note (Signed)
Dr. Ottie Glazier in to assess patient and C/O LLQ cramping.

## 2015-05-11 NOTE — MAU Note (Signed)
Patient states she feels a cramp in her LLQ that is worse when she is moving about. Patient currently up out of bed, sitting in chair.

## 2015-05-11 NOTE — Discharge Instructions (Signed)
You can also try to get a support belt (baby's r [place] may carry them or any medical supply store should carry it).   Round Ligament Pain The round ligament is a cord of muscle and tissue that helps to support the uterus. It can become a source of pain during pregnancy if it becomes stretched or twisted as the baby grows. The pain usually begins in the second trimester of pregnancy, and it can come and go until the baby is delivered. It is not a serious problem, and it does not cause harm to the baby. Round ligament pain is usually a short, sharp, and pinching pain, but it can also be a dull, lingering, and aching pain. The pain is felt in the lower side of the abdomen or in the groin. It usually starts deep in the groin and moves up to the outside of the hip area. Pain can occur with:  A sudden change in position.  Rolling over in bed.  Coughing or sneezing.  Physical activity. HOME CARE INSTRUCTIONS Watch your condition for any changes. Take these steps to help with your pain:  When the pain starts, relax. Then try:  Sitting down.  Flexing your knees up to your abdomen.  Lying on your side with one pillow under your abdomen and another pillow between your legs.  Sitting in a warm bath for 15-20 minutes or until the pain goes away.  Take over-the-counter and prescription medicines only as told by your health care provider.  Move slowly when you sit and stand.  Avoid long walks if they cause pain.  Stop or lessen your physical activities if they cause pain. SEEK MEDICAL CARE IF:  Your pain does not go away with treatment.  You feel pain in your back that you did not have before.  Your medicine is not helping. SEEK IMMEDIATE MEDICAL CARE IF:  You develop a fever or chills.  You develop uterine contractions.  You develop vaginal bleeding.  You develop nausea or vomiting.  You develop diarrhea.  You have pain when you urinate.   This information is not intended to  replace advice given to you by your health care provider. Make sure you discuss any questions you have with your health care provider.   Document Released: 12/29/2007 Document Revised: 06/13/2011 Document Reviewed: 05/28/2014 Elsevier Interactive Patient Education [employer]

## 2015-05-13 ENCOUNTER — Inpatient Hospital Stay (HOSPITAL_COMMUNITY)
Admission: AD | Admit: 2015-05-13 | Discharge: 2015-05-15 | DRG: 775 | Disposition: A | Discharge status: Home / Self Care | Payer: Medicaid Other | Source: Ambulatory Visit | Attending: Obstetrics & Gynecology | Admitting: Obstetrics & Gynecology

## 2015-05-13 ENCOUNTER — Encounter (HOSPITAL_COMMUNITY): Payer: Self-pay

## 2015-05-13 DIAGNOSIS — O134 Gestational [pregnancy-induced] hypertension without significant proteinuria, complicating childbirth: Secondary | ICD-10-CM | POA: Diagnosis present

## 2015-05-13 DIAGNOSIS — Z3A39 39 weeks gestation of pregnancy: Secondary | ICD-10-CM | POA: Diagnosis not present

## 2015-05-13 DIAGNOSIS — Z87891 Personal history of nicotine dependence: Secondary | ICD-10-CM

## 2015-05-13 DIAGNOSIS — O133 Gestational [pregnancy-induced] hypertension without significant proteinuria, third trimester: Secondary | ICD-10-CM | POA: Diagnosis present

## 2015-05-13 LAB — TYPE AND SCREEN
ABO/RH(D): O POS
ANTIBODY SCREEN: NEGATIVE

## 2015-05-13 LAB — CBC
HEMATOCRIT: 31.7 % — AB (ref 36.0–46.0)
Hemoglobin: 10.4 g/dL — ABNORMAL LOW (ref 12.0–15.0)
MCH: 28.6 pg (ref 26.0–34.0)
MCHC: 32.8 g/dL (ref 30.0–36.0)
MCV: 87.1 fL (ref 78.0–100.0)
PLATELETS: 196 10*3/uL (ref 150–400)
RBC: 3.64 MIL/uL — AB (ref 3.87–5.11)
RDW: 14.1 % (ref 11.5–15.5)
WBC: 10.4 10*3/uL (ref 4.0–10.5)

## 2015-05-13 LAB — URINALYSIS, ROUTINE W REFLEX MICROSCOPIC
Bilirubin Urine: NEGATIVE
Glucose, UA: NEGATIVE mg/dL
Hgb urine dipstick: NEGATIVE
KETONES UR: NEGATIVE mg/dL
Leukocytes, UA: NEGATIVE
Nitrite: NEGATIVE
PH: 6.5 (ref 5.0–8.0)
PROTEIN: NEGATIVE mg/dL
SPECIFIC GRAVITY, URINE: 1.015 (ref 1.005–1.030)

## 2015-05-13 LAB — COMPREHENSIVE METABOLIC PANEL
ALT: 14 U/L (ref 14–54)
AST: 22 U/L (ref 15–41)
Albumin: 3.2 g/dL — ABNORMAL LOW (ref 3.5–5.0)
Alkaline Phosphatase: 181 U/L — ABNORMAL HIGH (ref 38–126)
Anion gap: 7 (ref 5–15)
BUN: 10 mg/dL (ref 6–20)
CHLORIDE: 108 mmol/L (ref 101–111)
CO2: 22 mmol/L (ref 22–32)
CREATININE: 0.58 mg/dL (ref 0.44–1.00)
Calcium: 9.1 mg/dL (ref 8.9–10.3)
GFR calc non Af Amer: >60 mL/min (ref >60)
Glucose, Bld: 103 mg/dL — ABNORMAL HIGH (ref 65–99)
POTASSIUM: 3.7 mmol/L (ref 3.5–5.1)
SODIUM: 137 mmol/L (ref 135–145)
Total Bilirubin: 0.2 mg/dL — ABNORMAL LOW (ref 0.3–1.2)
Total Protein: 6.7 g/dL (ref 6.5–8.1)

## 2015-05-13 LAB — PROTEIN / CREATININE RATIO, URINE
Creatinine, Urine: 134 mg/dL
PROTEIN CREATININE RATIO: 0.08 mg/mg{creat} (ref 0.00–0.15)
Total Protein, Urine: 11 mg/dL

## 2015-05-13 MED ORDER — MISOPROSTOL 25 MCG QUARTER TABLET
25.0000 ug | ORAL_TABLET | ORAL | Status: DC
  Administered 2015-05-13 – 2015-05-14 (×2): 25 ug via ORAL
  Filled 2015-05-13 (×3): qty 0.25

## 2015-05-13 MED ORDER — ACETAMINOPHEN 325 MG PO TABS
650.0000 mg | ORAL_TABLET | Freq: Four times a day (QID) | ORAL | Status: DC | PRN
  Administered 2015-05-13: 650 mg via ORAL
  Filled 2015-05-13: qty 2

## 2015-05-13 MED ORDER — LACTATED RINGERS IV SOLN
INTRAVENOUS | Status: DC
  Administered 2015-05-13 – 2015-05-14 (×3): via INTRAVENOUS

## 2015-05-13 MED ORDER — ZOLPIDEM TARTRATE 5 MG PO TABS: 5.0000 mg | ORAL_TABLET | Freq: Every evening | ORAL | Status: DC | PRN

## 2015-05-13 MED ORDER — TERBUTALINE SULFATE 1 MG/ML IJ SOLN
0.2500 mg | Freq: Once | INTRAMUSCULAR | Status: DC | PRN
  Filled 2015-05-13: qty 1

## 2015-05-13 NOTE — MAU Note (Signed)
Patient presents from off with elevated BPs.

## 2015-05-13 NOTE — MAU Note (Signed)
Notified provider that patient is here.   

## 2015-05-13 NOTE — H&P (Signed)
LABOR ADMISSION HISTORY AND PHYSICAL  Theresa Lawson is a 25 y.o. female [phone] with IUP at [redacted]w[redacted]d by LMP c/w 18wk initially presented for evaluation of newly elevated blood pressures found at the health department. Patient complains of self resolving intermittent HA that started around 36 weeks. She reports +FM, + contractions, No LOF, no VB, no blurry vision, or peripheral edema, and RUQ pain.  She plans on bottle feeding. She request IUD for birth control.  At Health Department BP was 162/104. Patient reported this was obtained approx 1300. Patient continued to have elevated blood pressures in the MAU (4 hours apart). Pre-e labs were within normal limits.  Dating: By LMP c/w 18wk [place] --->  Estimated Date of Delivery: 05/19/15  Sono:    , CWD, normal anatomy, 256g, 43% EFW   Prenatal History/Complications: No complications with previous pregnancies   Past Medical History: Past Medical History  Diagnosis Date  . Seasonal allergies   . Asthma     childhood  . Pyelonephritis     DURING AND BEFORE PREG;WAS HOSPITALIZED PREVIOUSLY  . Infection     YEAST INF;NOT FREQ  . Headache(784.0)     SINCE CURRENT PREGNANCY  . Polyhydramnios 2010    WAS GETTING REG NST'S DURING PREG  . Pregnancy induced hypertension 2010    NO MEDS;HAD INCREASED STESS  . Irregular periods/menstrual cycles 12/18/2011  . Unsure LMP 12/18/2011    EDC by [redacted[facility]w[redacted]d [facility]/s at [facility]    Past Surgical History: Past Surgical History  Procedure Laterality Date  . Vaginal delivery    . No past surgeries      Obstetrical History: OB History    Gravida Para Term Preterm AB TAB SAB Ectopic Multiple Living   0 0 0 0 0 0 2      Social History: Social History   Social History  . Marital Status: Single    Spouse Name: N/A  . Number of Children: 1  . Years of Education: 11   Occupational History  . 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 Smoking status: Former Smoker   Types: Cigarettes    Quit date: 05/20/2011  . Smokeless tobacco: Never Used  . Alcohol Use: No  . Drug Use: No  . Sexual Activity:    Partners: Male    Birth Control/ Protection: None     Comment: no unprotected intercourse in past 11 days   Other Topics Concern  . None   Social History Narrative    Family History: Family History  Problem Relation Age of Onset  . Asthma Sister   . Asthma Son   . Asthma Brother     X 2  . Nephrolithiasis Mother     HAS HAD 2-3 SURGERIES TO REMOVE    Allergies: No Known Allergies  Prescriptions prior to admission  Medication Sig Dispense Refill Last Dose  . cyclobenzaprine (FLEXERIL) 5 MG tablet Take 1 tablet (5 mg total) by mouth 3 (three) times daily as needed for muscle spasms. 30 tablet 0 05/12/2015 at Unknown time  . Prenatal Vit-Fe Fumarate-FA (PRENATAL MULTIVITAMIN) TABS tablet Take 1 tablet by mouth daily at 12 noon.   05/12/2015 at Unknown time     Review of Systems   All systems reviewed and negative except as stated in HPI  BP 127/84 mmHg  Pulse 115  Temp(Src) 98.4 F (36.9 C) (Oral)  Resp 16  LMP 08/12/2014 General appearance: alert and  cooperative Lungs: clear to auscultation bilaterally Heart: regular rate and rhythm Abdomen: soft, non-tender; bowel sounds normal Extremities: Homans sign is negative, no sign of DVT, edema DTR's: +1, no clonus  Fetal monitoringBaseline: 140 bpm, Variability: Good {> 6 bpm), Accelerations: Reactive and Decelerations: Absent Uterine activity: irregular   Prenatal labs: ABO, Rh:   O pos Antibody:  neg Rubella: Immune RPR: Non Reactive (06/18 1833)  HBsAg:   Neg HIV:   NR GBS: Negative (01/18 0000)  1 hr Glucola 111 Genetic screening: QUAD neg, CF neg Anatomy US nml  Prenatal Transfer Tool  Maternal Diabetes: No Genetic Screening: Normal Maternal Ultrasounds/Referrals: Normal Fetal Ultrasounds or other Referrals:  None Maternal Substance Abuse:  No Significant Maternal  Medications:  None Significant Maternal Lab Results: Lab values include: Group B Strep negative  Results for orders placed or performed during the hospital encounter of 05/13/15 (from the past 24 hour(s))  Urinalysis, Routine w reflex microscopic (not at [facility])   Collection Time: 05/13/15  3:19 PM  Result Value Ref Range   Color, Urine YELLOW YELLOW   APPearance CLEAR CLEAR   Specific Gravity, Urine 1.015 1.005 - 1.030   pH 6.5 5.0 - 8.0   Glucose, UA NEGATIVE NEGATIVE mg/dL   Hgb urine dipstick NEGATIVE NEGATIVE   Bilirubin Urine NEGATIVE NEGATIVE   Ketones, ur NEGATIVE NEGATIVE mg/dL   Protein, ur NEGATIVE NEGATIVE mg/dL   Nitrite NEGATIVE NEGATIVE   Leukocytes, UA NEGATIVE NEGATIVE  Protein / creatinine ratio, urine   Collection Time: 05/13/15  3:19 PM  Result Value Ref Range   Creatinine, Urine 134.00 mg/dL   Total Protein, Urine 11 mg/dL   Protein Creatinine Ratio 0.08 0.00 - 0.15 mg/mg[Cre]  Comprehensive metabolic panel   Collection Time: 05/13/15  3:51 PM  Result Value Ref Range   Sodium 137 135 - 145 mmol/L   Potassium 3.7 3.5 - 5.1 mmol/L   Chloride 108 101 - 111 mmol/L   CO2 22 22 - 32 mmol/L   Glucose, Bld 103 (H) 65 - 99 mg/dL   BUN 10 6 - 20 mg/dL   Creatinine, Ser 1.61 0.44 - 1.00 mg/dL   Calcium 9.1 8.9 - 09.6 mg/dL   Total Protein 6.7 6.5 - 8.1 g/dL   Albumin 3.2 (L) 3.5 - 5.0 g/dL   AST 22 15 - 41 U/L   ALT 14 14 - 54 U/L   Alkaline Phosphatase 181 (H) 38 - 126 U/L   Total Bilirubin 0.2 (L) 0.3 - 1.2 mg/dL   GFR calc non Af Amer >60 >60 mL/min   GFR calc Af Amer >60 >60 mL/min   Anion gap 7 5 - 15  CBC   Collection Time: 05/13/15  3:51 PM  Result Value Ref Range   WBC 10.4 4.0 - 10.5 K/uL   RBC 3.64 (L) 3.87 - 5.11 MIL/uL   Hemoglobin 10.4 (L) 12.0 - 15.0 g/dL   HCT 04.5 (L) 40.9 - 81.1 %   MCV 87.1 78.0 - 100.0 fL   MCH 28.6 26.0 - 34.0 pg   MCHC 32.8 30.0 - 36.0 g/dL   RDW 91.4 78.2 - 95.6 %   Platelets 196 150 - 400 K/uL    Patient Active  Problem List   Diagnosis Date Noted  . Irregular periods/menstrual cycles 12/18/2011  . Hx of pyelonephritis during pregnancy 09/18/2011  . ANEMIA 05/03/2010  . TOBACCO ABUSE 05/03/2010  . ALLERGIC RHINITIS 05/03/2010  . ASTHMA, CHILDHOOD 05/03/2010  . VAGINAL DISCHARGE 05/03/2010  .  HEADACHE 05/03/2010    Assessment: Theresa Lawson is a 25 y.o. G3P2002 at [redacted]w[redacted]d here for IOL for newly diagnosed GHTN. Pre-e labs wnl.   #GHTN: monitor blood pressure; currently not candidate for Magnesium  #Labor: IOL with likely Cytotec (cervix 1.5/50/-2 on 2/7); then likely FB > Pit  #Pain: Patient desires epidural #FWB: Cat 1 #ID: GBS neg #MOF: bottle #MOC: IUD #Circ: outpatient   Palma Holter, MD PGY 1 Family Medicine   Seen and examined by me also Agree with note Aviva Signs, CNM

## 2015-05-13 NOTE — MAU Provider Note (Signed)
History     CSN: [id number]  Arrival date and time: 05/13/15 1512   First Provider Initiated Contact with Patient 05/13/15 1540      Chief Complaint  Patient presents with  . Hypertension   HPI Comments: [name] is a 25 yo AA G3P2002 female GA [redacted]w[redacted]d with no significant PMH that presents today for referral from the [facility] for elevated blood pressure. Patient complains of self resolving intermittent HA  that started around 36 weeks. She states that they are episodic pulsating like pain. Nothing makes better or worse. Denies vision changes/disturbances, RUQ pain, CP, SOB and peripheral edema. Patient has not history of headaches or migraines. Patient has had no prior complication with pregnancy. Prior pregnancies were SVD at term w/o complications.  At [facility] BP was 162/104. Patient reported this was obtained approx 1300.   Denies any tobacco, alcohol, or illicit drug use during pregnancies.  Denies LOF, VB. Has irregular contractions. Endorses fetal movement.   Hypertension Associated symptoms include headaches. Pertinent negatives include no blurred vision, chest pain or shortness of breath.    Past Medical History  Diagnosis Date  . Seasonal allergies   . Asthma     childhood  . Pyelonephritis     DURING AND BEFORE PREG;WAS HOSPITALIZED PREVIOUSLY  . Infection     YEAST INF;NOT FREQ  . Headache(784.0)     SINCE CURRENT PREGNANCY  . Polyhydramnios 2010    WAS GETTING REG NST'S DURING PREG  . Pregnancy induced hypertension 2010    NO MEDS;HAD INCREASED STESS  . Irregular periods/menstrual cycles 12/18/2011  . Unsure LMP 12/18/2011    EDC by [redacted]w[redacted]d u/s at [facility]    Past Surgical History  Procedure Laterality Date  . Vaginal delivery    . No past surgeries      Family History  Problem Relation Age of Onset  . Asthma Sister   . Asthma Son   . Asthma Brother     X 2  . Nephrolithiasis Mother     HAS HAD 2-3 SURGERIES TO REMOVE     Social History  Substance Use Topics  . Smoking status: Former Smoker    Types: Cigarettes    Quit date: 05/20/2011  . Smokeless tobacco: Never Used  . Alcohol Use: No    Allergies: No Known Allergies  Prescriptions prior to admission  Medication Sig Dispense Refill Last Dose  . cyclobenzaprine (FLEXERIL) 5 MG tablet Take 1 tablet (5 mg total) by mouth 3 (three) times daily as needed for muscle spasms. 30 tablet 0 05/12/2015 at Unknown time  . Prenatal Vit-Fe Fumarate-FA (PRENATAL MULTIVITAMIN) TABS tablet Take 1 tablet by mouth daily at 12 noon.   05/12/2015 at Unknown time    Review of Systems  Constitutional: Negative for fever and chills.  Eyes: Negative for blurred vision, double vision and photophobia.  Respiratory: Negative for shortness of breath.   Cardiovascular: Negative for chest pain.  Gastrointestinal: Negative for nausea, vomiting and abdominal pain.  Neurological: Positive for headaches.   Physical Exam   Blood pressure 133/64, pulse 134, temperature 98.4 F (36.9 C), temperature source Oral, resp. rate 16, last menstrual period 08/12/2014.  Physical Exam  Constitutional: She is oriented to person, place, and time. She appears well-developed and well-nourished. No distress.  Cardiovascular: Normal rate, regular rhythm, normal heart sounds and intact distal pulses.   Respiratory: Effort normal and breath sounds normal.  GI: Soft. Bowel sounds are normal. There is no tenderness. There  is no rebound.  Neurological: She is alert and oriented to person, place, and time. She has normal reflexes.  Negative clonus bilaterally   Skin: Skin is warm and dry.    MAU Course  Procedures  MDM BP on arrival to MAU was 151/96. Patient is aysymptomatic at this time. R/o pre-eclampsia CBC wnl CMP wnl for LFTs Urine protein/Cr ratio: wnl  Assessment and Plan  BP elevated over 4 hours.  Will admit for IOL for GHTN  Palma Holter 05/13/2015, 5:32 PM   Seen and  examined by me also Agree with note Discussed admission for IOL Will not need MgSO4 at this time.  Aviva Signs, CNM

## 2015-05-14 ENCOUNTER — Encounter (HOSPITAL_COMMUNITY): Payer: Self-pay

## 2015-05-14 ENCOUNTER — Inpatient Hospital Stay (HOSPITAL_COMMUNITY): Payer: Medicaid Other | Admitting: Anesthesiology

## 2015-05-14 DIAGNOSIS — Z87891 Personal history of nicotine dependence: Secondary | ICD-10-CM

## 2015-05-14 DIAGNOSIS — O133 Gestational [pregnancy-induced] hypertension without significant proteinuria, third trimester: Secondary | ICD-10-CM | POA: Diagnosis present

## 2015-05-14 DIAGNOSIS — O134 Gestational [pregnancy-induced] hypertension without significant proteinuria, complicating childbirth: Secondary | ICD-10-CM

## 2015-05-14 DIAGNOSIS — Z3A39 39 weeks gestation of pregnancy: Secondary | ICD-10-CM

## 2015-05-14 LAB — CBC
HEMATOCRIT: 29.7 % — AB (ref 36.0–46.0)
Hemoglobin: 9.7 g/dL — ABNORMAL LOW (ref 12.0–15.0)
MCH: 28.3 pg (ref 26.0–34.0)
MCHC: 32.7 g/dL (ref 30.0–36.0)
MCV: 86.6 fL (ref 78.0–100.0)
Platelets: 186 10*3/uL (ref 150–400)
RBC: 3.43 MIL/uL — AB (ref 3.87–5.11)
RDW: 14 % (ref 11.5–15.5)
WBC: 11.6 10*3/uL — AB (ref 4.0–10.5)

## 2015-05-14 LAB — ABO/RH: ABO/RH(D): O POS

## 2015-05-14 LAB — RPR: RPR Ser Ql: NONREACTIVE

## 2015-05-14 MED ORDER — ONDANSETRON HCL 4 MG/2ML IJ SOLN: 4.0000 mg | INTRAMUSCULAR | Status: DC | PRN

## 2015-05-14 MED ORDER — ACETAMINOPHEN 325 MG PO TABS: 650.0000 mg | ORAL_TABLET | ORAL | Status: DC | PRN

## 2015-05-14 MED ORDER — BENZOCAINE-MENTHOL 20-0.5 % EX AERO: 1.0000 "application " | INHALATION_SPRAY | CUTANEOUS | Status: DC | PRN

## 2015-05-14 MED ORDER — FENTANYL 2.5 MCG/ML BUPIVACAINE 1/10 % EPIDURAL INFUSION (WH - ANES)
14.0000 mL/h | INTRAMUSCULAR | Status: DC | PRN
  Administered 2015-05-14: 14 mL/h via EPIDURAL
  Filled 2015-05-14: qty 125

## 2015-05-14 MED ORDER — DIPHENHYDRAMINE HCL 50 MG/ML IJ SOLN
12.5000 mg | INTRAMUSCULAR | Status: DC | PRN
Start: 2015-05-14 — End: 2015-05-14

## 2015-05-14 MED ORDER — OXYTOCIN BOLUS FROM INFUSION: 500.0000 mL | INTRAVENOUS | Status: DC

## 2015-05-14 MED ORDER — LIDOCAINE HCL (PF) 1 % IJ SOLN
30.0000 mL | INTRAMUSCULAR | Status: DC | PRN
Start: 2015-05-14 — End: 2015-05-14
  Filled 2015-05-14: qty 30

## 2015-05-14 MED ORDER — OXYCODONE-ACETAMINOPHEN 5-325 MG PO TABS: 2.0000 | ORAL_TABLET | ORAL | Status: DC | PRN

## 2015-05-14 MED ORDER — PHENYLEPHRINE 40 MCG/ML (10ML) SYRINGE FOR IV PUSH (FOR BLOOD PRESSURE SUPPORT)
80.0000 ug | PREFILLED_SYRINGE | INTRAVENOUS | Status: DC | PRN
  Filled 2015-05-14: qty 20
  Filled 2015-05-14: qty 2

## 2015-05-14 MED ORDER — ONDANSETRON HCL 4 MG/2ML IJ SOLN: 4.0000 mg | Freq: Four times a day (QID) | INTRAMUSCULAR | Status: DC | PRN

## 2015-05-14 MED ORDER — DIPHENHYDRAMINE HCL 25 MG PO CAPS: 25.0000 mg | ORAL_CAPSULE | Freq: Four times a day (QID) | ORAL | Status: DC | PRN

## 2015-05-14 MED ORDER — WITCH HAZEL-GLYCERIN EX PADS: 1.0000 "application " | MEDICATED_PAD | CUTANEOUS | Status: DC | PRN

## 2015-05-14 MED ORDER — DIBUCAINE 1 % RE OINT: 1.0000 "application " | TOPICAL_OINTMENT | RECTAL | Status: DC | PRN

## 2015-05-14 MED ORDER — OXYCODONE-ACETAMINOPHEN 5-325 MG PO TABS
1.0000 | ORAL_TABLET | Freq: Four times a day (QID) | ORAL | Status: DC | PRN
Start: 2015-05-14 — End: 2015-05-15
  Administered 2015-05-14 – 2015-05-15 (×2): 1 via ORAL
  Filled 2015-05-14 (×2): qty 1

## 2015-05-14 MED ORDER — OXYTOCIN 10 UNIT/ML IJ SOLN
2.5000 [IU]/h | INTRAVENOUS | Status: DC
  Administered 2015-05-14: 2.5 [IU]/h via INTRAVENOUS

## 2015-05-14 MED ORDER — ACETAMINOPHEN 325 MG PO TABS
650.0000 mg | ORAL_TABLET | ORAL | Status: DC | PRN
  Administered 2015-05-14: 650 mg via ORAL
  Filled 2015-05-14: qty 2

## 2015-05-14 MED ORDER — LACTATED RINGERS IV SOLN
500.0000 mL | INTRAVENOUS | Status: DC | PRN
  Administered 2015-05-14: 500 mL via INTRAVENOUS

## 2015-05-14 MED ORDER — LIDOCAINE HCL (PF) 1 % IJ SOLN
INTRAMUSCULAR | Status: DC | PRN
  Administered 2015-05-14: 5 mL via EPIDURAL
  Administered 2015-05-14: 3 mL via EPIDURAL
  Administered 2015-05-14: 2 mL via EPIDURAL

## 2015-05-14 MED ORDER — OXYCODONE-ACETAMINOPHEN 5-325 MG PO TABS: 1.0000 | ORAL_TABLET | ORAL | Status: DC | PRN

## 2015-05-14 MED ORDER — SIMETHICONE 80 MG PO CHEW: 80.0000 mg | CHEWABLE_TABLET | ORAL | Status: DC | PRN

## 2015-05-14 MED ORDER — OXYTOCIN 10 UNIT/ML IJ SOLN
1.0000 m[IU]/min | INTRAVENOUS | Status: DC
  Administered 2015-05-14: 2 m[IU]/min via INTRAVENOUS
  Filled 2015-05-14: qty 4

## 2015-05-14 MED ORDER — EPHEDRINE 5 MG/ML INJ
10.0000 mg | INTRAVENOUS | Status: DC | PRN
  Filled 2015-05-14: qty 2

## 2015-05-14 MED ORDER — TETANUS-DIPHTH-ACELL PERTUSSIS 5-2.5-18.5 LF-MCG/0.5 IM SUSP: 0.5000 mL | Freq: Once | INTRAMUSCULAR | Status: DC

## 2015-05-14 MED ORDER — PRENATAL MULTIVITAMIN CH
1.0000 | ORAL_TABLET | Freq: Every day | ORAL | Status: DC
  Administered 2015-05-15: 1 via ORAL
  Filled 2015-05-14: qty 1

## 2015-05-14 MED ORDER — ZOLPIDEM TARTRATE 5 MG PO TABS: 5.0000 mg | ORAL_TABLET | Freq: Every evening | ORAL | Status: DC | PRN

## 2015-05-14 MED ORDER — CITRIC ACID-SODIUM CITRATE 334-500 MG/5ML PO SOLN: 30.0000 mL | ORAL | Status: DC | PRN

## 2015-05-14 MED ORDER — SENNOSIDES-DOCUSATE SODIUM 8.6-50 MG PO TABS
2.0000 | ORAL_TABLET | ORAL | Status: DC
  Administered 2015-05-15: 2 via ORAL
  Filled 2015-05-14: qty 2

## 2015-05-14 MED ORDER — LANOLIN HYDROUS EX OINT: TOPICAL_OINTMENT | CUTANEOUS | Status: DC | PRN

## 2015-05-14 MED ORDER — ONDANSETRON HCL 4 MG PO TABS: 4.0000 mg | ORAL_TABLET | ORAL | Status: DC | PRN

## 2015-05-14 MED ORDER — IBUPROFEN 600 MG PO TABS
600.0000 mg | ORAL_TABLET | Freq: Four times a day (QID) | ORAL | Status: DC
  Administered 2015-05-14 – 2015-05-15 (×4): 600 mg via ORAL
  Filled 2015-05-14 (×4): qty 1

## 2015-05-14 MED ORDER — FLEET ENEMA 7-19 GM/118ML RE ENEM: 1.0000 | ENEMA | RECTAL | Status: DC | PRN

## 2015-05-14 NOTE — Anesthesia Preprocedure Evaluation (Addendum)
Anesthesia Evaluation  Patient identified by MRN, date of birth, ID band Patient awake    Reviewed: Allergy & Precautions, NPO status , Patient's Chart, lab work & pertinent test results  Airway Mallampati: III  TM Distance: >3 FB Neck ROM: Full    Dental  (+) Teeth Intact, Dental Advisory Given   Pulmonary asthma , former smoker,    Pulmonary exam normal breath sounds clear to auscultation       Cardiovascular hypertension (PIH), Normal cardiovascular exam Rhythm:Regular Rate:Normal     Neuro/Psych  Headaches, negative psych ROS   GI/Hepatic negative GI ROS, Neg liver ROS,   Endo/Other  negative endocrine ROS  Renal/GU negative Renal ROS     Musculoskeletal negative musculoskeletal ROS (+)   Abdominal   Peds  Hematology  (+) Blood dyscrasia, anemia , Plt 186k    Anesthesia Other Findings Day of surgery medications reviewed with the patient.  Reproductive/Obstetrics (+) Pregnancy                           Anesthesia Physical Anesthesia Plan  ASA: III  Anesthesia Plan: Epidural   Post-op Pain Management:    Induction:   Airway Management Planned:   Additional Equipment:   Intra-op Plan:   Post-operative Plan:   Informed Consent: I have reviewed the patients History and Physical, chart, labs and discussed the procedure including the risks, benefits and alternatives for the proposed anesthesia with the patient or authorized representative who has indicated his/her understanding and acceptance.   Dental advisory given  Plan Discussed with:   Anesthesia Plan Comments: (Patient identified. Risks/Benefits/Options discussed with patient including but not limited to bleeding, infection, nerve damage, paralysis, failed block, incomplete pain control, headache, blood pressure changes, nausea, vomiting, reactions to medication both or allergic, itching and postpartum back pain. Confirmed  with bedside nurse the patient's most recent platelet count. Confirmed with patient that they are not currently taking any anticoagulation, have any bleeding history or any family history of bleeding disorders. Patient expressed understanding and wished to proceed. All questions were answered. )       Anesthesia Quick Evaluation

## 2015-05-14 NOTE — Anesthesia Postprocedure Evaluation (Signed)
Anesthesia Post Note  Patient: Theresa Lawson  Procedure(s) Performed: * No procedures listed *  Patient location during evaluation: Mother Baby Anesthesia Type: Epidural Level of consciousness: awake and alert and oriented Pain management: pain level not controlled (Pain level 5. RN getting pain medication for patient) Vital Signs Assessment: post-procedure vital signs reviewed and stable Respiratory status: spontaneous breathing, nonlabored ventilation and respiratory function stable Cardiovascular status: stable Postop Assessment: no headache, no backache, patient able to bend at knees, no signs of nausea or vomiting and adequate PO intake Anesthetic complications: no    Last Vitals:  Filed Vitals:   05/14/15 1514 05/14/15 1625  BP: 115/69 119/61  Pulse: 87 65  Temp: 37 C 37.1 C  Resp: 20 18    Last Pain:  Filed Vitals:   05/14/15 1853  PainSc: 6                  Mahesh Sizemore     Pain score 5. RN getting ready to bring pain medication.

## 2015-05-14 NOTE — Anesthesia Procedure Notes (Signed)
Epidural Patient location during procedure: OB  Staffing Anesthesiologist: Tarius Stangelo EDWARD Performed by: anesthesiologist   Preanesthetic Checklist Completed: patient identified, pre-op evaluation, timeout performed, IV checked, risks and benefits discussed and monitors and equipment checked  Epidural Patient position: sitting Prep: DuraPrep Patient monitoring: blood pressure and continuous pulse ox Approach: midline Location: L3-L4 Injection technique: LOR air  Needle:  Needle type: Tuohy  Needle gauge: 17 G Needle length: 9 cm Needle insertion depth: 6 cm Catheter size: 19 Gauge Catheter at skin depth: 11 cm Test dose: negative and Other (1% Lidocaine)  Additional Notes Patient identified.  Risk benefits discussed including failed block, incomplete pain control, headache, nerve damage, paralysis, blood pressure changes, nausea, vomiting, reactions to medication both toxic or allergic, and postpartum back pain.  Patient expressed understanding and wished to proceed.  All questions were answered.  Sterile technique used throughout procedure and epidural site dressed with sterile barrier dressing. No paresthesia or other complications noted. The patient did not experience any signs of intravascular injection such as tinnitus or metallic taste in mouth nor signs of intrathecal spread such as rapid motor block. Please see nursing notes for vital signs. Reason for block:procedure for pain   

## 2015-05-14 NOTE — Progress Notes (Signed)
Zayneb Baucum is a 25 y.o. [id number] at [redacted]w[redacted]d admitted for induction of labor due to Hypertension.  Subjective: Mostly comfortable; had cytotec x 2 w/ the most recent @ 0430  Objective: BP 133/83 mmHg  Pulse 88  Temp(Src) 98 F (36.7 C) (Oral)  Resp 20  Ht  (1.6 m)  Wt 82.101 kg (181 lb)  BMI 32.07 kg/m2  SpO2 100%  LMP 08/12/2014      FHT:  FHR: 130s bpm, variability: moderate,  accelerations:  Present,  decelerations:  Absent UC:   regular, every 1-2 minutes SVE:   Dilation: 3 Effacement (%): 50 Station: -3 Exam by:: C.Okoroji RN-BSN/L.Poore RN  Labs: Lab Results  Component Value Date   WBC 10.4 05/13/2015   HGB 10.4* 05/13/2015   HCT 31.7* 05/13/2015   MCV 87.1 05/13/2015   PLT 196 05/13/2015    Assessment / Plan: IUP@39 .2wks gHTN- BPs stable  Plan on Pitocin @ 0830 unless in spontaneous labor  Raegan Sipp CNM 05/14/2015, 5:43 AM

## 2015-05-14 NOTE — Progress Notes (Signed)
Labor Progress Note  Theresa Lawson is a 25 y.o. G3P2002 at [redacted]w[redacted]d  admitted for induction of labor due to [facility]. BP within normal limits now. Pre-e labs wnl.  S:  Doing well no concerns or questions.    O:  BP 123/77 mmHg  Pulse 79  Temp(Src) 98 F (36.7 C) (Oral)  Resp 15  Ht  (1.6 m)  Wt 82.101 kg (181 lb)  BMI 32.07 kg/m2  SpO2 99%  LMP 08/12/2014     FHT:  FHR: 130-135 bpm, variability: moderate,  accelerations:  Present,  decelerations:  Absent UC:   2-4 mins SVE:   Dilation: 4.5 Effacement (%): 40 Station: -2 Exam by:: Craige Cotta, RN Intact  Pitocin   Labs: Lab Results  Component Value Date   WBC 11.6* 05/14/2015   HGB 9.7* 05/14/2015   HCT 29.7* 05/14/2015   MCV 86.6 05/14/2015   PLT 186 05/14/2015    Assessment / Plan: 25 y.o. G3P2002 [redacted]w[redacted]d IOL for GHTN. BP within normal limits now. Progressing on Pit    Labor: Progressing on Pitocin, will continue to increase then AROM Fetal Wellbeing:  Category I Pain Control:  Epidural Anticipated MOD:  NSVD  Palma Holter, MD PGY 1 Family Medicine

## 2015-05-15 MED ORDER — IBUPROFEN 600 MG PO TABS: 600.0000 mg | ORAL_TABLET | Freq: Four times a day (QID) | ORAL | Status: DC

## 2015-05-15 NOTE — Lactation Note (Signed)
This note was copied from a baby's chart. Lactation Consultation Note  Mother states she wants to give baby formula and breastfeed. Discussed supply and demand.  Pacifier use not recommended at this time.  Reviewed engorgement care and monitoring voids/stools. Mom made aware of O/P services, breastfeeding support groups, community resources, and our phone # for post-discharge questions.    Patient Name: Theresa Lawson RUEAV'W Date: 05/15/2015 Reason for consult: Initial assessment   Maternal Data    Feeding Feeding Type: Formula Nipple Type: Slow - flow  LATCH Score/Interventions                      Lactation Tools Discussed/Used     Consult Status Consult Status: Complete    Hardie Pulley 05/15/2015, 1:52 PM

## 2015-05-15 NOTE — Progress Notes (Signed)
Post Partum Day 1  Subjective:  Theresa Lawson is a 25 y.o. W0J8119 [redacted]w[redacted]d s/p SVD (2/9 ).  No acute events overnight.  Pt denies problems with ambulating, voiding or po intake.  She denies nausea or vomiting.  Right leg pain this AM that she describes as sharp and tingling down anterior portion of leg. No unilateral leg swelling, or calf tenderness.Pain well controlled by current pain regimen.  She has had flatus. She has had bowel movement.  Lochia Small that is improving.  Plan for birth control is IUD.  Method of Feeding: bottle and breast  Objective: BP 121/81 mmHg  Pulse 66  Temp(Src) 98.1 F (36.7 C) (Oral)  Resp 16  Ht  (1.6 m)  Wt 82.101 kg (181 lb)  BMI 32.07 kg/m2  SpO2 100%  LMP 08/12/2014  Breastfeeding? Unknown  Physical Exam:  General: alert, cooperative and no distress Chest: CTAB Heart: RRR no m/r/g Abdomen: +BS, soft, nontender, fundus firm at/below umbilicus Uterine Fundus: firm DVT Evaluation: No evidence of DVT seen on physical exam. Extremities: trace edema   Recent Labs  05/13/15 1551 05/14/15 0808  HGB 10.4* 9.7*  HCT 31.7* 29.7*    Assessment/Plan:  ASSESSMENT: Theresa Lawson is a 25 y.o. G3P3003 [redacted]w[redacted]d ppd #1 s/p NSVD doing well.  1. PPD #1- Patient is doing well this morning without any complaints. Vitals signs stable. Encouraged patient to ambulate and continue PO fluids. Pain is controlled on current regimen. Outpatient circumcision. Patient feels that she is ready to go home this afternoon. 2. GHTN- IOL due to newly dx GHTN. Blood pressures since delivery have been stable 119-129 systolic and 61-88 diastolic.  3. Contraception- IUD  Discharge home  Order for [facility] nurse to come out this week and check BP   LOS: 2 days   Demetrios Loll 05/15/2015, 7:26 AM   I have seen and examined this patient and agree the above assessment.  Respiratory effort normal, lochia appropriate, legs negative,  pain level  normal.  CRESENZO-DISHMAN,Sharisa Toves 05/15/2015 8:09 AM

## 2015-05-15 NOTE — Discharge Summary (Signed)
OB Discharge Summary     Patient Name: Theresa Lawson DOB: June 15, 1990 MRN: [id number]  Date of admission: 05/13/2015 Delivering MD: Illene Bolus A   Date of discharge: 05/15/2015  Admitting diagnosis: 39 WKS, ELEV BP Intrauterine pregnancy: [redacted]w[redacted]d     Secondary diagnosis:  Active Problems:   Gestational hypertension w/o significant proteinuria in 3rd trimester   Gestational hypertension without significant proteinuria in third trimester  Additional problems: none     Discharge diagnosis: Term Pregnancy Delivered                                                                                                Post partum procedures:none  Augmentation: none  Complications: None  Hospital course:  Onset of Labor With Vaginal Delivery     25 y.o. yo G3P3003 at [redacted]w[redacted]d was admitted in Active Labor on 05/13/2015. Patient had an uncomplicated labor course as follows:  Membrane Rupture Time/Date: 12:41 PM ,05/14/2015   Intrapartum Procedures: Episiotomy: None [1]                                         Lacerations:  Labial [10]  Patient had a delivery of a Viable infant. 05/14/2015  Information for the patient's newborn:  Nalleli, Largent [[id number]]  Delivery Method: Vaginal, Spontaneous Delivery (Filed from Delivery Summary)    Pateint had an uncomplicated postpartum course.  She is ambulating, tolerating a regular diet, passing flatus, and urinating well. Patient is discharged home in stable condition on 05/15/2015.    Physical exam  Filed Vitals:   05/14/15 1514 05/14/15 1625 05/14/15 2011 05/15/15 0634  BP: 115/69 119/61 129/88 121/81  Pulse: 87 65 53 66  Temp: 98.6 F (37 C) 98.8 F (37.1 C) 98.3 F (36.8 C) 98.1 F (36.7 C)  TempSrc: Oral Oral Oral Oral  Resp: Height:      Weight:      SpO2: 100% 100% 100%    General: alert, cooperative and no distress Lochia: appropriate Uterine Fundus: firm Incision: N/A DVT Evaluation: No evidence of DVT seen on physical  exam. Labs: Lab Results  Component Value Date   WBC 11.6* 05/14/2015   HGB 9.7* 05/14/2015   HCT 29.7* 05/14/2015   MCV 86.6 05/14/2015   PLT 186 05/14/2015   CMP Latest Ref Rng 05/13/2015  Glucose 65 - 99 mg/dL 536(U)  BUN 6 - 20 mg/dL 10  Creatinine 4.40 - 3.47 mg/dL 4.25  Sodium 956 - 387 mmol/L 137  Potassium 3.5 - 5.1 mmol/L 3.7  Chloride 101 - 111 mmol/L 108  CO2 22 - 32 mmol/L 22  Calcium 8.9 - 10.3 mg/dL 9.1  Total Protein 6.5 - 8.1 g/dL 6.7  Total Bilirubin 0.3 - 1.2 mg/dL 5.6(E)  Alkaline Phos 38 - 126 U/L 181(H)  AST 15 - 41 U/L 22  ALT 14 - 54 U/L 14    Discharge instruction: per After Visit Summary and "Baby and Me Booklet".  After visit meds:  Medication List    STOP taking these medications        cyclobenzaprine 5 MG tablet  Commonly known as:  FLEXERIL      TAKE these medications        ibuprofen 600 MG tablet  Commonly known as:  ADVIL,MOTRIN  Take 1 tablet (600 mg total) by mouth every 6 (six) hours.     prenatal multivitamin Tabs tablet  Take 1 tablet by mouth daily at 12 noon.        Diet: routine diet  Activity: Advance as tolerated. Pelvic rest for 6 weeks.   Outpatient follow up:6 weeks Follow up Appt:No future appointments. Follow up Visit:No Follow-up on file.  Postpartum contraception: IUD Mirena  Newborn Data: Live born female  Birth Weight: 7 lb 10.6 oz (3475 g) APGAR: 8, 9  Baby Feeding: Bottle Disposition:home with mother   05/15/2015 Greig Right, CNM

## 2015-05-15 NOTE — Discharge Instructions (Signed)

## 2016-01-12 ENCOUNTER — Ambulatory Visit (HOSPITAL_COMMUNITY)
Admission: EM | Admit: 2016-01-12 | Discharge: 2016-01-12 | Disposition: A | Discharge status: Home / Self Care | Payer: Medicaid Other | Attending: Physician Assistant | Admitting: Physician Assistant

## 2016-01-12 ENCOUNTER — Encounter (HOSPITAL_COMMUNITY): Payer: Self-pay | Admitting: Emergency Medicine

## 2016-01-12 DIAGNOSIS — R519 Headache, unspecified: Secondary | ICD-10-CM

## 2016-01-12 DIAGNOSIS — K2900 Acute gastritis without bleeding: Secondary | ICD-10-CM

## 2016-01-12 DIAGNOSIS — R51 Headache: Secondary | ICD-10-CM | POA: Diagnosis not present

## 2016-01-12 LAB — POCT URINALYSIS DIP (DEVICE)
Glucose, UA: NEGATIVE mg/dL
Hgb urine dipstick: NEGATIVE
Leukocytes, UA: NEGATIVE
Nitrite: NEGATIVE
PH: 7 (ref 5.0–8.0)
PROTEIN: 30 mg/dL — AB
SPECIFIC GRAVITY, URINE: 1.02 (ref 1.005–1.030)
Urobilinogen, UA: 2 mg/dL — ABNORMAL HIGH (ref 0.0–1.0)

## 2016-01-12 LAB — POCT PREGNANCY, URINE: Preg Test, Ur: NEGATIVE

## 2016-01-12 MED ORDER — DIPHENHYDRAMINE HCL 50 MG/ML IJ SOLN
INTRAMUSCULAR | Status: AC
  Filled 2016-01-12: qty 1

## 2016-01-12 MED ORDER — KETOROLAC TROMETHAMINE 30 MG/ML IJ SOLN
INTRAMUSCULAR | Status: AC
  Filled 2016-01-12: qty 1

## 2016-01-12 MED ORDER — KETOROLAC TROMETHAMINE 30 MG/ML IJ SOLN
30.0000 mg | Freq: Once | INTRAMUSCULAR | Status: AC
  Administered 2016-01-12: 30 mg via INTRAMUSCULAR

## 2016-01-12 MED ORDER — DIPHENHYDRAMINE HCL 50 MG/ML IJ SOLN
25.0000 mg | Freq: Once | INTRAMUSCULAR | Status: AC
  Administered 2016-01-12: 25 mg via INTRAMUSCULAR

## 2016-01-12 MED ORDER — ONDANSETRON HCL 4 MG PO TABS: 4.0000 mg | ORAL_TABLET | Freq: Four times a day (QID) | ORAL | 0 refills | Status: DC

## 2016-01-12 NOTE — ED Provider Notes (Signed)
CSN: [id number]     Arrival date & time 01/12/16  1016 History   First MD Initiated Contact with Patient 01/12/16 1113     No chief complaint on file.  (Consider location/radiation/quality/duration/timing/severity/associated sxs/prior Treatment) HPI THIS IS A NEW PROBLEM Pt presents with 1 week history of headache, nausea, no feeling well. States she does not like to take medication, but symptoms have lasted a long time without resolution. Also some nausea, no vomiting or diarrhea.   Past Medical History:  Diagnosis Date  . Asthma    childhood  . Headache(784.0)    SINCE CURRENT PREGNANCY  . Infection    YEAST INF;NOT FREQ  . Irregular periods/menstrual cycles 12/18/2011  . Polyhydramnios 2010   WAS GETTING REG NST'S DURING PREG  . Pregnancy induced hypertension 2010   NO MEDS;HAD INCREASED STESS  . Pyelonephritis    DURING AND BEFORE PREG;WAS HOSPITALIZED PREVIOUSLY  . Seasonal allergies   . Unsure LMP 12/18/2011   EDC by [redacted]w[redacted]d u/s at [facility]   Past Surgical History:  Procedure Laterality Date  . NO PAST SURGERIES    . VAGINAL DELIVERY     Family History  Problem Relation Age of Onset  . Asthma Sister   . Asthma Son   . Asthma Brother     X 2  . Nephrolithiasis Mother     HAS HAD 2-3 SURGERIES TO REMOVE   Social History  Substance Use Topics  . Smoking status: Former Smoker    Types: Cigarettes    Quit date: 05/20/2011  . Smokeless tobacco: Never Used  . Alcohol use No   OB History    Gravida Para Term Preterm AB Living   3 3 3  0 0 3   SAB TAB Ectopic Multiple Live Births   0 0 0 0 3     Review of Systems  Denies:  ABDOMINAL PAIN, CHEST PAIN, CONGESTION, DYSURIA, SHORTNESS OF BREATH  Allergies  Review of patient's allergies indicates no known allergies.  Home Medications   Prior to Admission medications   Medication Sig Start Date End Date Taking? Authorizing Provider  ibuprofen (ADVIL,MOTRIN) 600 MG tablet Take 1 tablet (600 mg total) by mouth every 6  (six) hours. 05/15/15   Jacklyn Shell, CNM  Prenatal Vit-Fe Fumarate-FA (PRENATAL MULTIVITAMIN) TABS tablet Take 1 tablet by mouth daily at 12 noon.    Historical Provider, MD   Meds Ordered and Administered this Visit  Medications - No data to display  There were no vitals taken for this visit. No data found.   Physical Exam NURSES NOTES AND VITAL SIGNS REVIEWED. CONSTITUTIONAL: Well developed, well nourished, no acute distress HEENT: normocephalic, atraumatic EYES: Conjunctiva normal NECK:normal ROM, supple, no adenopathy PULMONARY:No respiratory distress, normal effort ABDOMINAL: Soft, ND, NT BS+, No CVAT MUSCULOSKELETAL: Normal ROM of all extremities,  SKIN: warm and dry without rash PSYCHIATRIC: Mood and affect, behavior are normal  Urgent Care Course   Clinical Course   Some improvement of headache.  Procedures (including critical care time)  Labs Review Labs Reviewed - No data to display  Imaging Review No results found.   Visual Acuity Review  Right Eye Distance:   Left Eye Distance:   Bilateral Distance:    Right Eye Near:   Left Eye Near:    Bilateral Near:         MDM   1. Acute gastritis without hemorrhage, unspecified gastritis type   2. Nonintractable headache, unspecified chronicity pattern, unspecified headache type  Patient is reassured that there are no issues that require transfer to higher level of care at this time or additional tests. Patient is advised to continue home symptomatic treatment. Patient is advised that if there are new or worsening symptoms to attend the emergency department, contact primary care provider, or return to UC. Instructions of care provided discharged home in stable condition.    THIS NOTE WAS GENERATED USING A VOICE RECOGNITION SOFTWARE PROGRAM. ALL REASONABLE EFFORTS  WERE MADE TO PROOFREAD THIS DOCUMENT FOR ACCURACY.  I have verbally reviewed the discharge instructions with the patient. A  printed AVS was given to the patient.  All questions were answered prior to discharge.      Tharon AquasFrank C Patrick, PA 01/12/16 1209

## 2016-01-12 NOTE — ED Triage Notes (Signed)
Pt is here for abd pain onset 1 week +++ associated w/back pain, HA, and feeling nauseas  Denies fevers, urinary sx  A&O x4... NAD

## 2016-03-12 ENCOUNTER — Ambulatory Visit (HOSPITAL_COMMUNITY)
Admission: EM | Admit: 2016-03-12 | Discharge: 2016-03-12 | Disposition: A | Discharge status: Home / Self Care | Payer: Medicaid Other | Attending: Family Medicine | Admitting: Family Medicine

## 2016-03-12 ENCOUNTER — Encounter (HOSPITAL_COMMUNITY): Payer: Self-pay | Admitting: *Deleted

## 2016-03-12 DIAGNOSIS — N898 Other specified noninflammatory disorders of vagina: Secondary | ICD-10-CM | POA: Insufficient documentation

## 2016-03-12 DIAGNOSIS — B9689 Other specified bacterial agents as the cause of diseases classified elsewhere: Secondary | ICD-10-CM

## 2016-03-12 DIAGNOSIS — N760 Acute vaginitis: Secondary | ICD-10-CM | POA: Insufficient documentation

## 2016-03-12 LAB — POCT URINALYSIS DIP (DEVICE)
Bilirubin Urine: NEGATIVE
Glucose, UA: NEGATIVE mg/dL
HGB URINE DIPSTICK: NEGATIVE
Ketones, ur: NEGATIVE mg/dL
Leukocytes, UA: NEGATIVE
Nitrite: NEGATIVE
PROTEIN: NEGATIVE mg/dL
SPECIFIC GRAVITY, URINE: 1.02 (ref 1.005–1.030)
UROBILINOGEN UA: 0.2 mg/dL (ref 0.0–1.0)
pH: 7.5 (ref 5.0–8.0)

## 2016-03-12 LAB — POCT PREGNANCY, URINE: Preg Test, Ur: NEGATIVE

## 2016-03-12 MED ORDER — METRONIDAZOLE 500 MG PO TABS: 500.0000 mg | ORAL_TABLET | Freq: Two times a day (BID) | ORAL | 0 refills | Status: DC

## 2016-03-12 NOTE — ED Provider Notes (Signed)
CSN: [id number]     Arrival date & time 03/12/16  1510 History   First MD Initiated Contact with Patient 03/12/16 1526     Chief Complaint  Patient presents with  . Abdominal Pain   (Consider location/radiation/quality/duration/timing/severity/associated sxs/prior Treatment)  25 yo with a 1 week history of malodorous discharge, mild abdominal discomfort and just doesn't feel well. No fever or chills. No N, V. No chance pregnant. No urinary symptoms.       Past Medical History:  Diagnosis Date  . Asthma    childhood  . Headache(784.0)    SINCE CURRENT PREGNANCY  . Infection    YEAST INF;NOT FREQ  . Irregular periods/menstrual cycles 12/18/2011  . Polyhydramnios 2010   WAS GETTING REG NST'S DURING PREG  . Pregnancy induced hypertension 2010   NO MEDS;HAD INCREASED STESS  . Pyelonephritis    DURING AND BEFORE PREG;WAS HOSPITALIZED PREVIOUSLY  . Seasonal allergies   . Unsure LMP 12/18/2011   [facility] 1726w5d [facility]   Past Surgical History:  Procedure Laterality Date  . NO PAST SURGERIES    . VAGINAL DELIVERY     Family History  Problem Relation Age of Onset  . Asthma Sister   . Asthma Son   . Asthma Brother     X 2  . Nephrolithiasis Mother     HAS HAD 2-3 SURGERIES TO REMOVE   Social History  Substance Use Topics  . Smoking status: Former Smoker    Types: Cigarettes    Quit date: 05/20/2011  . Smokeless tobacco: Never Used  . Alcohol use No   OB History    Gravida Para Term Preterm AB Living   3 3 3  0 0 3   SAB TAB Ectopic Multiple Live Births   0 0 0 0 3     Review of Systems  Constitutional: Positive for fatigue. Negative for chills and fever.  Respiratory: Negative.   Gastrointestinal: Positive for abdominal pain. Negative for nausea and vomiting.  Genitourinary: Positive for pelvic pain.  Skin: Negative.     Allergies  Patient has no known allergies.  Home Medications   Prior to Admission medications   Medication Sig Start Date End Date  Taking? Authorizing Provider  ibuprofen (ADVIL,MOTRIN) 600 MG tablet Take 1 tablet (600 mg total) by mouth every 6 (six) hours. 05/15/15   Jacklyn ShellFrances Cresenzo-Dishmon, CNM  metroNIDAZOLE (FLAGYL) 500 MG tablet Take 1 tablet (500 mg total) by mouth 2 (two) times daily. 03/12/16   Riki SheerMichelle G Young, PA-C  ondansetron (ZOFRAN) 4 MG tablet Take 1 tablet (4 mg total) by mouth every 6 (six) hours. 01/12/16   Tharon AquasFrank C Patrick, PA  Prenatal Vit-Fe Fumarate-FA (PRENATAL MULTIVITAMIN) TABS tablet Take 1 tablet by mouth daily at 12 noon.    Historical Provider, MD   Meds Ordered and Administered this Visit  Medications - No data to display  BP 123/81 (BP Location: Right Arm)   Pulse 92   Temp 98.5 F (36.9 C) (Oral)   Resp 16   LMP 02/24/2016   SpO2 100%  No data found.   Physical Exam  Constitutional: She is oriented to person, place, and time. She appears well-developed and well-nourished. No distress.  Pulmonary/Chest: Effort normal.  Abdominal: Soft. Bowel sounds are normal. She exhibits no distension and no mass. There is no tenderness. There is no rebound and no guarding.  Genitourinary: Uterus normal. Vaginal discharge found.  Genitourinary Comments: Green to white vaginal discharge in vaginal vault. No  adnexa tenderness. No lesions are noted  Neurological: She is alert and oriented to person, place, and time.  Skin: Skin is warm and dry. She is not diaphoretic.  Psychiatric: Her behavior is normal.  Nursing note and vitals reviewed.   Urgent Care Course   Clinical Course     Procedures (including critical care time)  Labs Review Labs Reviewed  POCT URINALYSIS DIP (DEVICE)  POCT PREGNANCY, URINE  CERVICOVAGINAL ANCILLARY ONLY    Imaging Review No results found.   Visual Acuity Review  Right Eye Distance:   Left Eye Distance:   Bilateral Distance:    Right Eye Near:   Left Eye Near:    Bilateral Near:         MDM   1. Vaginal discharge   2. Bacterial vaginosis     Cover with Flagyl. STD testing. Protective intercourse suggested. F/U prn.     Riki SheerMichelle G Young, PA-C 03/12/16 1628

## 2016-03-12 NOTE — ED Triage Notes (Signed)
Pt   C/o   Weakness     Vaginal  Discharge           Symptoms       X  1   Week      abd  Pain  As   Well

## 2016-03-12 NOTE — Discharge Instructions (Signed)
This is most likely a bacterial vaginitis, but we will test you for STD's as we discussed. This treatment will cover you for BV and trichomonas if results are found to be positive. Otherwise practice safe sex.

## 2016-03-14 LAB — CERVICOVAGINAL ANCILLARY ONLY
CHLAMYDIA, DNA PROBE: POSITIVE — AB
NEISSERIA GONORRHEA: NEGATIVE

## 2016-03-15 ENCOUNTER — Telehealth (HOSPITAL_COMMUNITY): Payer: Self-pay | Admitting: Emergency Medicine

## 2016-03-15 LAB — CERVICOVAGINAL ANCILLARY ONLY: WET PREP (BD AFFIRM): POSITIVE — AB

## 2016-03-15 MED ORDER — AZITHROMYCIN 250 MG PO TABS: 1000.0000 mg | ORAL_TABLET | Freq: Once | ORAL | 0 refills | Status: AC

## 2016-03-15 NOTE — Telephone Encounter (Signed)
Note forwarded to staff to call pt with results.

## 2016-03-16 ENCOUNTER — Telehealth (HOSPITAL_COMMUNITY): Payer: Self-pay | Admitting: Emergency Medicine

## 2016-03-16 NOTE — Telephone Encounter (Signed)
Called pt and notified of recent lab results Pt ID'd properly... Reports feeling better... Pt was upset Notified of pending Rx sent to pharmacy on file.  Adv pt if sx are not getting better to return or to f/u w/PCP Education on safe sex given Also adv pt to notify partner(s) [facility] Pt verb understanding.

## 2016-03-16 NOTE — Telephone Encounter (Signed)
-----   Message from Charm RingsErin J Honig, MD sent at 03/15/2016 10:16 AM EST ----- Please notify patient and health department of positive chlamydia.  Rx for azithromycin sent to pharmacy on file.  No sex for 10 days after treatment. Follow up as needed. EH

## 2016-03-18 ENCOUNTER — Telehealth (HOSPITAL_COMMUNITY): Payer: Self-pay | Admitting: Emergency Medicine

## 2016-03-18 NOTE — Telephone Encounter (Signed)
Returned pt's call... LM on VM [phone] on 12/14  Could not understand message for every other word was cut off due to poor signal/reception.

## 2016-10-05 ENCOUNTER — Ambulatory Visit (HOSPITAL_COMMUNITY)
Admission: EM | Admit: 2016-10-05 | Discharge: 2016-10-05 | Disposition: A | Discharge status: Home / Self Care | Payer: Medicaid Other | Attending: Family Medicine | Admitting: Family Medicine

## 2016-10-05 ENCOUNTER — Encounter (HOSPITAL_COMMUNITY): Payer: Self-pay | Admitting: Emergency Medicine

## 2016-10-05 DIAGNOSIS — Z87891 Personal history of nicotine dependence: Secondary | ICD-10-CM | POA: Insufficient documentation

## 2016-10-05 DIAGNOSIS — Z113 Encounter for screening for infections with a predominantly sexual mode of transmission: Secondary | ICD-10-CM | POA: Diagnosis not present

## 2016-10-05 DIAGNOSIS — N898 Other specified noninflammatory disorders of vagina: Secondary | ICD-10-CM | POA: Insufficient documentation

## 2016-10-05 DIAGNOSIS — R109 Unspecified abdominal pain: Secondary | ICD-10-CM | POA: Diagnosis not present

## 2016-10-05 DIAGNOSIS — J45909 Unspecified asthma, uncomplicated: Secondary | ICD-10-CM | POA: Insufficient documentation

## 2016-10-05 MED ORDER — METRONIDAZOLE 500 MG PO TABS: 500.0000 mg | ORAL_TABLET | Freq: Two times a day (BID) | ORAL | 0 refills | Status: DC

## 2016-10-05 NOTE — ED Provider Notes (Signed)
CSN: [id number]     Arrival date & time 10/05/16  1149 History   First MD Initiated Contact with Patient 10/05/16 1302     Chief Complaint  Patient presents with  . Abdominal Pain  . Vaginal Discharge   (Consider location/radiation/quality/duration/timing/severity/associated sxs/prior Treatment) 26 y.o. Sexually active,  complaints of vaginal discharge x 1 week that is white and thick with irritation and odor. No vaginal itchines but does have some lower abdominal cramping that is intermittent.  She would like STD testing. She denies N/V/D. Denies pelvic pain. No genital rash reported. No urinary symptoms.       Past Medical History:  Diagnosis Date  . Asthma    childhood  . Headache(784.0)    SINCE CURRENT PREGNANCY  . Infection    YEAST INF;NOT FREQ  . Irregular periods/menstrual cycles 12/18/2011  . Polyhydramnios 2010   WAS GETTING REG NST'S DURING PREG  . Pregnancy induced hypertension 2010   NO MEDS;HAD INCREASED STESS  . Pyelonephritis    DURING AND BEFORE PREG;WAS HOSPITALIZED PREVIOUSLY  . Seasonal allergies   . Unsure LMP 12/18/2011   [facility] 2764[facility]d [facility]   Past Surgical History:  Procedure Laterality Date  . NO PAST SURGERIES    . VAGINAL DELIVERY     Family History  Problem Relation Age of Onset  . Asthma Sister   . Asthma Son   . Asthma Brother        X 2  . Nephrolithiasis Mother        HAS HAD 2-3 SURGERIES TO REMOVE   Social History  Substance Use Topics  . Smoking status: Former Smoker    Types: Cigarettes    Quit date: 05/20/2011  . Smokeless tobacco: Never Used  . Alcohol use No   OB History    Gravida Para Term Preterm AB Living   3 3 3  0 0 3   SAB TAB Ectopic Multiple Live Births   0 0 0 0 3     Review of Systems  Constitutional:       See HPI    Allergies  Patient has no known allergies.  Home Medications   Prior to Admission medications   Medication Sig Start Date End Date Taking? Authorizing Provider  metroNIDAZOLE  (FLAGYL) 500 MG tablet Take 1 tablet (500 mg total) by mouth 2 (two) times daily. 10/05/16   Lucia EstelleZheng, Aracelis Ulrey, NP   Meds Ordered and Administered this Visit  Medications - No data to display  BP 116/80 (BP Location: Right Arm)   Pulse 61   Temp 98.4 F (36.9 C) (Oral)   Resp 18   LMP 10/03/2016   SpO2 100%  No data found.   Physical Exam  Constitutional: She is oriented to person, place, and time. She appears well-developed and well-nourished.  Cardiovascular: Normal rate, regular rhythm and normal heart sounds.   Pulmonary/Chest: Effort normal.  Abdominal: Soft. Bowel sounds are normal. There is no tenderness.  Genitourinary:  Genitourinary Comments: Labia majora and minora symmetrical with no lesion. Vaginal canal pink and moist. Moderate amt of thin white/yellowish discharged noted in canal. -CMT, adnexal mass. -uterine tenderness.   Neurological: She is alert and oriented to person, place, and time.  Skin: Skin is warm and dry.  Vitals reviewed.   Urgent Care Course     Procedures (including critical care time)  Labs Review Labs Reviewed  CERVICOVAGINAL ANCILLARY ONLY    Imaging Review No results found.  MDM   1.  Vaginal discharge   2. Vaginal odor   3. Screening for STD (sexually transmitted disease)    1) RX for Flagyl given to treat presumptively for BV 2) Cytology pending, will call patient with result.     Lucia Estelle, NP 10/05/16 1316

## 2016-10-05 NOTE — Discharge Instructions (Signed)
I will treat you presumptively today for bacterial vaginosis.   We will call you when your swab result comes back.

## 2016-10-05 NOTE — ED Triage Notes (Signed)
[facility] a complaint of lower abdominal pain and a vaginal discharge with a strong odor x 1 week.

## 2016-10-06 LAB — CERVICOVAGINAL ANCILLARY ONLY
BACTERIAL VAGINITIS: POSITIVE — AB
CANDIDA VAGINITIS: NEGATIVE
Chlamydia: NEGATIVE
Neisseria Gonorrhea: NEGATIVE
TRICH (WINDOWPATH): NEGATIVE

## 2016-12-08 ENCOUNTER — Encounter (HOSPITAL_COMMUNITY): Payer: Self-pay | Admitting: Emergency Medicine

## 2016-12-08 ENCOUNTER — Emergency Department (HOSPITAL_COMMUNITY)
Admission: EM | Admit: 2016-12-08 | Discharge: 2016-12-08 | Disposition: A | Discharge status: Home / Self Care | Payer: Medicaid Other | Attending: Emergency Medicine | Admitting: Emergency Medicine

## 2016-12-08 ENCOUNTER — Emergency Department (HOSPITAL_COMMUNITY): Payer: Medicaid Other

## 2016-12-08 DIAGNOSIS — W260XXA Contact with knife, initial encounter: Secondary | ICD-10-CM | POA: Diagnosis not present

## 2016-12-08 DIAGNOSIS — S61210A Laceration without foreign body of right index finger without damage to nail, initial encounter: Secondary | ICD-10-CM | POA: Diagnosis not present

## 2016-12-08 DIAGNOSIS — Y929 Unspecified place or not applicable: Secondary | ICD-10-CM | POA: Diagnosis not present

## 2016-12-08 DIAGNOSIS — Z87891 Personal history of nicotine dependence: Secondary | ICD-10-CM | POA: Diagnosis not present

## 2016-12-08 DIAGNOSIS — Z23 Encounter for immunization: Secondary | ICD-10-CM | POA: Insufficient documentation

## 2016-12-08 DIAGNOSIS — J45909 Unspecified asthma, uncomplicated: Secondary | ICD-10-CM | POA: Diagnosis not present

## 2016-12-08 DIAGNOSIS — S41112A Laceration without foreign body of left upper arm, initial encounter: Secondary | ICD-10-CM | POA: Diagnosis not present

## 2016-12-08 DIAGNOSIS — Y998 Other external cause status: Secondary | ICD-10-CM | POA: Diagnosis not present

## 2016-12-08 DIAGNOSIS — Y9389 Activity, other specified: Secondary | ICD-10-CM | POA: Diagnosis not present

## 2016-12-08 MED ORDER — IBUPROFEN 400 MG PO TABS
800.0000 mg | ORAL_TABLET | Freq: Once | ORAL | Status: AC
  Administered 2016-12-08: 800 mg via ORAL
  Filled 2016-12-08: qty 2

## 2016-12-08 MED ORDER — TETANUS-DIPHTH-ACELL PERTUSSIS 5-2.5-18.5 LF-MCG/0.5 IM SUSP
0.5000 mL | Freq: Once | INTRAMUSCULAR | Status: AC
  Administered 2016-12-08: 0.5 mL via INTRAMUSCULAR
  Filled 2016-12-08: qty 0.5

## 2016-12-08 MED ORDER — LIDOCAINE HCL (PF) 1 % IJ SOLN
30.0000 mL | Freq: Once | INTRAMUSCULAR | Status: AC
  Administered 2016-12-08: 30 mL
  Filled 2016-12-08: qty 30
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
## 2016-12-08 NOTE — Discharge Instructions (Signed)

## 2016-12-08 NOTE — ED Notes (Signed)
Patient back in the waiting room

## 2016-12-08 NOTE — ED Notes (Signed)
PA at bedside with suture cart 
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
## 2016-12-08 NOTE — ED Notes (Signed)
Bacitracin applied, wound care done, dressing applied

## 2016-12-15 ENCOUNTER — Encounter (HOSPITAL_COMMUNITY): Payer: Self-pay | Admitting: Family Medicine

## 2016-12-15 ENCOUNTER — Ambulatory Visit (HOSPITAL_COMMUNITY)
Admission: EM | Admit: 2016-12-15 | Discharge: 2016-12-15 | Disposition: A | Discharge status: Home / Self Care | Payer: Medicaid Other | Attending: Family Medicine | Admitting: Family Medicine

## 2016-12-15 DIAGNOSIS — R300 Dysuria: Secondary | ICD-10-CM

## 2016-12-15 DIAGNOSIS — J45909 Unspecified asthma, uncomplicated: Secondary | ICD-10-CM | POA: Insufficient documentation

## 2016-12-15 DIAGNOSIS — Z3202 Encounter for pregnancy test, result negative: Secondary | ICD-10-CM

## 2016-12-15 DIAGNOSIS — Z87891 Personal history of nicotine dependence: Secondary | ICD-10-CM | POA: Diagnosis not present

## 2016-12-15 DIAGNOSIS — N3001 Acute cystitis with hematuria: Secondary | ICD-10-CM | POA: Diagnosis present

## 2016-12-15 DIAGNOSIS — Z4802 Encounter for removal of sutures: Secondary | ICD-10-CM | POA: Diagnosis not present

## 2016-12-15 LAB — POCT URINALYSIS DIP (DEVICE)
Bilirubin Urine: NEGATIVE
Glucose, UA: NEGATIVE mg/dL
Ketones, ur: NEGATIVE mg/dL
Nitrite: NEGATIVE
PH: 5.5 (ref 5.0–8.0)
Protein, ur: NEGATIVE mg/dL
Specific Gravity, Urine: >=1.03 (ref 1.005–1.030)
UROBILINOGEN UA: 0.2 mg/dL (ref 0.0–1.0)

## 2016-12-15 LAB — POCT PREGNANCY, URINE: Preg Test, Ur: NEGATIVE

## 2016-12-15 MED ORDER — SULFAMETHOXAZOLE-TRIMETHOPRIM 800-160 MG PO TABS: 1.0000 | ORAL_TABLET | Freq: Two times a day (BID) | ORAL | 0 refills | Status: DC

## 2016-12-15 NOTE — ED Triage Notes (Signed)
Pt here for stiches removal and UTI symptoms.

## 2016-12-15 NOTE — Discharge Instructions (Signed)
Today you were diagnosed with the following: 1. Acute cystitis with hematuria   2. Encounter for removal of sutures     You have been prescribed prescription medications this visit.   Meds ordered this encounter  Medications   sulfamethoxazole-trimethoprim (BACTRIM DS,SEPTRA DS) 800-160 MG tablet    Sig: Take 1 tablet by mouth 2 (two) times daily.    Dispense:  6 tablet    Refill:  0   We have send a urine culture and will contact you when the results are available.  If you are not improving over the next few days or feel you are worsening please follow up here or the Emergency Department if you are unable to see your regular doctor.

## 2016-12-15 NOTE — ED Provider Notes (Signed)
[facility]    ASSESSMENT & PLAN:  Today you were diagnosed with the following: 1. Acute cystitis with hematuria   2. Encounter for removal of sutures     You have been prescribed prescription medications this visit.   Meds ordered this encounter  Medications  . sulfamethoxazole-trimethoprim (BACTRIM DS,SEPTRA DS) 800-160 MG tablet    Sig: Take 1 tablet by mouth 2 (two) times daily.    Dispense:  6 tablet    Refill:  0   We have send a urine culture and will contact you when the results are available.  If you are not improving over the next few days or feel you are worsening please follow up here or the Emergency Department if you are unable to see your regular doctor.  Sutures removed from LUE and R 2nd finger. No complications. Wounds are healed.  Outlined signs and symptoms indicating need for more acute intervention. Call or return to clinic if these symptoms worsen or fail to improve as anticipated. Patient verbalized understanding. After Visit Summary given.  SUBJECTIVE:  Theresa Lawson is a 26 y.o. female who complains of urinary frequency, urgency and dysuria for the past few days. No flank pain, fever, chills, abnormal vaginal discharge or bleeding. Hematuria: not present.  Normal PO intake. No flank or abdominal pain. No self treatment.  Patient's last menstrual period was 11/25/2016.  OBJECTIVE:  Vitals:   12/15/16 1032  BP: 121/76  Pulse: 96  Resp: 18  Temp: 98.8 F (37.1 C)  SpO2: 100%   Appears well, in no apparent distress. Abdomen is soft without tenderness, guarding, mass, rebound or organomegaly. No CVA tenderness or inguinal adenopathy noted. Healed and sutured wounds on LUE and R 2nd finger.  Labs Reviewed  POCT URINALYSIS DIP (DEVICE) - Abnormal; Notable for the following:       Result Value   Hgb urine dipstick TRACE (*)    Leukocytes, UA SMALL (*)    All other components within normal limits  POCT PREGNANCY, URINE    No Known  Allergies  Past Medical History:  Diagnosis Date  . Asthma    childhood  . Headache(784.0)    SINCE CURRENT PREGNANCY  . Infection    YEAST INF;NOT FREQ  . Irregular periods/menstrual cycles 12/18/2011  . Polyhydramnios 2010   WAS GETTING REG NST'S DURING PREG  . Pregnancy induced hypertension 2010   NO MEDS;HAD INCREASED STESS  . Pyelonephritis    DURING AND BEFORE PREG;WAS HOSPITALIZED PREVIOUSLY  . Seasonal allergies   . Unsure LMP 12/18/2011   [facility] 4036w5d [facility]   Social History   Social History  . Marital status: Single    Spouse name: N/A  . Number of children: 1  . Years of education: 10411   Occupational History  . 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 Smoking status: Former Smoker    Types: Cigarettes    Quit date: 05/20/2011  . Smokeless tobacco: Never Used  . Alcohol use No  . Drug use: No  . Sexual activity: Yes    Partners: Male    Birth control/ protection: None     Comment: no unprotected intercourse in past 11 days   Other Topics Concern  . Not on file   Social History Narrative  . No narrative on file   Family History  Problem Relation Age of Onset  . Asthma Sister   . Asthma Son   .  Asthma Brother        X 2  . Nephrolithiasis Mother        HAS HAD 2-3 SURGERIES TO REMOVE       Mardella Layman, MD 12/15/16 1050

## 2016-12-17 LAB — URINE CULTURE

## 2017-03-20 ENCOUNTER — Other Ambulatory Visit: Payer: Self-pay

## 2017-03-20 ENCOUNTER — Encounter (HOSPITAL_COMMUNITY): Payer: Self-pay | Admitting: Emergency Medicine

## 2017-03-20 ENCOUNTER — Emergency Department (HOSPITAL_COMMUNITY)
Admission: EM | Admit: 2017-03-20 | Discharge: 2017-03-20 | Disposition: A | Discharge status: Home / Self Care | Payer: Medicaid Other | Attending: Emergency Medicine | Admitting: Emergency Medicine

## 2017-03-20 DIAGNOSIS — J45909 Unspecified asthma, uncomplicated: Secondary | ICD-10-CM | POA: Insufficient documentation

## 2017-03-20 DIAGNOSIS — B349 Viral infection, unspecified: Secondary | ICD-10-CM | POA: Insufficient documentation

## 2017-03-20 DIAGNOSIS — R05 Cough: Secondary | ICD-10-CM | POA: Diagnosis present

## 2017-03-20 DIAGNOSIS — M545 Low back pain, unspecified: Secondary | ICD-10-CM

## 2017-03-20 DIAGNOSIS — Z87891 Personal history of nicotine dependence: Secondary | ICD-10-CM | POA: Diagnosis not present

## 2017-03-20 DIAGNOSIS — Z8632 Personal history of gestational diabetes: Secondary | ICD-10-CM | POA: Insufficient documentation

## 2017-03-20 DIAGNOSIS — O98511 Other viral diseases complicating pregnancy, first trimester: Secondary | ICD-10-CM | POA: Diagnosis not present

## 2017-03-20 DIAGNOSIS — Z3A09 9 weeks gestation of pregnancy: Secondary | ICD-10-CM

## 2017-03-20 LAB — COMPREHENSIVE METABOLIC PANEL
ALBUMIN: 3.9 g/dL (ref 3.5–5.0)
ALK PHOS: 58 U/L (ref 38–126)
ALT: 33 U/L (ref 14–54)
AST: 35 U/L (ref 15–41)
Anion gap: 11 (ref 5–15)
BILIRUBIN TOTAL: 0.7 mg/dL (ref 0.3–1.2)
BUN: 7 mg/dL (ref 6–20)
CALCIUM: 9.3 mg/dL (ref 8.9–10.3)
CO2: 20 mmol/L — ABNORMAL LOW (ref 22–32)
CREATININE: 0.65 mg/dL (ref 0.44–1.00)
Chloride: 103 mmol/L (ref 101–111)
GFR calc Af Amer: >60 mL/min (ref >60)
GLUCOSE: 96 mg/dL (ref 65–99)
Potassium: 3.9 mmol/L (ref 3.5–5.1)
Sodium: 134 mmol/L — ABNORMAL LOW (ref 135–145)
TOTAL PROTEIN: 6.4 g/dL — AB (ref 6.5–8.1)

## 2017-03-20 LAB — CBC
HEMATOCRIT: 35.6 % — AB (ref 36.0–46.0)
Hemoglobin: 12 g/dL (ref 12.0–15.0)
MCH: 28.2 pg (ref 26.0–34.0)
MCHC: 33.7 g/dL (ref 30.0–36.0)
MCV: 83.6 fL (ref 78.0–100.0)
PLATELETS: 159 10*3/uL (ref 150–400)
RBC: 4.26 MIL/uL (ref 3.87–5.11)
RDW: 12.3 % (ref 11.5–15.5)
WBC: 5.4 10*3/uL (ref 4.0–10.5)

## 2017-03-20 LAB — URINALYSIS, ROUTINE W REFLEX MICROSCOPIC
BILIRUBIN URINE: NEGATIVE
Glucose, UA: NEGATIVE mg/dL
Hgb urine dipstick: NEGATIVE
KETONES UR: 80 mg/dL — AB
LEUKOCYTES UA: NEGATIVE
NITRITE: NEGATIVE
PH: 6 (ref 5.0–8.0)
PROTEIN: NEGATIVE mg/dL
Specific Gravity, Urine: 1.021 (ref 1.005–1.030)

## 2017-03-20 LAB — LIPASE, BLOOD: LIPASE: 20 U/L (ref 11–51)

## 2017-03-20 LAB — PREGNANCY, URINE: Preg Test, Ur: POSITIVE — AB

## 2017-03-20 LAB — I-STAT BETA HCG BLOOD, ED (MC, WL, AP ONLY): I-stat hCG, quantitative: >2000 m[IU]/mL — ABNORMAL HIGH (ref <5)

## 2017-03-20 LAB — I-STAT CG4 LACTIC ACID, ED: Lactic Acid, Venous: 0.75 mmol/L (ref 0.5–1.9)

## 2017-03-20 MED ORDER — SODIUM CHLORIDE 0.9 % IV BOLUS (SEPSIS)
2000.0000 mL | Freq: Once | INTRAVENOUS | Status: AC
  Administered 2017-03-20: 2000 mL via INTRAVENOUS

## 2017-03-20 MED ORDER — ACETAMINOPHEN 325 MG PO TABS
650.0000 mg | ORAL_TABLET | Freq: Once | ORAL | Status: AC | PRN
  Administered 2017-03-20: 650 mg via ORAL
  Filled 2017-03-20: qty 2

## 2017-03-20 MED ORDER — IBUPROFEN 400 MG PO TABS: 400.0000 mg | ORAL_TABLET | Freq: Once | ORAL | Status: DC | PRN

## 2017-03-20 NOTE — Discharge Instructions (Signed)
Drink plenty of fluids Take tylenol 650 mg up to every four hours Recheck with obstetrician next week.  Return if worse at any time.

## 2017-03-20 NOTE — ED Notes (Signed)
Patient left at this time with all belongings. 

## 2017-03-20 NOTE — ED Notes (Signed)
Second bag of fluids just started, will discharge pt when fluids finish.

## 2017-03-20 NOTE — ED Triage Notes (Addendum)
Pt states she has been having abd and flank/back pain since yesterday. Endorses some burning with urination. Pt also states her urine is dark. Pt is also [redacted] weeks pregnant

## 2017-03-20 NOTE — ED Provider Notes (Signed)
[facility]   CSN: [id number] Arrival date & time: 03/20/17  1418     History   Chief Complaint Chief Complaint  Patient presents with  . Abdominal Pain  . Flank Pain    HPI Theresa Lawson is a 26 y.o. female.  HPI  Any 26-year-old female G4 P3 LMP 1015 presents today complaining of low back pain and cough that began yesterday.  She reports she has noted some dark urine.  The pain is in the low back area and radiates bilaterally.  Cough is nonproductive and she is not dyspneic.  She has no nasal congestion, known exposures to illness, known fever, or chills.  She reports some dark urine but no other frequency although she has felt that she has some pain with urination intermittently.  He has had a history of UTIs in the past.  He has not had any prenatal care to date.  She states that she just took a pregnancy test 2 weeks ago.  Reports no significant past medical history.  She was given Tylenol in triage and reports that she is feeling significantly better.  She did notice some pain with palpation and certain movement in the low back.  She has not noted any numbness, tingling, or weakness.  She denies any vaginal discharge.  She has not noted any headache but states that her neck has had some pain with movement.  Past Medical History:  Diagnosis Date  . Asthma    childhood  . Headache(784.0)    SINCE CURRENT PREGNANCY  . Infection    YEAST INF;NOT FREQ  . Irregular periods/menstrual cycles 12/18/2011  . Polyhydramnios 2010   WAS GETTING REG NST'S DURING PREG  . Pregnancy induced hypertension 2010   NO MEDS;HAD INCREASED STESS  . Pyelonephritis    DURING AND BEFORE PREG;WAS HOSPITALIZED PREVIOUSLY  . Seasonal allergies   . Unsure LMP 12/18/2011   [facility] [id number] [facility]    Patient Active Problem List   Diagnosis Date Noted  . Gestational hypertension w/o significant proteinuria in 3rd trimester 05/14/2015  . Gestational  hypertension without significant proteinuria in third trimester 05/14/2015  . Irregular periods/menstrual cycles 12/18/2011  . Hx of pyelonephritis during pregnancy 09/18/2011  . ANEMIA 05/03/2010  . TOBACCO ABUSE 05/03/2010  . ALLERGIC RHINITIS 05/03/2010  . ASTHMA, CHILDHOOD 05/03/2010  . VAGINAL DISCHARGE 05/03/2010  . HEADACHE 05/03/2010    Past Surgical History:  Procedure Laterality Date  . NO PAST SURGERIES    . VAGINAL DELIVERY      OB History    Gravida Para Term Preterm AB Living   4 3 3  0 0 3   SAB TAB Ectopic Multiple Live Births   0 0 0 0 3       Home Medications    Prior to Admission medications   Medication Sig Start Date End Date Taking? Authorizing Provider  metroNIDAZOLE (FLAGYL) 500 MG tablet Take 1 tablet (500 mg total) by mouth 2 (two) times daily. Patient not taking: Reported on 03/20/2017 10/05/16   Lucia EstelleZheng, Feng, NP  sulfamethoxazole-trimethoprim (BACTRIM DS,SEPTRA DS) 800-160 MG tablet Take 1 tablet by mouth 2 (two) times daily. Patient not taking: Reported on 03/20/2017 12/15/16   Mardella LaymanHagler, Brian, MD    Family History Family History  Problem Relation Age of Onset  . Asthma Sister   . Asthma Son   . Asthma Brother        X 2  . Nephrolithiasis  Mother        HAS HAD 2-3 SURGERIES TO REMOVE    Social History Social History   Tobacco Use  . Smoking status: Former Smoker    Types: Cigarettes    Last attempt to quit: 05/20/2011    Years since quitting: 5.8  . Smokeless tobacco: Never Used  Substance Use Topics  . Alcohol use: No  . Drug use: Yes    Types: Marijuana     Allergies   Patient has no known allergies.   Review of Systems Review of Systems  All other systems reviewed and are negative.    Physical Exam Updated Vital Signs BP 121/79 (BP Location: Right Arm)   Pulse (!) 120   Temp (!) 100.6 F (38.1 C) (Oral)   Resp 14   Ht 1.626 m (5\' 4" )   Wt 74.8 kg (165 lb)   LMP 01/16/2017   SpO2 100%   BMI 28.32 kg/m    Physical Exam  Constitutional: She is oriented to person, place, and time. She appears well-developed and well-nourished. She does not appear ill.  HENT:  Head: Normocephalic and atraumatic.  Mouth/Throat: Oropharynx is clear and moist.  Eyes: EOM are normal. Pupils are equal, round, and reactive to light.  Cardiovascular: Tachycardia present.  Hr 106 on my exam  Pulmonary/Chest: Effort normal and breath sounds normal.  Abdominal: Soft. Normal appearance and bowel sounds are normal. There is no tenderness. There is no CVA tenderness.  Neurological: She is alert and oriented to person, place, and time.  Skin: Skin is warm and dry. Capillary refill takes less than 2 seconds.  Psychiatric: She has a normal mood and affect.  Nursing note and vitals reviewed.    ED Treatments / Results  Labs (all labs ordered are listed, but only abnormal results are displayed) Labs Reviewed  COMPREHENSIVE METABOLIC PANEL - Abnormal; Notable for the following components:      Result Value   Sodium 134 (*)    CO2 20 (*)    Total Protein 6.4 (*)    All other components within normal limits  CBC - Abnormal; Notable for the following components:   HCT 35.6 (*)    All other components within normal limits  URINALYSIS, ROUTINE W REFLEX MICROSCOPIC - Abnormal; Notable for the following components:   Color, Urine AMBER (*)    APPearance HAZY (*)    Ketones, ur 80 (*)    All other components within normal limits  PREGNANCY, URINE - Abnormal; Notable for the following components:   Preg Test, Ur POSITIVE (*)    All other components within normal limits  LIPASE, BLOOD  I-STAT CG4 LACTIC ACID, ED    EKG  EKG Interpretation None       Radiology No results found.  Procedures Procedures (including critical care time)  Medications Ordered in ED Medications  sodium chloride 0.9 % bolus 2,000 mL (not administered)  acetaminophen (TYLENOL) tablet 650 mg (650 mg Oral Given 03/20/17 1730)      Initial Impression / Assessment and Plan / ED Course  I have reviewed the triage vital signs and the nursing notes.  Pertinent labs & imaging results that were available during my care of the patient were reviewed by me and considered in my medical decision making (see chart for details).    IV fluids infused, heart rate now at 100.  We have discussed return precautions and need for follow-up patient voices understanding.  Final Clinical Impressions(s) / ED Diagnoses  Final diagnoses:  Viral syndrome  Acute midline low back pain without sciatica  [redacted] weeks gestation of pregnancy    ED Discharge Orders    None       Margarita Grizzleay, Kloe Oates, MD 03/20/17 2009

## 2017-08-08 ENCOUNTER — Encounter (HOSPITAL_COMMUNITY): Payer: Self-pay | Admitting: Emergency Medicine

## 2017-08-08 ENCOUNTER — Ambulatory Visit (HOSPITAL_COMMUNITY)
Admission: EM | Admit: 2017-08-08 | Discharge: 2017-08-08 | Disposition: A | Discharge status: Home / Self Care | Payer: Medicaid Other | Attending: Family Medicine | Admitting: Family Medicine

## 2017-08-08 DIAGNOSIS — H9203 Otalgia, bilateral: Secondary | ICD-10-CM

## 2017-08-08 DIAGNOSIS — H65192 Other acute nonsuppurative otitis media, left ear: Secondary | ICD-10-CM | POA: Diagnosis not present

## 2017-08-08 DIAGNOSIS — H60392 Other infective otitis externa, left ear: Secondary | ICD-10-CM | POA: Diagnosis not present

## 2017-08-08 DIAGNOSIS — H6122 Impacted cerumen, left ear: Secondary | ICD-10-CM | POA: Diagnosis not present

## 2017-08-08 DIAGNOSIS — H6592 Unspecified nonsuppurative otitis media, left ear: Secondary | ICD-10-CM

## 2017-08-08 MED ORDER — CIPROFLOXACIN HCL 0.2 % OT SOLN: 0.2000 mL | Freq: Two times a day (BID) | OTIC | 0 refills | Status: DC

## 2017-08-08 MED ORDER — AMOXICILLIN-POT CLAVULANATE 875-125 MG PO TABS: 1.0000 | ORAL_TABLET | Freq: Two times a day (BID) | ORAL | 0 refills | Status: AC

## 2017-08-08 NOTE — ED Triage Notes (Signed)
Pt sts bilateral ear pain

## 2017-08-08 NOTE — ED Provider Notes (Signed)
[facility]    CSN: [id number] Arrival date & time: 08/08/17  1005     History   Chief Complaint Chief Complaint  Patient presents with  . Otalgia    HPI Theresa Lawson is a 27 y.o. female.   Complains of bilateral ear pain L>R that began 2 days ago.  She denies a precipitating event, such as swimming or wearing ear plugs.  She has tried OTC medications with temporary relief.  Her symptoms are made worse with air.  She denies similar symptoms in the past.       Past Medical History:  Diagnosis Date  . Asthma    childhood  . Headache(784.0)    SINCE CURRENT PREGNANCY  . Infection    YEAST INF;NOT FREQ  . Irregular periods/menstrual cycles 12/18/2011  . Polyhydramnios 2010   WAS GETTING REG NST'S DURING PREG  . Pregnancy induced hypertension 2010   NO MEDS;HAD INCREASED STESS  . Pyelonephritis    DURING AND BEFORE PREG;WAS HOSPITALIZED PREVIOUSLY  . Seasonal allergies   . Unsure LMP 12/18/2011   EDC by [redacted]w[redacted]d u/s at [facility]    Patient Active Problem List   Diagnosis Date Noted  . Gestational hypertension w/o significant proteinuria in 3rd trimester 05/14/2015  . Gestational hypertension without significant proteinuria in third trimester 05/14/2015  . Irregular periods/menstrual cycles 12/18/2011  . Hx of pyelonephritis during pregnancy 09/18/2011  . ANEMIA 05/03/2010  . TOBACCO ABUSE 05/03/2010  . ALLERGIC RHINITIS 05/03/2010  . ASTHMA, CHILDHOOD 05/03/2010  . VAGINAL DISCHARGE 05/03/2010  . HEADACHE 05/03/2010    Past Surgical History:  Procedure Laterality Date  . NO PAST SURGERIES    . VAGINAL DELIVERY      OB History    Gravida  4   Para  3   Term  3   Preterm  0   AB  0   Living  3     SAB  0   TAB  0   Ectopic  0   Multiple  0   Live Births  3            Home Medications    Prior to Admission medications   Medication Sig Start Date End Date Taking? Authorizing Provider  amoxicillin-clavulanate (AUGMENTIN) 875-125  MG tablet Take 1 tablet by mouth every 12 (twelve) hours for 10 days. 08/08/17 08/18/17  Shayleen Eppinger, Lowanda Foster, PA-C  Ciprofloxacin HCl 0.2 % otic solution Place 0.2 mLs into the left ear 2 (two) times daily. 08/08/17   Shekela Goodridge, [place], PA-C  metroNIDAZOLE (FLAGYL) 500 MG tablet Take 1 tablet (500 mg total) by mouth 2 (two) times daily. Patient not taking: Reported on 03/20/2017 10/05/16   Lucia Estelle, NP  sulfamethoxazole-trimethoprim (BACTRIM DS,SEPTRA DS) 800-160 MG tablet Take 1 tablet by mouth 2 (two) times daily. Patient not taking: Reported on 03/20/2017 12/15/16   Mardella Layman, MD    Family History Family History  Problem Relation Age of Onset  . Asthma Sister   . Asthma Son   . Asthma Brother        X 2  . Nephrolithiasis Mother        HAS HAD 2-3 SURGERIES TO REMOVE    Social History Social History   Tobacco Use  . Smoking status: Former Smoker    Types: Cigarettes    Last attempt to quit: 05/20/2011    Years since quitting: 6.2  . Smokeless tobacco: Never Used  Substance Use Topics  . Alcohol use: No  .  Drug use: Yes    Types: Marijuana     Allergies   Patient has no known allergies.   Review of Systems Review of Systems  Constitutional: Negative for chills and fever.  HENT: Positive for congestion, ear pain, rhinorrhea and tinnitus. Negative for ear discharge, sinus pressure, sinus pain and sore throat.   Respiratory: Negative for shortness of breath.   Cardiovascular: Negative for chest pain.  Gastrointestinal: Negative for abdominal pain, constipation, diarrhea, nausea and vomiting.     Physical Exam Triage Vital Signs ED Triage Vitals [08/08/17 1029]  Enc Vitals Group     BP 133/79     Pulse Rate 63     Resp 18     Temp 98.4 F (36.9 C)     Temp Source Oral     SpO2 100 %     Weight      Height      Head Circumference      Peak Flow      Pain Score      Pain Loc      Pain Edu?      Excl. in GC?    No data found.  Updated Vital Signs BP 133/79  (BP Location: Right Arm)   Pulse 63   Temp 98.4 F (36.9 C) (Oral)   Resp 18   LMP 01/16/2017 (LMP Unknown)   SpO2 100%   Breastfeeding? Unknown    Physical Exam  Constitutional: She is oriented to person, place, and time. She appears well-developed and well-nourished. No distress.  HENT:  Head: Normocephalic and atraumatic.  Right Ear: External ear normal.  Nose: Nose normal.  Mouth/Throat: Oropharynx is clear and moist. No oropharyngeal exudate.  RT EAC and TM non-erythematous with mild fluid  LT EAR with cerumen impaction. LT Ear lavage performed.   Mild tragal tenderness LT EAC erythematous.   LT TM with purulent fluid  No mastoid tenderness  Eyes: Pupils are equal, round, and reactive to light. EOM are normal. No scleral icterus.  Neck: Normal range of motion. Neck supple.  Cardiovascular: Normal rate, regular rhythm and normal heart sounds. Exam reveals no friction rub.  No murmur heard. Radial pulse 2+ bilaterally    Pulmonary/Chest: Effort normal and breath sounds normal. No stridor. No respiratory distress. She has no wheezes.  Musculoskeletal:  Ambulates from chair to exam table without difficulty  Lymphadenopathy:    She has no cervical adenopathy.  Neurological: She is alert and oriented to person, place, and time.  Skin: Skin is warm and dry. Capillary refill takes less than 2 seconds. She is not diaphoretic.  Psychiatric: She has a normal mood and affect. Her behavior is normal. Judgment and thought content normal.  Nursing note and vitals reviewed.    UC Treatments / Results  Labs (all labs ordered are listed, but only abnormal results are displayed) Labs Reviewed - No data to display  EKG None  Radiology No results found.  Procedures Procedures (including critical care time)  Medications Ordered in UC Medications - No data to display  Initial Impression / Assessment and Plan / UC Course  I have reviewed the triage vital signs and the  nursing notes.  Pertinent labs & imaging results that were available during my care of the patient were reviewed by me and considered in my medical decision making (see chart for details).     Patient presents with bilateral ear pain.  Hx, symptoms, and PE consistent with OM and OE.  Prescribed augmentin  and cipro drops.  Will continue to use OTC medications as needed for pain.  Will follow up with PCP if symptoms persist.  Return and ER precautions given.   Final Clinical Impressions(s) / UC Diagnoses   Final diagnoses:  Other infective acute otitis externa of left ear  Left non-suppurative otitis media     Discharge Instructions     Rest and drink plenty of fluids Prescribed augmentin  Prescribed ciprofloxacin ear drops Take medications as directed and to completion Continue to use OTC ibuprofen and/ or tylenol as needed for pain control Follow up with PCP if symptoms persists Return here or go to the ER if you have any new or worsening symptoms    ED Prescriptions    Medication Sig Dispense Auth. Provider   amoxicillin-clavulanate (AUGMENTIN) 875-125 MG tablet Take 1 tablet by mouth every 12 (twelve) hours for 10 days. 20 tablet Maddyson Keil, [place], PA-C   Ciprofloxacin HCl 0.2 % otic solution Place 0.2 mLs into the left ear 2 (two) times daily. 14 vial Zoeann Mol, [place], PA-C     Controlled Substance Prescriptions [place] Controlled Substance Registry consulted? Not Applicable   Rennis Harding, [place] 08/08/17 1137

## 2017-08-08 NOTE — Discharge Instructions (Signed)
Rest and drink plenty of fluids Prescribed augmentin  Prescribed ciprofloxacin ear drops Take medications as directed and to completion Continue to use OTC ibuprofen and/ or tylenol as needed for pain control Follow up with PCP if symptoms persists Return here or go to the ER if you have any new or worsening symptoms  

## 2017-09-15 ENCOUNTER — Ambulatory Visit (HOSPITAL_COMMUNITY)
Admission: EM | Admit: 2017-09-15 | Discharge: 2017-09-15 | Disposition: A | Discharge status: Home / Self Care | Payer: Medicaid Other | Attending: Family Medicine | Admitting: Family Medicine

## 2017-09-15 ENCOUNTER — Encounter (HOSPITAL_COMMUNITY): Payer: Self-pay | Admitting: Family Medicine

## 2017-09-15 ENCOUNTER — Ambulatory Visit (INDEPENDENT_AMBULATORY_CARE_PROVIDER_SITE_OTHER): Payer: Medicaid Other

## 2017-09-15 DIAGNOSIS — M25552 Pain in left hip: Secondary | ICD-10-CM | POA: Diagnosis not present

## 2017-09-15 DIAGNOSIS — S40029A Contusion of unspecified upper arm, initial encounter: Secondary | ICD-10-CM | POA: Diagnosis not present

## 2017-09-15 DIAGNOSIS — M79652 Pain in left thigh: Secondary | ICD-10-CM | POA: Diagnosis not present

## 2017-09-15 DIAGNOSIS — T07XXXA Unspecified multiple injuries, initial encounter: Secondary | ICD-10-CM

## 2017-09-15 DIAGNOSIS — M25562 Pain in left knee: Secondary | ICD-10-CM

## 2017-09-15 MED ORDER — CYCLOBENZAPRINE HCL 10 MG PO TABS: 10.0000 mg | ORAL_TABLET | Freq: Every evening | ORAL | 0 refills | Status: DC | PRN

## 2017-09-15 MED ORDER — NAPROXEN 500 MG PO TABS: 500.0000 mg | ORAL_TABLET | Freq: Two times a day (BID) | ORAL | 0 refills | Status: DC | PRN

## 2017-09-15 NOTE — Discharge Instructions (Addendum)
Crutches given Knee immobilizer given Continue conservative management of rest, ice, and gentle stretches Take naproxen as needed for pain relief (may cause abdominal discomfort, ulcers, and GI bleeds avoid taking with other NSAIDs) Take cyclobenzaprine at nighttime for symptomatic relief. Avoid driving or operating heavy machinery while using medication. Follow up with PCP if symptoms persist Return or go to the ER if you have any new or worsening symptoms (fever, chills, chest pain, abdominal pain, changes in bowel or bladder habits, pain radiating into lower legs, etc...)
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
## 2017-09-17 ENCOUNTER — Telehealth (HOSPITAL_COMMUNITY): Payer: Self-pay | Admitting: Emergency Medicine

## 2018-04-04 NOTE — L&D Delivery Note (Signed)
[facility]N [facility] Delivery Note  Theresa Lawson is a 28 y.o. [id number] s/p vaginal delivery at [redacted]w[redacted]d. She was admitted for IOL for PD.   ROM: 2h 89m with clear fluid GBS Status: positive, received adequate doses of PCN prior to delivery Maximum Maternal Temperature: 98.4 F  Labor Progress: . Admitted for IOL. Received FB, one dose of misoprostol, and was subsequently started on picotin. Was AROMed after receiving epidural and progressed to complete.  Delivery Date/Time: 01/22/19, 1848 hours Delivery: Called to room and patient was complete and pushing. Head delivered straight OA. No nuchal cord present. Shoulder and body delivered in usual fashion. Infant with spontaneous cry, placed on mother's abdomen, dried and stimulated. Cord clamped x 2 after 1-minute delay, and cut by MOB under my direct supervision. Cord blood drawn. Placenta delivered spontaneously with gentle cord traction. Fundus firm with massage and Pitocin. Labia, perineum, vagina, and cervix were inspected, bilateral labial and a perineal abrasion were noted (hemostatic and did not require repair).   Placenta: intact, 3 vessel cord Complications: none Lacerations: bilateral labial abrasions and a perineal abrasion, all hemostatic QBL: 389 mL Analgesia: Epidural  Postpartum Planning [x]  message to sent to schedule follow-up  [x]  vaccines UTD  Infant: female  APGARs 9, 9  weight per medical record  Merilyn Baba, DO OB/GYN Fellow, Faculty Practice

## 2018-07-08 ENCOUNTER — Encounter (HOSPITAL_COMMUNITY): Payer: Self-pay | Admitting: Emergency Medicine

## 2018-07-08 ENCOUNTER — Other Ambulatory Visit: Payer: Self-pay

## 2018-07-08 ENCOUNTER — Ambulatory Visit (HOSPITAL_COMMUNITY)
Admission: EM | Admit: 2018-07-08 | Discharge: 2018-07-08 | Disposition: A | Discharge status: Home / Self Care | Payer: Medicaid Other | Attending: Family Medicine | Admitting: Family Medicine

## 2018-07-08 DIAGNOSIS — H9202 Otalgia, left ear: Secondary | ICD-10-CM

## 2018-07-08 MED ORDER — FLUTICASONE PROPIONATE 50 MCG/ACT NA SUSP: 1.0000 | Freq: Every day | NASAL | 0 refills | Status: DC

## 2018-07-08 MED ORDER — CETIRIZINE HCL 10 MG PO CAPS: 10.0000 mg | ORAL_CAPSULE | Freq: Every day | ORAL | 0 refills | Status: DC

## 2018-07-08 NOTE — ED Provider Notes (Signed)
[facility]    CSN: [id number] Arrival date & time: 07/08/18  1236     History   Chief Complaint Chief Complaint  Patient presents with  . Otalgia    HPI Theresa Lawson is a 28 y.o. female currently [redacted] weeks pregnant, history of asthma, tobacco use presenting today for evaluation of left ear pain.  Patient has had left ear pain for the past 3 days.  She has had a discomfort around this area and is unsure if she has infection or clogged ears.  Occasionally will have ringing and feels like a pressure sensation.  She is tried taking some Benadryl without relief.  She is also tried Advil last night.  Denies any fevers.  Denies associated URI symptoms of congestion cough or sore throat.  HPI  Past Medical History:  Diagnosis Date  . Asthma    childhood  . Headache(784.0)    SINCE CURRENT PREGNANCY  . Infection    YEAST INF;NOT FREQ  . Irregular periods/menstrual cycles 12/18/2011  . Polyhydramnios 2010   WAS GETTING REG NST'S DURING PREG  . Pregnancy induced hypertension 2010   NO MEDS;HAD INCREASED STESS  . Pyelonephritis    DURING AND BEFORE PREG;WAS HOSPITALIZED PREVIOUSLY  . Seasonal allergies   . Unsure LMP 12/18/2011   EDC by [redacted]w[redacted]d u/s at [facility]    Patient Active Problem List   Diagnosis Date Noted  . Gestational hypertension w/o significant proteinuria in 3rd trimester 05/14/2015  . Gestational hypertension without significant proteinuria in third trimester 05/14/2015  . Irregular periods/menstrual cycles 12/18/2011  . Hx of pyelonephritis during pregnancy 09/18/2011  . ANEMIA 05/03/2010  . TOBACCO ABUSE 05/03/2010  . ALLERGIC RHINITIS 05/03/2010  . ASTHMA, CHILDHOOD 05/03/2010  . VAGINAL DISCHARGE 05/03/2010  . HEADACHE 05/03/2010    Past Surgical History:  Procedure Laterality Date  . NO PAST SURGERIES    . VAGINAL DELIVERY      OB History    Gravida  5   Para  3   Term  3   Preterm  0   AB  0   Living  3     SAB  0   TAB  0   Ectopic  0   Multiple  0   Live Births  3            Home Medications    Prior to Admission medications   Medication Sig Start Date End Date Taking? Authorizing Provider  Cetirizine HCl 10 MG CAPS Take 1 capsule (10 mg total) by mouth daily. 07/08/18   Wieters, Hallie C, PA-C  fluticasone (FLONASE) 50 MCG/ACT nasal spray Place 1-2 sprays into both nostrils daily for 7 days. 07/08/18 07/15/18  Wieters, Junius Creamer, PA-C    Family History Family History  Problem Relation Age of Onset  . Asthma Sister   . Asthma Son   . Asthma Brother        X 2  . Nephrolithiasis Mother        HAS HAD 2-3 SURGERIES TO REMOVE    Social History Social History   Tobacco Use  . Smoking status: Former Smoker    Types: Cigarettes    Last attempt to quit: 05/20/2011    Years since quitting: 7.1  . Smokeless tobacco: Never Used  Substance Use Topics  . Alcohol use: No  . Drug use: Yes    Types: Marijuana     Allergies   Patient has no known allergies.   Review  of Systems Review of Systems  Constitutional: Negative for activity change, appetite change, chills, fatigue and fever.  HENT: Positive for ear pain. Negative for congestion, rhinorrhea, sinus pressure, sore throat and trouble swallowing.   Eyes: Negative for discharge and redness.  Respiratory: Negative for cough, chest tightness and shortness of breath.   Cardiovascular: Negative for chest pain.  Gastrointestinal: Negative for abdominal pain, diarrhea, nausea and vomiting.  Musculoskeletal: Negative for myalgias.  Skin: Negative for rash.  Neurological: Negative for dizziness, light-headedness and headaches.     Physical Exam Triage Vital Signs ED Triage Vitals  Enc Vitals Group     BP 07/08/18 1302 (!) 91/57     Pulse Rate 07/08/18 1302 (!) 103     Resp 07/08/18 1302 18     Temp 07/08/18 1302 (!) 97.3 F (36.3 C)     Temp Source 07/08/18 1302 Oral     SpO2 07/08/18 1302 100 %     Weight --      Height --      Head  Circumference --      Peak Flow --      Pain Score 07/08/18 1303 8     Pain Loc --      Pain Edu? --      Excl. in GC? --    No data found.  Updated Vital Signs BP (!) 91/57 (BP Location: Left Arm)   Pulse (!) 103   Temp (!) 97.3 F (36.3 C) (Oral)   Resp 18   LMP 08/25/2017   SpO2 100%   Visual Acuity Right Eye Distance:   Left Eye Distance:   Bilateral Distance:    Right Eye Near:   Left Eye Near:    Bilateral Near:     Physical Exam Vitals signs and nursing note reviewed.  Constitutional:      General: She is not in acute distress.    Appearance: She is well-developed.  HENT:     Head: Normocephalic and atraumatic.     Ears:     Comments: Bilateral ears without tenderness to palpation of external auricle, tragus and mastoid, EAC's without erythema or swelling, TM's with good bony landmarks and cone of light. Non erythematous.     Nose:     Comments: Nasal mucosa slightly erythematous, nonswollen turbinates    Mouth/Throat:     Comments: Oral mucosa pink and moist, no tonsillar enlargement or exudate. Posterior pharynx patent and nonerythematous, no uvula deviation or swelling. Normal phonation. Eyes:     Conjunctiva/sclera: Conjunctivae normal.  Neck:     Musculoskeletal: Neck supple.     Comments: Full active range of motion of neck, no overlying neck swelling or erythema, no lymphadenopathy Cardiovascular:     Rate and Rhythm: Normal rate and regular rhythm.     Heart sounds: No murmur.  Pulmonary:     Effort: Pulmonary effort is normal. No respiratory distress.     Breath sounds: Normal breath sounds.     Comments: Breathing comfortably at rest, CTABL, no wheezing, rales or other adventitious sounds auscultated Abdominal:     Palpations: Abdomen is soft.     Tenderness: There is no abdominal tenderness.  Skin:    General: Skin is warm and dry.  Neurological:     Mental Status: She is alert.      UC Treatments / Results  Labs (all labs ordered  are listed, but only abnormal results are displayed) Labs Reviewed - No data to display  EKG  None  Radiology No results found.  Procedures Procedures (including critical care time)  Medications Ordered in UC Medications - No data to display  Initial Impression / Assessment and Plan / UC Course  I have reviewed the triage vital signs and the nursing notes.  Pertinent labs & imaging results that were available during my care of the patient were reviewed by me and considered in my medical decision making (see chart for details).     Ear exam normal, no sign of infection or cerumen impaction.  Possible eustachian tube dysfunction causing symptoms secondary to allergies.  Will recommend initiating Flonase and daily cetirizine.  Provided list of medicine safe in pregnancy.  Continue to monitor,Discussed strict return precautions. Patient verbalized understanding and is agreeable with plan.  Final Clinical Impressions(s) / UC Diagnoses   Final diagnoses:  Otalgia of left ear     Discharge Instructions     NO ear infection or wax build up Begin flonase daily and daily cetirizine/zyrtec to help with allergies   Follow up if pain changing, worsening, developing fever, neck stiffness   ED Prescriptions    Medication Sig Dispense Auth. Provider   fluticasone (FLONASE) 50 MCG/ACT nasal spray Place 1-2 sprays into both nostrils daily for 7 days. 1 g Wieters, Hallie C, PA-C   Cetirizine HCl 10 MG CAPS Take 1 capsule (10 mg total) by mouth daily. 20 capsule Wieters, Hallie C, PA-C     Controlled Substance Prescriptions [place] Controlled Substance Registry consulted? Not Applicable   Lew Dawes, [place] 07/08/18 1318

## 2018-07-08 NOTE — ED Triage Notes (Signed)
Pt here for left ear pain; pt sts currently [redacted] weeks pregnant

## 2018-07-08 NOTE — Discharge Instructions (Signed)
NO ear infection or wax build up Begin flonase daily and daily cetirizine/zyrtec to help with allergies   Follow up if pain changing, worsening, developing fever, neck stiffness

## 2018-08-01 ENCOUNTER — Encounter (HOSPITAL_COMMUNITY): Payer: Self-pay | Admitting: Emergency Medicine

## 2018-08-01 ENCOUNTER — Inpatient Hospital Stay (HOSPITAL_COMMUNITY)
Admission: AD | Admit: 2018-08-01 | Discharge: 2018-08-01 | Disposition: A | Discharge status: Home / Self Care | Payer: Medicaid Other | Attending: Obstetrics and Gynecology | Admitting: Obstetrics and Gynecology

## 2018-08-01 ENCOUNTER — Other Ambulatory Visit: Payer: Self-pay

## 2018-08-01 ENCOUNTER — Ambulatory Visit (HOSPITAL_COMMUNITY)
Admission: EM | Admit: 2018-08-01 | Discharge: 2018-08-01 | Disposition: A | Discharge status: Home / Self Care | Payer: Medicaid Other

## 2018-08-01 DIAGNOSIS — Z3A15 15 weeks gestation of pregnancy: Secondary | ICD-10-CM | POA: Diagnosis not present

## 2018-08-01 DIAGNOSIS — S0083XA Contusion of other part of head, initial encounter: Secondary | ICD-10-CM

## 2018-08-01 DIAGNOSIS — R109 Unspecified abdominal pain: Secondary | ICD-10-CM | POA: Diagnosis not present

## 2018-08-01 DIAGNOSIS — O9A212 Injury, poisoning and certain other consequences of external causes complicating pregnancy, second trimester: Secondary | ICD-10-CM

## 2018-08-01 DIAGNOSIS — R1012 Left upper quadrant pain: Secondary | ICD-10-CM | POA: Diagnosis not present

## 2018-08-01 DIAGNOSIS — R1032 Left lower quadrant pain: Secondary | ICD-10-CM | POA: Insufficient documentation

## 2018-08-01 DIAGNOSIS — Z87891 Personal history of nicotine dependence: Secondary | ICD-10-CM | POA: Insufficient documentation

## 2018-08-01 DIAGNOSIS — O26892 Other specified pregnancy related conditions, second trimester: Secondary | ICD-10-CM | POA: Diagnosis present

## 2018-08-01 MED ORDER — CYCLOBENZAPRINE HCL 10 MG PO TABS
10.0000 mg | ORAL_TABLET | Freq: Once | ORAL | Status: AC
  Administered 2018-08-01: 10 mg via ORAL
  Filled 2018-08-01: qty 1

## 2018-08-01 MED ORDER — IBUPROFEN 600 MG PO TABS
600.0000 mg | ORAL_TABLET | Freq: Once | ORAL | Status: AC
  Administered 2018-08-01: 15:00:00 600 mg via ORAL
  Filled 2018-08-01: qty 1

## 2018-08-01 MED ORDER — ACETAMINOPHEN 325 MG PO TABS
650.0000 mg | ORAL_TABLET | Freq: Once | ORAL | Status: AC
  Administered 2018-08-01: 650 mg via ORAL
  Filled 2018-08-01: qty 2

## 2018-08-01 MED ORDER — CYCLOBENZAPRINE HCL 10 MG PO TABS: 10.0000 mg | ORAL_TABLET | Freq: Two times a day (BID) | ORAL | 0 refills | Status: DC | PRN
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
## 2018-08-01 NOTE — ED Notes (Signed)
Patient transferred to ED for evaluation, ED charge nurse aware.

## 2018-08-01 NOTE — Discharge Instructions (Signed)
It is possible you have a nose fracture. Avoid blowing your nose for at least 1 week. Apply ice to your face for 20 minutes at a time. Take tylenol every 6 hours as needed for pain. Avoid contact sports/activities until your symptoms completely resolve. Return to the ER if you develop severely worsening headache, vision loss, uncontrollable vomiting, personality changes, severe pain with eye movement, or new or concerning symptoms.  -Try Flexeril three times a day for soreness; it is ok to take iburpofen until 28 weeks in pregnancy for soreness -Return to MAU if you start having stronger abdominal pain or vaginal bleeding.  -Keep next prenatal appt.
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
## 2018-08-13 ENCOUNTER — Other Ambulatory Visit: Payer: Self-pay

## 2018-08-13 ENCOUNTER — Ambulatory Visit (INDEPENDENT_AMBULATORY_CARE_PROVIDER_SITE_OTHER): Payer: Medicaid Other | Admitting: Advanced Practice Midwife

## 2018-08-13 ENCOUNTER — Other Ambulatory Visit (HOSPITAL_COMMUNITY)
Admission: RE | Admit: 2018-08-13 | Discharge: 2018-08-13 | Disposition: A | Discharge status: Home / Self Care | Payer: Medicaid Other | Source: Ambulatory Visit | Attending: Advanced Practice Midwife | Admitting: Advanced Practice Midwife

## 2018-08-13 ENCOUNTER — Encounter: Payer: Self-pay | Admitting: Advanced Practice Midwife

## 2018-08-13 VITALS — BP 116/69 | HR 105 | Temp 98.6°F | Wt 154.9 lb

## 2018-08-13 DIAGNOSIS — O9989 Other specified diseases and conditions complicating pregnancy, childbirth and the puerperium: Secondary | ICD-10-CM | POA: Diagnosis not present

## 2018-08-13 DIAGNOSIS — Z3480 Encounter for supervision of other normal pregnancy, unspecified trimester: Secondary | ICD-10-CM | POA: Diagnosis not present

## 2018-08-13 DIAGNOSIS — M549 Dorsalgia, unspecified: Secondary | ICD-10-CM

## 2018-08-13 DIAGNOSIS — Z3A16 16 weeks gestation of pregnancy: Secondary | ICD-10-CM

## 2018-08-13 DIAGNOSIS — J302 Other seasonal allergic rhinitis: Secondary | ICD-10-CM

## 2018-08-13 MED ORDER — COMFORT FIT MATERNITY SUPP MED MISC: 1.0000 | Freq: Every day | 0 refills | Status: DC

## 2018-08-13 MED ORDER — CETIRIZINE HCL 10 MG PO TABS: 10.0000 mg | ORAL_TABLET | Freq: Every day | ORAL | 5 refills | Status: DC

## 2018-08-13 NOTE — Progress Notes (Signed)
Subjective:   Theresa Lawson is a 28 y.o. [phone] at 4331w6d by LMP being seen today for her first obstetrical visit.  Her obstetrical history is significant for NSVD x 3, GHTN with last pregnancy only and has TOBACCO ABUSE; ALLERGIC RHINITIS; ASTHMA, CHILDHOOD; HEADACHE; Hx of pyelonephritis during pregnancy; and Supervision of other normal pregnancy, antepartum on their problem list.. Patient does intend to breast feed. Pregnancy history fully reviewed.  Patient reports backache.  HISTORY: OB History  Gravida Para Term Preterm AB Living  5 3 3  0 1 3  SAB TAB Ectopic Multiple Live Births  0 1 0 0 3    # Outcome Date GA Lbr Len/2nd Weight Sex Delivery Anes PTL Lv  5 Current           4 Term 05/14/15 4428w2d 03:41 / 00:09 3475 g M Vag-Spont EPI  LIV     Name: Nicolasa DuckingBASH,BOY Malary     Apgar1: 8  Apgar5: 9  3 Term 01/19/12 7178w4d / 00:26 3371 g F Vag-Spont EPI  LIV     Birth Comments: wnl     Name: Cato MulliganBASH,GIRL Dessie     Apgar1: 8  Apgar5: 9  2 Term 09/21/08 3325w0d 02:00 3827 g M Vag-Spont EPI  LIV     Birth Comments: NO COMPLICATIONS     Name: ZIAIRE  1 TAB            Past Medical History:  Diagnosis Date  . Asthma    childhood  . Headache(784.0)    SINCE CURRENT PREGNANCY  . Infection    YEAST INF;NOT FREQ  . Irregular periods/menstrual cycles 12/18/2011  . Polyhydramnios 2010   WAS GETTING REG NST'S DURING PREG  . Pregnancy induced hypertension 2010   NO MEDS;HAD INCREASED STESS  . Pyelonephritis    DURING AND BEFORE PREG;WAS HOSPITALIZED PREVIOUSLY  . Seasonal allergies   . Unsure LMP 12/18/2011   [facility]   Past Surgical History:  Procedure Laterality Date  . NO PAST SURGERIES    . VAGINAL DELIVERY     Family History  Problem Relation Age of Onset  . Asthma Sister   . Asthma Son   . Asthma Brother        X 2  . Nephrolithiasis Mother        HAS HAD 2-3 SURGERIES TO REMOVE   Social History   Tobacco Use  . Smoking status: Former Smoker    Types:  Cigarettes    Last attempt to quit: 05/20/2011    Years since quitting: 7.2  . Smokeless tobacco: Never Used  Substance Use Topics  . Alcohol use: No  . Drug use: Not Currently    Types: Marijuana   No Known Allergies Current Outpatient Medications on File Prior to Visit  Medication Sig Dispense Refill  . Prenatal Vit w/Fe-Methylfol-FA (PNV PO) Take by mouth.    . Cetirizine HCl 10 MG CAPS Take 1 capsule (10 mg total) by mouth daily. (Patient not taking: Reported on 08/13/2018) 20 capsule 0  . cyclobenzaprine (FLEXERIL) 10 MG tablet Take 1 tablet (10 mg total) by mouth 2 (two) times daily as needed for muscle spasms. (Patient not taking: Reported on 08/13/2018) 20 tablet 0  . fluticasone (FLONASE) 50 MCG/ACT nasal spray Place 1-2 sprays into both nostrils daily for 7 days. 1 g 0   No current facility-administered medications on file prior to visit.      Exam   Vitals:  08/13/18 1006  BP: 116/69  Pulse: (!) 105  Temp: 98.6 F (37 C)  Weight: 70.3 kg   Fetal Heart Rate (bpm): 159  Uterus:     Pelvic Exam: Perineum: no hemorrhoids, normal perineum   Vulva: normal external genitalia, no lesions   Vagina:  normal mucosa, normal discharge   Cervix: no lesions and normal, pap smear done.    Adnexa: normal adnexa and no mass, fullness, tenderness   Bony Pelvis: average  System: General: well-developed, well-nourished female in no acute distress   Breast:  normal appearance, no masses or tenderness   Skin: normal coloration and turgor, no rashes   Neurologic: oriented, normal, negative, normal mood   Extremities: normal strength, tone, and muscle mass, ROM of all joints is normal   HEENT PERRLA, extraocular movement intact and sclera clear, anicteric   Mouth/Teeth mucous membranes moist, pharynx normal without lesions and dental hygiene good   Neck supple and no masses   Cardiovascular: regular rate and rhythm   Respiratory:  no respiratory distress, normal breath sounds    Abdomen: soft, non-tender; bowel sounds normal; no masses,  no organomegaly     Assessment:   Pregnancy: [id number] Patient Active Problem List   Diagnosis Date Noted  . Supervision of other normal pregnancy, antepartum 08/13/2018  . Hx of pyelonephritis during pregnancy 09/18/2011  . TOBACCO ABUSE 05/03/2010  . ALLERGIC RHINITIS 05/03/2010  . ASTHMA, CHILDHOOD 05/03/2010  . HEADACHE 05/03/2010     Plan:  1. Supervision of other normal pregnancy, antepartum --Anticipatory guidance about next visits/weeks of pregnancy given. --Reviewed safety, visitor policy, reassurance about COVID-19 for pregnancy at this time. Discussed possible changes to visits, including televisits, that may occur due to COVID-19.  The office remains open if pt needs to be seen and MAU is open 24 hours/day for OB emergencies.  - Enroll Patient in Babyscripts - Babyscripts Schedule Optimization - Cervicovaginal ancillary only( Lydia) - Genetic Screening - Obstetric Panel, Including HIV - Culture, OB Urine - [place] MFM OB COMP + 14 WK; Future - Prenatal Vit w/Fe-Methylfol-FA (PNV PO); Take by mouth.  2. Back pain affecting pregnancy in second trimester --Rest/ice/heat/Tylenol - Elastic Bandages & Supports (COMFORT FIT MATERNITY SUPP MED) MISC; 1 Device by Does not apply route daily.  Dispense: 1 each; Refill: 0  3. Seasonal allergies - cetirizine (ZYRTEC) 10 MG tablet; Take 1 tablet (10 mg total) by mouth daily.  Dispense: 30 tablet; Refill: 5    Initial labs drawn. Continue prenatal vitamins. Discussed and offered genetic screening options, including Quad screen/AFP, NIPS testing, and option to decline testing. Benefits/risks/alternatives reviewed. Pt aware that anatomy US is form of genetic screening with lower accuracy in detecting trisomies than blood work.  Pt chooses genetic screening today. NIPS: ordered. Ultrasound discussed; fetal anatomic survey: ordered. Problem list reviewed and updated. The  nature of [facility] with multiple MDs and other Advanced Practice Providers was explained to patient; also emphasized that residents, students are part of our team. Routine obstetric precautions reviewed. No follow-ups on file.   Sharen Counter, CNM 08/13/18 1:35 PM

## 2018-08-13 NOTE — Progress Notes (Signed)
Pt presents for NOB visit. This is not a planned pregnancy and FOB is involved.

## 2018-08-13 NOTE — Patient Instructions (Signed)

## 2018-08-14 LAB — OBSTETRIC PANEL, INCLUDING HIV
Antibody Screen: NEGATIVE
Basophils Absolute: 0 10*3/uL (ref 0.0–0.2)
Basos: 0 %
EOS (ABSOLUTE): 0.3 10*3/uL (ref 0.0–0.4)
Eos: 3 %
HIV Screen 4th Generation wRfx: NONREACTIVE
Hematocrit: 32 % — ABNORMAL LOW (ref 34.0–46.6)
Hemoglobin: 10.7 g/dL — ABNORMAL LOW (ref 11.1–15.9)
Hepatitis B Surface Ag: NEGATIVE
Immature Grans (Abs): 0.1 10*3/uL (ref 0.0–0.1)
Immature Granulocytes: 1 %
Lymphocytes Absolute: 1.1 10*3/uL (ref 0.7–3.1)
Lymphs: 14 %
MCH: 28.7 pg (ref 26.6–33.0)
MCHC: 33.4 g/dL (ref 31.5–35.7)
MCV: 86 fL (ref 79–97)
Monocytes Absolute: 0.5 10*3/uL (ref 0.1–0.9)
Monocytes: 6 %
Neutrophils Absolute: 6.3 10*3/uL (ref 1.4–7.0)
Neutrophils: 76 %
Platelets: 237 10*3/uL (ref 150–450)
RBC: 3.73 x10E6/uL — ABNORMAL LOW (ref 3.77–5.28)
RDW: 13.1 % (ref 11.7–15.4)
RPR Ser Ql: NONREACTIVE
Rh Factor: POSITIVE
Rubella Antibodies, IGG: 6.87 index (ref >0.99)
WBC: 8.2 10*3/uL (ref 3.4–10.8)

## 2018-08-14 LAB — CERVICOVAGINAL ANCILLARY ONLY
Bacterial vaginitis: POSITIVE — AB
Candida vaginitis: NEGATIVE
Chlamydia: NEGATIVE
Neisseria Gonorrhea: POSITIVE — AB
Trichomonas: NEGATIVE

## 2018-08-15 ENCOUNTER — Other Ambulatory Visit: Payer: Self-pay | Admitting: *Deleted

## 2018-08-15 DIAGNOSIS — B9689 Other specified bacterial agents as the cause of diseases classified elsewhere: Secondary | ICD-10-CM

## 2018-08-15 DIAGNOSIS — A549 Gonococcal infection, unspecified: Secondary | ICD-10-CM

## 2018-08-15 DIAGNOSIS — N760 Acute vaginitis: Secondary | ICD-10-CM

## 2018-08-15 LAB — URINE CULTURE, OB REFLEX

## 2018-08-15 LAB — CULTURE, OB URINE

## 2018-08-15 MED ORDER — METRONIDAZOLE 0.75 % VA GEL: 1.0000 | Freq: Two times a day (BID) | VAGINAL | 0 refills | Status: DC

## 2018-08-15 MED ORDER — AZITHROMYCIN 250 MG PO TABS: 1000.0000 mg | ORAL_TABLET | Freq: Once | ORAL | 0 refills | Status: AC

## 2018-08-15 NOTE — Progress Notes (Signed)
See lab note.  

## 2018-08-16 ENCOUNTER — Ambulatory Visit (INDEPENDENT_AMBULATORY_CARE_PROVIDER_SITE_OTHER): Payer: Medicaid Other

## 2018-08-16 ENCOUNTER — Other Ambulatory Visit: Payer: Self-pay

## 2018-08-16 VITALS — BP 109/69 | HR 105

## 2018-08-16 DIAGNOSIS — A549 Gonococcal infection, unspecified: Secondary | ICD-10-CM

## 2018-08-16 MED ORDER — CEFTRIAXONE SODIUM 250 MG IJ SOLR
250.0000 mg | Freq: Once | INTRAMUSCULAR | Status: AC
  Administered 2018-08-16: 250 mg via INTRAMUSCULAR

## 2018-08-16 NOTE — Progress Notes (Signed)
Pt is in the office for rocephin injection, administered in RUOQ and pt tolerated well. .. Administrations This Visit    cefTRIAXone (ROCEPHIN) injection 250 mg    Admin Date 08/16/2018 Action Given Dose 250 mg Route Intramuscular Administered By Katrina Stack, RN

## 2018-08-17 NOTE — Progress Notes (Signed)
Agree with A & P. 

## 2018-08-20 ENCOUNTER — Encounter: Payer: Self-pay | Admitting: Advanced Practice Midwife

## 2018-08-21 ENCOUNTER — Encounter: Payer: Self-pay | Admitting: Advanced Practice Midwife

## 2018-08-30 ENCOUNTER — Ambulatory Visit (HOSPITAL_COMMUNITY)
Admission: RE | Admit: 2018-08-30 | Discharge: 2018-08-30 | Disposition: A | Discharge status: Home / Self Care | Payer: Medicaid Other | Source: Ambulatory Visit | Attending: Advanced Practice Midwife | Admitting: Advanced Practice Midwife

## 2018-08-30 ENCOUNTER — Other Ambulatory Visit: Payer: Self-pay | Admitting: Advanced Practice Midwife

## 2018-08-30 ENCOUNTER — Ambulatory Visit (HOSPITAL_COMMUNITY): Payer: Medicaid Other

## 2018-08-30 ENCOUNTER — Other Ambulatory Visit (HOSPITAL_COMMUNITY): Payer: Medicaid Other

## 2018-08-30 ENCOUNTER — Other Ambulatory Visit (HOSPITAL_COMMUNITY): Payer: Self-pay | Admitting: *Deleted

## 2018-08-30 ENCOUNTER — Other Ambulatory Visit: Payer: Self-pay

## 2018-08-30 DIAGNOSIS — O09292 Supervision of pregnancy with other poor reproductive or obstetric history, second trimester: Secondary | ICD-10-CM | POA: Diagnosis not present

## 2018-08-30 DIAGNOSIS — Z3480 Encounter for supervision of other normal pregnancy, unspecified trimester: Secondary | ICD-10-CM | POA: Diagnosis not present

## 2018-08-30 DIAGNOSIS — O99332 Smoking (tobacco) complicating pregnancy, second trimester: Secondary | ICD-10-CM

## 2018-08-30 DIAGNOSIS — Z363 Encounter for antenatal screening for malformations: Secondary | ICD-10-CM | POA: Diagnosis not present

## 2018-08-30 DIAGNOSIS — O99012 Anemia complicating pregnancy, second trimester: Secondary | ICD-10-CM

## 2018-08-30 DIAGNOSIS — O99330 Smoking (tobacco) complicating pregnancy, unspecified trimester: Secondary | ICD-10-CM

## 2018-08-30 DIAGNOSIS — Z3A19 19 weeks gestation of pregnancy: Secondary | ICD-10-CM

## 2018-09-04 ENCOUNTER — Encounter: Payer: Medicaid Other | Admitting: Nurse Practitioner

## 2018-09-04 ENCOUNTER — Other Ambulatory Visit: Payer: Self-pay

## 2018-09-05 ENCOUNTER — Ambulatory Visit (INDEPENDENT_AMBULATORY_CARE_PROVIDER_SITE_OTHER): Payer: Medicaid Other

## 2018-09-05 VITALS — BP 134/68 | HR 92

## 2018-09-05 DIAGNOSIS — Z3480 Encounter for supervision of other normal pregnancy, unspecified trimester: Secondary | ICD-10-CM

## 2018-09-05 DIAGNOSIS — Z3A20 20 weeks gestation of pregnancy: Secondary | ICD-10-CM

## 2018-09-05 DIAGNOSIS — Z3482 Encounter for supervision of other normal pregnancy, second trimester: Secondary | ICD-10-CM

## 2018-09-05 NOTE — Progress Notes (Signed)
Pt is on the phone preparing for virtual visit with provider. [redacted]w[redacted]d. Anatomy scan US done on 08/30/18.

## 2018-09-05 NOTE — Progress Notes (Signed)
   TELEHEALTH VIRTUAL OBSTETRICS VISIT ENCOUNTER NOTE  I connected with Theresa Lawson on 09/05/18 at  1:30 PM EDT by WebEx at home and verified that I am speaking with the correct person using two identifiers.   I discussed the limitations, risks, security and privacy concerns of performing an evaluation and management service by telephone and the availability of in person appointments. I also discussed with the patient that there may be a patient responsible charge related to this service. The patient expressed understanding and agreed to proceed.  Subjective:  Theresa Lawson is a 28 y.o. [phone] at [redacted]w[redacted]d being followed for ongoing prenatal care.  She is currently monitored for the following issues for this low-risk pregnancy and has ALLERGIC RHINITIS; ASTHMA, CHILDHOOD; HEADACHE; Hx of pyelonephritis during pregnancy; and Supervision of other normal pregnancy, antepartum on their problem list.  Patient reports no complaints. Reports fetal movement. Denies any contractions, bleeding or leaking of fluid.   The following portions of the patient's history were reviewed and updated as appropriate: allergies, current medications, past family history, past medical history, past social history, past surgical history and problem list.   Objective:   General:  Alert, oriented and cooperative.   Mental Status: Normal mood and affect perceived. Normal judgment and thought content.  Rest of physical exam deferred due to type of encounter  Assessment and Plan:  Pregnancy: G5P3013 at [redacted]w[redacted]d 1. Supervision of other normal pregnancy, antepartum -BP 134/68. Discussed warning signs and when to call. Will continue to monitor -Reviewed results of anatomy scan with patient. Patient was scheduled for f/u growth at 32 weeks due to tobacco use. Patient denies any tobacco use in last 5 years. States she used to smoke but not now and not during the pregnancy. Will send message to MFM to cancel. -Anticipatory guidance of  next visit with GTT reviewed with patient.   Preterm labor symptoms and general obstetric precautions including but not limited to vaginal bleeding, contractions, leaking of fluid and fetal movement were reviewed in detail with the patient.  I discussed the assessment and treatment plan with the patient. The patient was provided an opportunity to ask questions and all were answered. The patient agreed with the plan and demonstrated an understanding of the instructions. The patient was advised to call back or seek an in-person office evaluation/go to MAU at [facility] for any urgent or concerning symptoms. Please refer to After Visit Summary for other counseling recommendations.   I provided 15 minutes of non-face-to-face time during this encounter.  Return in about 8 weeks (around 10/31/2018) for Return OB visit, 2hr GTT and labs.  Future Appointments  Date Time Provider Department Center  11/22/2018  1:15 PM [facility]-MFC [place] 4 WH-M[facility]US M[facility]-US    Rolm Bookbinder, CNM [facility] [employer], [facility]
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
## 2018-10-14 ENCOUNTER — Encounter (HOSPITAL_COMMUNITY): Payer: Self-pay

## 2018-10-14 ENCOUNTER — Ambulatory Visit (HOSPITAL_COMMUNITY)
Admission: EM | Admit: 2018-10-14 | Discharge: 2018-10-14 | Disposition: A | Discharge status: Home / Self Care | Payer: Medicaid Other | Attending: Emergency Medicine | Admitting: Emergency Medicine

## 2018-10-14 ENCOUNTER — Other Ambulatory Visit: Payer: Self-pay

## 2018-10-14 DIAGNOSIS — G5622 Lesion of ulnar nerve, left upper limb: Secondary | ICD-10-CM | POA: Diagnosis not present

## 2018-10-14 MED ORDER — PREDNISONE 10 MG PO TABS: 30.0000 mg | ORAL_TABLET | Freq: Every day | ORAL | 0 refills | Status: AC

## 2018-10-14 NOTE — Discharge Instructions (Addendum)
Please contact OBGYN before starting prednisone to see if they are okay with you taking this medicine 30 mg daily for 5 days  Ace wrap, limit bending motions

## 2018-10-14 NOTE — ED Triage Notes (Signed)
Pt present numbness in her left hand/fingers (pinky/ring) that radiates to her elbow. Pt denies any injury to her left hand/fingers or elbow.

## 2018-10-14 NOTE — ED Provider Notes (Signed)
[facility]    CSN: [id number] Arrival date & time: 10/14/18  1609      History   Chief Complaint Chief Complaint  Patient presents with  . Numbness    Left hand/elbow    HPI Theresa Lawson is a 28 y.o. female currently [redacted] weeks pregnant, presenting today for evaluation of hand numbness and tingling.  Patient states that over the past 2 days she has had numbness and tingling that radiates into her fourth and fifth fingers.  Symptoms started around her elbow and spread distally.  Denies any injury or change in activity.  She does work as a Conservation officer, naturecashier at a [employer] and does a lot of bending of her elbow.  Is unsure exactly how she sleeps as she notes that she does a lot of tossing and turning.  Denies history of similar.  She has not taken anything for symptoms.  Denies difficulty bending her fingers.  She has not noted any associated rash or changes in color bruising to face area.  HPI  Past Medical History:  Diagnosis Date  . Asthma    childhood  . Headache(784.0)    SINCE CURRENT PREGNANCY  . Infection    YEAST INF;NOT FREQ  . Irregular periods/menstrual cycles 12/18/2011  . Polyhydramnios 2010   WAS GETTING REG NST'S DURING PREG  . Pregnancy induced hypertension 2010   NO MEDS;HAD INCREASED STESS  . Pyelonephritis    DURING AND BEFORE PREG;WAS HOSPITALIZED PREVIOUSLY  . Seasonal allergies   . Unsure LMP 12/18/2011   [facility] 29[facility]d [facility]    Patient Active Problem List   Diagnosis Date Noted  . Supervision of other normal pregnancy, antepartum 08/13/2018  . Hx of pyelonephritis during pregnancy 09/18/2011  . ALLERGIC RHINITIS 05/03/2010  . ASTHMA, CHILDHOOD 05/03/2010  . HEADACHE 05/03/2010    Past Surgical History:  Procedure Laterality Date  . NO PAST SURGERIES    . VAGINAL DELIVERY      OB History    Gravida  5   Para  3   Term  3   Preterm  0   AB  1   Living  3     SAB  0   TAB  1   Ectopic  0   Multiple  0   Live Births   3            Home Medications    Prior to Admission medications   Medication Sig Start Date End Date Taking? Authorizing Provider  fluticasone (FLONASE) 50 MCG/ACT nasal spray Place 1-2 sprays into both nostrils daily for 7 days. 07/08/18 07/15/18  Ragina Fenter C, PA-C  predniSONE (DELTASONE) 10 MG tablet Take 3 tablets (30 mg total) by mouth daily with breakfast for 5 days. 10/14/18 10/19/18  Aadin Gaut C, PA-C  Prenatal Vit w/Fe-Methylfol-FA (PNV PO) Take by mouth.    [provider]  cetirizine (ZYRTEC) 10 MG tablet Take 1 tablet (10 mg total) by mouth daily. Patient not taking: Reported on 08/16/2018 08/13/18 10/14/18  Hurshel PartyLeftwich-Kirby, Lisa A, CNM    Family History Family History  Problem Relation Age of Onset  . Asthma Sister   . Asthma Son   . Asthma Brother        X 2  . Nephrolithiasis Mother        HAS HAD 2-3 SURGERIES TO REMOVE    Social History Social History   Tobacco Use  . Smoking status: Former Smoker  Types: Cigarettes    Quit date: 05/20/2011    Years since quitting: 7.4  . Smokeless tobacco: Never Used  Substance Use Topics  . Alcohol use: No  . Drug use: Not Currently    Types: Marijuana     Allergies   Patient has no known allergies.   Review of Systems Review of Systems  Constitutional: Negative for fatigue and fever.  Eyes: Negative for visual disturbance.  Respiratory: Negative for shortness of breath.   Cardiovascular: Negative for chest pain.  Gastrointestinal: Negative for abdominal pain, nausea and vomiting.  Musculoskeletal: Negative for arthralgias and joint swelling.  Skin: Negative for color change, rash and wound.  Neurological: Positive for numbness. Negative for dizziness, weakness, light-headedness and headaches.     Physical Exam Triage Vital Signs ED Triage Vitals  Enc Vitals Group     BP 10/14/18 1659 115/63     Pulse Rate 10/14/18 1659 97     Resp 10/14/18 1659 16     Temp 10/14/18 1659 98.7 F  (37.1 C)     Temp Source 10/14/18 1659 Oral     SpO2 10/14/18 1659 100 %     Weight --      Height --      Head Circumference --      Peak Flow --      Pain Score 10/14/18 1700 0     Pain Loc --      Pain Edu? --      Excl. in [place]? --    No data found.  Updated Vital Signs BP 115/63 (BP Location: Right Arm)   Pulse 97   Temp 98.7 F (37.1 C) (Oral)   Resp 16   LMP 04/18/2018 (Exact Date)   SpO2 100%   Visual Acuity Right Eye Distance:   Left Eye Distance:   Bilateral Distance:    Right Eye Near:   Left Eye Near:    Bilateral Near:     Physical Exam Vitals signs and nursing note reviewed.  Constitutional:      Appearance: She is well-developed.     Comments: No acute distress  HENT:     Head: Normocephalic and atraumatic.     Nose: Nose normal.  Eyes:     Conjunctiva/sclera: Conjunctivae normal.  Neck:     Musculoskeletal: Neck supple.  Cardiovascular:     Rate and Rhythm: Normal rate.  Pulmonary:     Effort: Pulmonary effort is normal. No respiratory distress.  Abdominal:     General: There is no distension.  Musculoskeletal: Normal range of motion.     Comments: No obvious deformity noted to left arm, no swelling or discoloration noted to elbow, forearm or wrist,  nontender to palpation over bony prominences of elbow, distal radius and ulna and throughout hand, grip strength 5/5 and equal bilaterally, radial pulse 2+, negative Tinel's, negative Finkelstein's  Full active range of motion at elbow  Radial pulse 2+, cap refill less than 2 seconds  Skin:    General: Skin is warm and dry.  Neurological:     Mental Status: She is alert and oriented to person, place, and time.      UC Treatments / Results  Labs (all labs ordered are listed, but only abnormal results are displayed) Labs Reviewed - No data to display  EKG   Radiology No results found.  Procedures Procedures (including critical care time)  Medications Ordered in UC Medications -  No data to display  Initial Impression /  Assessment and Plan / UC Course  I have reviewed the triage vital signs and the nursing notes.  Pertinent labs & imaging results that were available during my care of the patient were reviewed by me and considered in my medical decision making (see chart for details).     Patient has symptoms suggestive of cubital tunnel.  No injury, do not suspect underlying bony abnormality.  No overlying skin changes.  Neurovascularly intact.  Strength intact.  Provided Ace wrap to limit full flexion of elbow.  Discussed with patient trial of prednisone, but given pregnancy status advised to discuss this with her OB/GYN before initiating.  In the interim please stick to Tylenol.  Avoid bending motions.  Follow-up if symptoms not resolving or worsening.Discussed strict return precautions. Patient verbalized understanding and is agreeable with plan.  Final Clinical Impressions(s) / UC Diagnoses   Final diagnoses:  Cubital tunnel syndrome on left     Discharge Instructions     Please contact OBGYN before starting prednisone to see if they are okay with you taking this medicine 30 mg daily for 5 days  Ace wrap, limit bending motions   ED Prescriptions    Medication Sig Dispense Auth. Provider   predniSONE (DELTASONE) 10 MG tablet Take 3 tablets (30 mg total) by mouth daily with breakfast for 5 days. 15 tablet Jasraj Lappe, HenryHallie C, PA-C     Controlled Substance Prescriptions [place] Controlled Substance Registry consulted? Not Applicable   Lew DawesWieters, Glorine Hanratty C, [place] JerseyPA-C 10/14/18 2244

## 2018-10-31 ENCOUNTER — Other Ambulatory Visit: Payer: Medicaid Other

## 2018-10-31 ENCOUNTER — Ambulatory Visit (INDEPENDENT_AMBULATORY_CARE_PROVIDER_SITE_OTHER): Payer: Medicaid Other | Admitting: Certified Nurse Midwife

## 2018-10-31 ENCOUNTER — Encounter: Payer: Self-pay | Admitting: Certified Nurse Midwife

## 2018-10-31 ENCOUNTER — Other Ambulatory Visit (HOSPITAL_COMMUNITY)
Admission: RE | Admit: 2018-10-31 | Discharge: 2018-10-31 | Disposition: A | Discharge status: Home / Self Care | Payer: Medicaid Other | Source: Ambulatory Visit | Attending: Certified Nurse Midwife | Admitting: Certified Nurse Midwife

## 2018-10-31 ENCOUNTER — Other Ambulatory Visit: Payer: Self-pay

## 2018-10-31 VITALS — BP 110/66 | HR 82 | Wt 172.0 lb

## 2018-10-31 DIAGNOSIS — O09893 Supervision of other high risk pregnancies, third trimester: Secondary | ICD-10-CM

## 2018-10-31 DIAGNOSIS — O26843 Uterine size-date discrepancy, third trimester: Secondary | ICD-10-CM

## 2018-10-31 DIAGNOSIS — Z3A28 28 weeks gestation of pregnancy: Secondary | ICD-10-CM

## 2018-10-31 DIAGNOSIS — Z3480 Encounter for supervision of other normal pregnancy, unspecified trimester: Secondary | ICD-10-CM

## 2018-10-31 DIAGNOSIS — O09899 Supervision of other high risk pregnancies, unspecified trimester: Secondary | ICD-10-CM | POA: Diagnosis present

## 2018-10-31 NOTE — Progress Notes (Signed)
Pt states increase in pelvic pressure and cramping. Pt states she would like cervix check today. Pt declines tdap today

## 2018-10-31 NOTE — Patient Instructions (Signed)

## 2018-10-31 NOTE — Progress Notes (Signed)
   PRENATAL VISIT NOTE  Subjective:  Theresa Lawson is a 28 y.o. [phone] at [redacted]w[redacted]d being seen today for ongoing prenatal care.  She is currently monitored for the following issues for this low-risk pregnancy and has ALLERGIC RHINITIS; ASTHMA, CHILDHOOD; HEADACHE; Hx of pyelonephritis during pregnancy; Supervision of other normal pregnancy, antepartum; and Hx of maternal gonorrhea, currently pregnant on their problem list.  Patient reports occasional contractions.  Contractions: Irritability. Vag. Bleeding: None.  Movement: Present. Denies leaking of fluid.   The following portions of the patient's history were reviewed and updated as appropriate: allergies, current medications, past family history, past medical history, past social history, past surgical history and problem list.   Objective:   Vitals:   10/31/18 0923  BP: 110/66  Pulse: 82  Weight: 172 lb (78 kg)    Fetal Status: Fetal Heart Rate (bpm): 152 Fundal Height: 32 cm Movement: Present     General:  Alert, oriented and cooperative. Patient is in no acute distress.  Skin: Skin is warm and dry. No rash noted.   Cardiovascular: Normal heart rate noted  Respiratory: Normal respiratory effort, no problems with respiration noted  Abdomen: Soft, gravid, appropriate for gestational age.  Pain/Pressure: Present     Pelvic: Cervical exam performed Dilation: Closed Effacement (%): Thick Station: -3  Extremities: Normal range of motion.  Edema: None  Mental Status: Normal mood and affect. Normal behavior. Normal judgment and thought content.   Assessment and Plan:  Pregnancy: G5P3013 at [redacted]w[redacted]d 1. Supervision of other normal pregnancy, antepartum - Patient doing well, reports occasional BH contractions daily - Educated and discussed BH contractions, patient denies contraction occurring regularly, discussed BH contractions normal for 4th pregnancy and when she needs to be seen for evaluation  - Work note given to patient discussing  lifting restrictions and frequent breaks  - Patient reports continued pelvic pressure, request cervical examination for peace of mind, fetus vertex position, encouraged use of maternity support belt  - Glucose Tolerance, 2 Hours w/1 Hour - CBC - HIV Antibody (routine testing w rflx) - RPR  3. Size of fetus inconsistent with dates in third trimester - Size > Dates  - Watch FH closely, possible fetal growth [place] if FH difference continues    Preterm labor symptoms and general obstetric precautions including but not limited to vaginal bleeding, contractions, leaking of fluid and fetal movement were reviewed in detail with the patient. Please refer to After Visit Summary for other counseling recommendations.   Return in about 4 weeks (around 11/28/2018) for ROB.  Future Appointments  Date Time Provider [place]  11/28/2018  4:00 PM Shelly Bombard, MD [place] None    Lajean Manes, CNM

## 2018-11-01 LAB — CBC
Hematocrit: 32.5 % — ABNORMAL LOW (ref 34.0–46.6)
Hemoglobin: 10.9 g/dL — ABNORMAL LOW (ref 11.1–15.9)
MCH: 29.4 pg (ref 26.6–33.0)
MCHC: 33.5 g/dL (ref 31.5–35.7)
MCV: 88 fL (ref 79–97)
Platelets: 202 10*3/uL (ref 150–450)
RBC: 3.71 x10E6/uL — ABNORMAL LOW (ref 3.77–5.28)
RDW: 12.6 % (ref 11.7–15.4)
WBC: 9.5 10*3/uL (ref 3.4–10.8)

## 2018-11-01 LAB — GLUCOSE TOLERANCE, 2 HOURS W/ 1HR
Glucose, 1 hour: 114 mg/dL (ref 65–179)
Glucose, 2 hour: 99 mg/dL (ref 65–152)
Glucose, Fasting: 75 mg/dL (ref 65–91)

## 2018-11-01 LAB — RPR: RPR Ser Ql: NONREACTIVE

## 2018-11-01 LAB — CERVICOVAGINAL ANCILLARY ONLY
Chlamydia: NEGATIVE
Neisseria Gonorrhea: NEGATIVE
Trichomonas: NEGATIVE

## 2018-11-01 LAB — HIV ANTIBODY (ROUTINE TESTING W REFLEX): HIV Screen 4th Generation wRfx: NONREACTIVE

## 2018-11-09 ENCOUNTER — Telehealth: Payer: Self-pay

## 2018-11-09 ENCOUNTER — Other Ambulatory Visit: Payer: Self-pay

## 2018-11-09 DIAGNOSIS — O219 Vomiting of pregnancy, unspecified: Secondary | ICD-10-CM

## 2018-11-09 MED ORDER — ONDANSETRON HCL 8 MG PO TABS: 8.0000 mg | ORAL_TABLET | Freq: Four times a day (QID) | ORAL | 2 refills | Status: DC | PRN

## 2018-11-09 NOTE — Telephone Encounter (Signed)
Rx sent to pharmacy for nausea and vomiting. Per Dr. Jodi Mourning zofran 8mg  sent for patient to take q6h as needed

## 2018-11-22 ENCOUNTER — Ambulatory Visit (HOSPITAL_COMMUNITY): Payer: Medicaid Other

## 2018-11-22 ENCOUNTER — Encounter (HOSPITAL_COMMUNITY): Payer: Self-pay

## 2018-11-28 ENCOUNTER — Ambulatory Visit (INDEPENDENT_AMBULATORY_CARE_PROVIDER_SITE_OTHER): Payer: Medicaid Other | Admitting: Obstetrics

## 2018-11-28 ENCOUNTER — Encounter: Payer: Self-pay | Admitting: Obstetrics

## 2018-11-28 VITALS — BP 122/70 | HR 82

## 2018-11-28 DIAGNOSIS — G44201 Tension-type headache, unspecified, intractable: Secondary | ICD-10-CM

## 2018-11-28 DIAGNOSIS — Z3480 Encounter for supervision of other normal pregnancy, unspecified trimester: Secondary | ICD-10-CM

## 2018-11-28 DIAGNOSIS — O09893 Supervision of other high risk pregnancies, third trimester: Secondary | ICD-10-CM

## 2018-11-28 DIAGNOSIS — O09899 Supervision of other high risk pregnancies, unspecified trimester: Secondary | ICD-10-CM

## 2018-11-28 DIAGNOSIS — Z3A32 32 weeks gestation of pregnancy: Secondary | ICD-10-CM

## 2018-11-28 NOTE — Progress Notes (Signed)
   TELEHEALTH VIRTUAL OBSTETRICS VISIT ENCOUNTER NOTE  I connected with [name] on 11/28/18 at  4:00 PM EDT by telephone at home and verified that I am speaking with the correct person using two identifiers.   I discussed the limitations, risks, security and privacy concerns of performing an evaluation and management service by telephone and the availability of in person appointments. I also discussed with the patient that there may be a patient responsible charge related to this service. The patient expressed understanding and agreed to proceed.  Subjective:  Theresa Lawson is a 28 y.o. [phone] at [redacted]w[redacted]d being followed for ongoing prenatal care.  She is currently monitored for the following issues for this low-risk pregnancy and has ALLERGIC RHINITIS; ASTHMA, CHILDHOOD; HEADACHE; Hx of pyelonephritis during pregnancy; Supervision of other normal pregnancy, antepartum; and Hx of maternal gonorrhea, currently pregnant on their problem list.  Patient reports heartburn. Reports fetal movement. Denies any contractions, bleeding or leaking of fluid.   The following portions of the patient's history were reviewed and updated as appropriate: allergies, current medications, past family history, past medical history, past social history, past surgical history and problem list.   Objective:   General:  Alert, oriented and cooperative.   Mental Status: Normal mood and affect perceived. Normal judgment and thought content.  Rest of physical exam deferred due to type of encounter  Assessment and Plan:  Pregnancy: G5P3013 at [redacted]w[redacted]d 1. Supervision of other normal pregnancy, antepartum  2. Hx of maternal gonorrhea, currently pregnant, treated - has negative TOC  3. Intractable tension-type headache, unspecified chronicity pattern Rx: - Ambulatory referral to Neurology  Preterm labor symptoms and general obstetric precautions including but not limited to vaginal bleeding, contractions, leaking of  fluid and fetal movement were reviewed in detail with the patient.  I discussed the assessment and treatment plan with the patient. The patient was provided an opportunity to ask questions and all were answered. The patient agreed with the plan and demonstrated an understanding of the instructions. The patient was advised to call back or seek an in-person office evaluation/go to MAU at [facility] for any urgent or concerning symptoms. Please refer to After Visit Summary for other counseling recommendations.   I provided 10 minutes of non-face-to-face time during this encounter.  Return in about 2 weeks (around 12/12/2018) for [place], [place] for [facility] [place] [facility] 11/28/2018

## 2018-11-28 NOTE — Progress Notes (Addendum)
Pt presents for webex visit. Pt presents for webex visit. Pt is [redacted]w[redacted]d. Her bp today is 122/70. Pt is having volume issues on her phone which made it very difficult to hear her. I advised pt to discuss any other issues with provider over her visit.  Called patient back because Dr. Jodi Mourning could not here her on the webex. Pt is still having issues with her volume. She states that she is going to call back on her sisters phone when she gets home from the store. She states that she should be back in the next 5-10 minutes which would be around 4:25 pm. Front staff informed to transfer pt to Dr. Jodi Mourning for a telehealth visit.  Patient assessed by nursing staff during this encounter. I have reviewed the chart and agree with the documentation and plan.  Baltazar Najjar, MD 11/28/2018 4:54 PM

## 2018-12-12 ENCOUNTER — Encounter: Payer: Self-pay | Admitting: Obstetrics

## 2018-12-12 ENCOUNTER — Ambulatory Visit (INDEPENDENT_AMBULATORY_CARE_PROVIDER_SITE_OTHER): Payer: Medicaid Other | Admitting: Obstetrics

## 2018-12-12 DIAGNOSIS — Z3A34 34 weeks gestation of pregnancy: Secondary | ICD-10-CM

## 2018-12-12 DIAGNOSIS — O26843 Uterine size-date discrepancy, third trimester: Secondary | ICD-10-CM | POA: Diagnosis not present

## 2018-12-12 DIAGNOSIS — O0993 Supervision of high risk pregnancy, unspecified, third trimester: Secondary | ICD-10-CM | POA: Diagnosis not present

## 2018-12-12 DIAGNOSIS — O09893 Supervision of other high risk pregnancies, third trimester: Secondary | ICD-10-CM | POA: Diagnosis not present

## 2018-12-12 DIAGNOSIS — A549 Gonococcal infection, unspecified: Secondary | ICD-10-CM | POA: Diagnosis not present

## 2018-12-12 DIAGNOSIS — O09899 Supervision of other high risk pregnancies, unspecified trimester: Secondary | ICD-10-CM

## 2018-12-12 DIAGNOSIS — O0990 Supervision of high risk pregnancy, unspecified, unspecified trimester: Secondary | ICD-10-CM

## 2018-12-12 NOTE — Progress Notes (Signed)
   TELEHEALTH VIRTUAL OBSTETRICS VISIT ENCOUNTER NOTE  I connected with Theresa Lawson on 12/12/18 at  2:45 PM EDT by telephone at home and verified that I am speaking with the correct person using two identifiers.   I discussed the limitations, risks, security and privacy concerns of performing an evaluation and management service by telephone and the availability of in person appointments. I also discussed with the patient that there may be a patient responsible charge related to this service. The patient expressed understanding and agreed to proceed.  Subjective:  Theresa Lawson is a 28 y.o. [phone] at [redacted]w[redacted]d being followed for ongoing prenatal care.  She is currently monitored for the following issues for this high-risk pregnancy and has ALLERGIC RHINITIS; ASTHMA, CHILDHOOD; HEADACHE; Hx of pyelonephritis during pregnancy; Supervision of other normal pregnancy, antepartum; and Hx of maternal gonorrhea, currently pregnant on their problem list.  Patient reports no complaints. Reports fetal movement. Denies any contractions, bleeding or leaking of fluid.   The following portions of the patient's history were reviewed and updated as appropriate: allergies, current medications, past family history, past medical history, past social history, past surgical history and problem list.   Objective:   General:  Alert, oriented and cooperative.   Mental Status: Normal mood and affect perceived. Normal judgment and thought content.  Rest of physical exam deferred due to type of encounter  Assessment and Plan:  Pregnancy: G5P3013 at [redacted]w[redacted]d 1. Supervision of high risk pregnancy, antepartum  2. Prior pregnancy complicated by PIH, antepartum - not taking Baby ASA  3. Size of fetus inconsistent with dates in third trimester - watch fundal height  4. Gonorrhea, treated and has negative TOC   Preterm labor symptoms and general obstetric precautions including but not limited to vaginal bleeding,  contractions, leaking of fluid and fetal movement were reviewed in detail with the patient.  I discussed the assessment and treatment plan with the patient. The patient was provided an opportunity to ask questions and all were answered. The patient agreed with the plan and demonstrated an understanding of the instructions. The patient was advised to call back or seek an in-person office evaluation/go to [facility]U at [facility] for any urgent or concerning symptoms. Please refer to After Visit Summary for other counseling recommendations.   I provided 10 minutes of non-face-to-face time during this encounter.  Return in about 2 weeks (around 12/26/2018) for [facility] in person.  Future Appointments  Date Time Provider [place]  12/26/2018  4:15 PM Sloan Leiter, MD [place] None    Baltazar Najjar, [place] for [facility] [place] [facility] 12/12/2018

## 2018-12-12 NOTE — Progress Notes (Signed)
[place] visit  PR:[facility]

## 2018-12-26 ENCOUNTER — Telehealth (INDEPENDENT_AMBULATORY_CARE_PROVIDER_SITE_OTHER): Payer: Medicaid Other | Admitting: Obstetrics and Gynecology

## 2018-12-26 ENCOUNTER — Encounter: Payer: Medicaid Other | Admitting: Advanced Practice Midwife

## 2018-12-26 ENCOUNTER — Other Ambulatory Visit: Payer: Self-pay

## 2018-12-26 ENCOUNTER — Encounter: Payer: Self-pay | Admitting: Obstetrics and Gynecology

## 2018-12-26 DIAGNOSIS — Z8759 Personal history of other complications of pregnancy, childbirth and the puerperium: Secondary | ICD-10-CM | POA: Insufficient documentation

## 2018-12-26 DIAGNOSIS — Z20828 Contact with and (suspected) exposure to other viral communicable diseases: Secondary | ICD-10-CM | POA: Diagnosis not present

## 2018-12-26 DIAGNOSIS — O09893 Supervision of other high risk pregnancies, third trimester: Secondary | ICD-10-CM

## 2018-12-26 DIAGNOSIS — Z3A36 36 weeks gestation of pregnancy: Secondary | ICD-10-CM | POA: Diagnosis not present

## 2018-12-26 DIAGNOSIS — Z20822 Contact with and (suspected) exposure to covid-19: Secondary | ICD-10-CM | POA: Insufficient documentation

## 2018-12-26 DIAGNOSIS — Z3480 Encounter for supervision of other normal pregnancy, unspecified trimester: Secondary | ICD-10-CM

## 2018-12-26 DIAGNOSIS — O09293 Supervision of pregnancy with other poor reproductive or obstetric history, third trimester: Secondary | ICD-10-CM

## 2018-12-26 DIAGNOSIS — O09899 Supervision of other high risk pregnancies, unspecified trimester: Secondary | ICD-10-CM

## 2018-12-26 NOTE — Progress Notes (Signed)
TELEHEALTH VIRTUAL OBSTETRICS VISIT ENCOUNTER NOTE  I connected with [name] on 12/26/18 at  4:15 PM EDT by telephone at home and verified that I am speaking with the correct person using two identifiers.   I discussed the limitations, risks, security and privacy concerns of performing an evaluation and management service by telephone and the availability of in person appointments. I also discussed with the patient that there may be a patient responsible charge related to this service. The patient expressed understanding and agreed to proceed.  Subjective:  Theresa Lawson is a 28 y.o. [phone] at [redacted]w[redacted]d being followed for ongoing prenatal care.  She is currently monitored for the following issues for this high-risk pregnancy and has ALLERGIC RHINITIS; ASTHMA, CHILDHOOD; HEADACHE; Hx of pyelonephritis during pregnancy; Supervision of other normal pregnancy, antepartum; Hx of maternal gonorrhea, currently pregnant; History of gestational hypertension; and Close Exposure to Covid-19 Virus on their problem list.  Patient reports Braxton-Hicks. She is irritated that she was told not to come to the office, would like to be checked, (had recent COVID exposure). Her contractions after coming every 30 min or so, rates them 7/10. Reports fetal movement. Denies any contractions, bleeding or leaking of fluid.   Reports her sister tested positive for COVID-19 today. They went to the hospital because she has asthma, and felt it was acting up. They live in the same household. Sister started not feeling well 2 days ago, had low grade fever.   The following portions of the patient's history were reviewed and updated as appropriate: allergies, current medications, past family history, past medical history, past social history, past surgical history and problem list.   Objective:   LMP 04/18/2018 (Exact Date)   General:  Alert, oriented and cooperative.   Mental Status: Normal mood and affect perceived. Normal  judgment and thought content.  Rest of physical exam deferred due to type of encounter  Assessment and Plan:  Pregnancy: G5P3013 at [redacted]w[redacted]d  1. Supervision of other normal pregnancy, antepartum Will do swabs next time she is in office  2. Hx of maternal gonorrhea, currently pregnant  3. History of gestational hypertension Patient not at home, will take BP at home and put in babyscripts  4. Close Exposure to Covid-19 Virus Sister who she lives with has symptoms, tested positive 9/23, in hospital for obs overnight - reviewed importance of quarantining at home now with kids since she has been exposed - pt to go get test today - to call with results and/or symptoms - reviewed reasons to go to MAU and to call ahead if possible to alert MAU of COVID exposure - reviewed importance of quarantining from sister to prevent further transmission including going to live elsewhere if possible, sister staying at home in one room, wearing a mask if she must be around others, having someone bring her food/drink, maintaining social distance - reviewed importance of letting recent contacts know of exposure - reviewed possible pregnancy outcomes, including maternal illness requiring delivery, pre-term delivery - to go to MAU with any pregnancy issues - patient verbalizes understanding of all of the above   Preterm labor symptoms and general obstetric precautions including but not limited to vaginal bleeding, contractions, leaking of fluid and fetal movement were reviewed in detail with the patient.  I discussed the assessment and treatment plan with the patient. The patient was provided an opportunity to ask questions and all were answered. The patient agreed with the plan and demonstrated an understanding of the  instructions. The patient was advised to call back or seek an in-person office evaluation/go to MAU at [facility] for any urgent or concerning symptoms. Please refer to After Visit  Summary for other counseling recommendations.   I provided 20 minutes of non-face-to-face time during this encounter.  Return in about 1 week (around 01/02/2019) for high OB, virtual.  No future appointments.  Conan Bowens, MD [facility]

## 2018-12-26 NOTE — Progress Notes (Signed)
S/w patient for mychart visit. Pt reports fetal movement with irregular contractions. Pt was originally an "in-person" visit but had a recent Covid exposure, so switched to virtual.

## 2018-12-31 ENCOUNTER — Inpatient Hospital Stay (HOSPITAL_COMMUNITY)
Admission: AD | Admit: 2018-12-31 | Discharge: 2018-12-31 | Disposition: A | Discharge status: Home / Self Care | Payer: Medicaid Other | Attending: Obstetrics & Gynecology | Admitting: Obstetrics & Gynecology

## 2018-12-31 ENCOUNTER — Other Ambulatory Visit: Payer: Self-pay

## 2018-12-31 ENCOUNTER — Telehealth: Payer: Self-pay | Admitting: *Deleted

## 2018-12-31 ENCOUNTER — Inpatient Hospital Stay (HOSPITAL_COMMUNITY): Payer: Medicaid Other

## 2018-12-31 DIAGNOSIS — O99513 Diseases of the respiratory system complicating pregnancy, third trimester: Secondary | ICD-10-CM | POA: Diagnosis not present

## 2018-12-31 DIAGNOSIS — Z20828 Contact with and (suspected) exposure to other viral communicable diseases: Secondary | ICD-10-CM | POA: Insufficient documentation

## 2018-12-31 DIAGNOSIS — O26893 Other specified pregnancy related conditions, third trimester: Secondary | ICD-10-CM | POA: Diagnosis not present

## 2018-12-31 DIAGNOSIS — J45909 Unspecified asthma, uncomplicated: Secondary | ICD-10-CM | POA: Insufficient documentation

## 2018-12-31 DIAGNOSIS — R000 Tachycardia, unspecified: Secondary | ICD-10-CM | POA: Insufficient documentation

## 2018-12-31 DIAGNOSIS — J988 Other specified respiratory disorders: Secondary | ICD-10-CM | POA: Diagnosis not present

## 2018-12-31 DIAGNOSIS — Z87891 Personal history of nicotine dependence: Secondary | ICD-10-CM | POA: Diagnosis not present

## 2018-12-31 DIAGNOSIS — R0602 Shortness of breath: Secondary | ICD-10-CM | POA: Diagnosis not present

## 2018-12-31 DIAGNOSIS — O368130 Decreased fetal movements, third trimester, not applicable or unspecified: Secondary | ICD-10-CM | POA: Diagnosis not present

## 2018-12-31 DIAGNOSIS — Z3A36 36 weeks gestation of pregnancy: Secondary | ICD-10-CM | POA: Diagnosis not present

## 2018-12-31 DIAGNOSIS — J029 Acute pharyngitis, unspecified: Secondary | ICD-10-CM | POA: Insufficient documentation

## 2018-12-31 DIAGNOSIS — B9789 Other viral agents as the cause of diseases classified elsewhere: Secondary | ICD-10-CM

## 2018-12-31 DIAGNOSIS — O219 Vomiting of pregnancy, unspecified: Secondary | ICD-10-CM

## 2018-12-31 LAB — COMPREHENSIVE METABOLIC PANEL
ALT: 13 U/L (ref 0–44)
AST: 17 U/L (ref 15–41)
Albumin: 2.9 g/dL — ABNORMAL LOW (ref 3.5–5.0)
Alkaline Phosphatase: 138 U/L — ABNORMAL HIGH (ref 38–126)
Anion gap: 9 (ref 5–15)
BUN: 5 mg/dL — ABNORMAL LOW (ref 6–20)
CO2: 19 mmol/L — ABNORMAL LOW (ref 22–32)
Calcium: 8.9 mg/dL (ref 8.9–10.3)
Chloride: 108 mmol/L (ref 98–111)
Creatinine, Ser: 0.61 mg/dL (ref 0.44–1.00)
GFR calc Af Amer: >60 mL/min (ref >60)
GFR calc non Af Amer: >60 mL/min (ref >60)
Glucose, Bld: 104 mg/dL — ABNORMAL HIGH (ref 70–99)
Potassium: 3.8 mmol/L (ref 3.5–5.1)
Sodium: 136 mmol/L (ref 135–145)
Total Bilirubin: 0.4 mg/dL (ref 0.3–1.2)
Total Protein: 5.8 g/dL — ABNORMAL LOW (ref 6.5–8.1)

## 2018-12-31 LAB — CBC WITH DIFFERENTIAL/PLATELET
Abs Immature Granulocytes: 0.19 10*3/uL — ABNORMAL HIGH (ref 0.00–0.07)
Basophils Absolute: 0 10*3/uL (ref 0.0–0.1)
Basophils Relative: 0 %
Eosinophils Absolute: 0.1 10*3/uL (ref 0.0–0.5)
Eosinophils Relative: 2 %
HCT: 31.1 % — ABNORMAL LOW (ref 36.0–46.0)
Hemoglobin: 10.4 g/dL — ABNORMAL LOW (ref 12.0–15.0)
Immature Granulocytes: 3 %
Lymphocytes Relative: 12 %
Lymphs Abs: 0.8 10*3/uL (ref 0.7–4.0)
MCH: 29.3 pg (ref 26.0–34.0)
MCHC: 33.4 g/dL (ref 30.0–36.0)
MCV: 87.6 fL (ref 80.0–100.0)
Monocytes Absolute: 0.7 10*3/uL (ref 0.1–1.0)
Monocytes Relative: 11 %
Neutro Abs: 4.8 10*3/uL (ref 1.7–7.7)
Neutrophils Relative %: 72 %
Platelets: 171 10*3/uL (ref 150–400)
RBC: 3.55 MIL/uL — ABNORMAL LOW (ref 3.87–5.11)
RDW: 13.2 % (ref 11.5–15.5)
WBC: 6.6 10*3/uL (ref 4.0–10.5)
nRBC: 0 % (ref 0.0–0.2)

## 2018-12-31 LAB — SARS CORONAVIRUS 2 BY RT PCR (HOSPITAL ORDER, PERFORMED IN ~~LOC~~ HOSPITAL LAB): SARS Coronavirus 2: NEGATIVE

## 2018-12-31 MED ORDER — LACTATED RINGERS IV SOLN: INTRAVENOUS | Status: DC

## 2018-12-31 MED ORDER — ALBUTEROL SULFATE HFA 108 (90 BASE) MCG/ACT IN AERS: 2.0000 | INHALATION_SPRAY | RESPIRATORY_TRACT | 0 refills | Status: DC | PRN

## 2018-12-31 MED ORDER — LACTATED RINGERS IV BOLUS
1000.0000 mL | Freq: Once | INTRAVENOUS | Status: AC
  Administered 2018-12-31: 1000 mL via INTRAVENOUS

## 2018-12-31 NOTE — MAU Provider Note (Signed)
Chief Complaint:  Abdominal Pain, Shortness of Breath, Cough, Otalgia, and Sore Throat   First Provider Initiated Contact with Patient 12/31/18 1154     HPI: Theresa Lawson is a 28 y.o. [id number] at 657w6d who presents to maternity admissions reporting decreased fetal movement & respiratory symptoms. Her sister was diagnosed with Covid 19 last week & her swab that was done at the health department on Thursday is still pending. Her symptoms started 2 days ago. Symptoms include non productive cough, bilateral ear pain, SOB, sore throat, body aches, and headache. Has not had fever at home. Feels like she has mucous in her chest that she can't cough up. Is SOB all the time, nothing makes it worse. Has taken tylenol cold & flu with some improvement in symptoms. No chest pain.  Decreased movement since yesterday.  Denies contractions, leakage of fluid or vaginal bleeding.   Location: ear & throat Quality: aching, sore Severity: 5/10 in pain scale Duration: 2 days Timing: constant Modifying factors: none Associated signs and symptoms: body aches, cough, SOB    Past Medical History:  Diagnosis Date  . Asthma    childhood  . Headache(784.0)    SINCE CURRENT PREGNANCY  . Infection    YEAST INF;NOT FREQ  . Irregular periods/menstrual cycles 12/18/2011  . Polyhydramnios 2010   WAS GETTING REG NST'S DURING PREG  . Pregnancy induced hypertension 2010   NO MEDS;HAD INCREASED STESS  . Pyelonephritis    DURING AND BEFORE PREG;WAS HOSPITALIZED PREVIOUSLY  . Seasonal allergies   . Unsure LMP 12/18/2011   EDC by 4991w5d u/s at [facility]   OB History  Gravida Para Term Preterm AB Living  5 3 3  0 1 3  SAB TAB Ectopic Multiple Live Births  0 1 0 0 3    # Outcome Date GA Lbr Len/2nd Weight Sex Delivery Anes PTL Lv  5 Current           4 Term 05/14/15 4861w2d 03:41 / 00:09 3475 g M Vag-Spont EPI  LIV  3 Term 01/19/12 3080w4d / 00:26 3371 g F Vag-Spont EPI  LIV     Birth Comments: wnl  2 Term 09/21/08 8442w0d 02:00  3827 g M Vag-Spont EPI  LIV     Birth Comments: NO COMPLICATIONS  1 TAB            Past Surgical History:  Procedure Laterality Date  . NO PAST SURGERIES    . VAGINAL DELIVERY     Family History  Problem Relation Age of Onset  . Asthma Sister   . Asthma Son   . Asthma Brother        X 2  . Nephrolithiasis Mother        HAS HAD 2-3 SURGERIES TO REMOVE   Social History   Tobacco Use  . Smoking status: Former Smoker    Types: Cigarettes    Quit date: 05/20/2011    Years since quitting: 7.6  . Smokeless tobacco: Never Used  Substance Use Topics  . Alcohol use: No  . Drug use: Not Currently    Types: Marijuana   No Known Allergies Medications Prior to Admission  Medication Sig Dispense Refill Last Dose  . PE-Doxylamine-DM-GG-APAP (TYLENOL COLD & FLU DAY/NIGHT PO) Take by mouth.     . Prenatal Vit w/Fe-Methylfol-FA (PNV PO) Take by mouth.   12/30/2018 at Unknown time  . fluticasone (FLONASE) 50 MCG/ACT nasal spray Place 1-2 sprays into both nostrils daily for 7 days. 1 g  0   . ondansetron (ZOFRAN) 8 MG tablet Take 1 tablet (8 mg total) by mouth every 6 (six) hours as needed for nausea or vomiting. (Patient not taking: Reported on 12/12/2018) 20 tablet 2 More than a month at Unknown time    I have reviewed patient's Past Medical Hx, Surgical Hx, Family Hx, Social Hx, medications and allergies.   ROS:  Review of Systems  Constitutional: Negative.   HENT: Positive for congestion, ear pain, postnasal drip and sore throat. Negative for sinus pressure, sinus pain and sneezing.   Respiratory: Positive for cough and shortness of breath. Negative for chest tightness and wheezing.   Cardiovascular: Negative for chest pain.  Gastrointestinal: Positive for abdominal pain and nausea. Negative for constipation, diarrhea and vomiting.  Genitourinary: Negative.   Musculoskeletal: Positive for myalgias.  Neurological: Positive for headaches.    Physical Exam   Patient Vitals for the past  24 hrs:  BP Temp Temp src Pulse Resp SpO2  12/31/18 1300 - - - 100 - -  12/31/18 1250 - - - - - 99 %  12/31/18 1245 - - - - - 99 %  12/31/18 1240 - - - - - 100 %  12/31/18 1235 - - - - - 100 %  12/31/18 1230 - - - - - 99 %  12/31/18 1225 - - - - - 98 %  12/31/18 1220 - - - - - 99 %  12/31/18 1215 - - - 100 - 100 %  12/31/18 1210 - - - - - 100 %  12/31/18 1205 - - - - - 99 %  12/31/18 1200 - - - - - 99 %  12/31/18 1155 - - - - - 99 %  12/31/18 1150 - - - - - 99 %  12/31/18 1144 113/77 - - (!) 139 - 99 %  12/31/18 1140 - - - - - 100 %  12/31/18 1137 128/83 98.2 F (36.8 C) Oral (!) 143 20 -    Constitutional: Well-developed, well-nourished female in no acute distress.  Cardiovascular: initially tachycardic & rhythm, no murmur Respiratory: normal effort, lung sounds clear throughout GI: Abd soft, non-tender, gravid appropriate for gestational age. Pos BS x 4 MS: Extremities nontender, no edema, normal ROM Neurologic: Alert and oriented x 4.  GU:      Pelvic: NEFG, physiologic discharge, no blood, cervix clean.   NST:  Baseline: 145 bpm, Variability: Good {> 6 bpm), Accelerations: Reactive and Decelerations: Absent   Labs: Results for orders placed or performed during the hospital encounter of 12/31/18 (from the past 24 hour(s))  CBC with Differential/Platelet     Status: Abnormal   Collection Time: 12/31/18 12:10 PM  Result Value Ref Range   WBC 6.6 4.0 - 10.5 K/uL   RBC 3.55 (L) 3.87 - 5.11 MIL/uL   Hemoglobin 10.4 (L) 12.0 - 15.0 g/dL   HCT 31.1 (L) 36.0 - 46.0 %   MCV 87.6 80.0 - 100.0 fL   MCH 29.3 26.0 - 34.0 pg   MCHC 33.4 30.0 - 36.0 g/dL   RDW 13.2 11.5 - 15.5 %   Platelets 171 150 - 400 K/uL   nRBC 0.0 0.0 - 0.2 %   Neutrophils Relative % 72 %   Neutro Abs 4.8 1.7 - 7.7 K/uL   Lymphocytes Relative 12 %   Lymphs Abs 0.8 0.7 - 4.0 K/uL   Monocytes Relative 11 %   Monocytes Absolute 0.7 0.1 - 1.0 K/uL   Eosinophils Relative 2 %  Eosinophils Absolute 0.1 0.0 -  0.5 K/uL   Basophils Relative 0 %   Basophils Absolute 0.0 0.0 - 0.1 K/uL   Immature Granulocytes 3 %   Abs Immature Granulocytes 0.19 (H) 0.00 - 0.07 K/uL  Comprehensive metabolic panel     Status: Abnormal   Collection Time: 12/31/18 12:10 PM  Result Value Ref Range   Sodium 136 135 - 145 mmol/L   Potassium 3.8 3.5 - 5.1 mmol/L   Chloride 108 98 - 111 mmol/L   CO2 19 (L) 22 - 32 mmol/L   Glucose, Bld 104 (H) 70 - 99 mg/dL   BUN 5 (L) 6 - 20 mg/dL   Creatinine, Ser 0.08 0.44 - 1.00 mg/dL   Calcium 8.9 8.9 - 67.6 mg/dL   Total Protein 5.8 (L) 6.5 - 8.1 g/dL   Albumin 2.9 (L) 3.5 - 5.0 g/dL   AST 17 15 - 41 U/L   ALT 13 0 - 44 U/L   Alkaline Phosphatase 138 (H) 38 - 126 U/L   Total Bilirubin 0.4 0.3 - 1.2 mg/dL   GFR calc non Af Amer >60 >60 mL/min   GFR calc Af Amer >60 >60 mL/min   Anion gap 9 5 - 15  SARS Coronavirus 2 [facility] order, Performed in [facility] hospital lab) Nasopharyngeal Nasopharyngeal Swab     Status: None   Collection Time: 12/31/18 12:42 PM   Specimen: Nasopharyngeal Swab  Result Value Ref Range   SARS Coronavirus 2 NEGATIVE NEGATIVE    Imaging:  Dg Chest Port 1 View  Result Date: 12/31/2018 CLINICAL DATA:  Shortness of breath EXAM: PORTABLE CHEST 1 VIEW COMPARISON:  Aug 07, 2005 FINDINGS: Lungs are clear. Heart size and pulmonary vascularity are normal. No adenopathy. No bone lesions. IMPRESSION: No edema or consolidation. Electronically Signed   By: Bretta Bang III M.D.   On: 12/31/2018 12:47    MAU Course: Orders Placed This Encounter  Procedures  . SARS Coronavirus 2 [facility] order, Performed in [facility] hospital lab) Nasopharyngeal Nasopharyngeal Swab  . DG CHEST PORT 1 VIEW  . CBC with Differential/Platelet  . Comprehensive metabolic panel  . Airborne and Contact precautions  . Discharge patient   Meds ordered this encounter  Medications  . FOLLOWED BY Linked Order Group   . lactated ringers bolus 1,000 mL   . lactated ringers  infusion  . albuterol (VENTOLIN HFA) 108 (90 Base) MCG/ACT inhaler    Sig: Inhale 2 puffs into the lungs every 4 (four) hours as needed for shortness of breath.    Dispense:  8 g    Refill:  0    Order Specific Question:   Supervising Provider    Answer:   Nicholaus Bloom C [[id number]]    MDM: Fetal tracing reactive  SpO2 100%. Lung sounds clear throughout. Patient initially tachycardic which improved with IV fluids.  Chest xray negative. Covid swab negative  Assessment: 1. Viral respiratory illness   2. SOB (shortness of breath)   3. Tachycardia   4. Nausea and vomiting in pregnancy     Plan: Discharge home in stable condition.  Labor precautions and fetal kick counts Follow-up Information    [facility] Follow up.   Specialty: Obstetrics and Gynecology Why: return for worsening symptoms Contact information: [address] [id number] [place] [address] [phone]        Rx albuterol prn Discussed OTC meds for symptom management Discussed reasons to return to MAU vs ED  Allergies as of 12/31/2018   No Known Allergies     Medication List    STOP taking these medications   ondansetron 8 MG tablet Commonly known as: Zofran     TAKE these medications   albuterol 108 (90 Base) MCG/ACT inhaler Commonly known as: VENTOLIN HFA Inhale 2 puffs into the lungs every 4 (four) hours as needed for shortness of breath.   fluticasone 50 MCG/ACT nasal spray Commonly known as: FLONASE Place 1-2 sprays into both nostrils daily for 7 days.   PNV PO Take by mouth.   TYLENOL COLD & FLU DAY/NIGHT PO Take by mouth.       Judeth Horn, NP 12/31/2018 3:02 PM

## 2018-12-31 NOTE — MAU Note (Signed)
Flu like symptoms (sore throat, congestion, SOB, HA, ear aches, cough, denies fever) this is 2nd day.  Sister tested + last wk.  Pt  Was tested, still waiting on results Reports decreased FM since yesterday. Pain in lower abd, comes and goes.

## 2018-12-31 NOTE — Discharge Instructions (Signed)
Fetal Movement Counts Patient Name: ________________________________________________ Patient Due Date: ____________________ What is a fetal movement count?  A fetal movement count is the number of times that you feel your baby move during a certain amount of time. This may also be called a fetal kick count. A fetal movement count is recommended for every pregnant woman. You may be asked to start counting fetal movements as early as week 28 of your pregnancy. Pay attention to when your baby is most active. You may notice your baby's sleep and wake cycles. You may also notice things that make your baby move more. You should do a fetal movement count:  When your baby is normally most active.  At the same time each day. A good time to count movements is while you are resting, after having something to eat and drink. How do I count fetal movements? 1. Find a quiet, comfortable area. Sit, or lie down on your side. 2. Write down the date, the start time and stop time, and the number of movements that you felt between those two times. Take this information with you to your health care visits. 3. For 2 hours, count kicks, flutters, swishes, rolls, and jabs. You should feel at least 10 movements during 2 hours. 4. You may stop counting after you have felt 10 movements. 5. If you do not feel 10 movements in 2 hours, have something to eat and drink. Then, keep resting and counting for 1 hour. If you feel at least 4 movements during that hour, you may stop counting. Contact a health care provider if:  You feel fewer than 4 movements in 2 hours.  Your baby is not moving like he or she usually does. Date: ____________ Start time: ____________ Stop time: ____________ Movements: ____________ Date: ____________ Start time: ____________ Stop time: ____________ Movements: ____________ Date: ____________ Start time: ____________ Stop time: ____________ Movements: ____________ Date: ____________ Start time:  ____________ Stop time: ____________ Movements: ____________ Date: ____________ Start time: ____________ Stop time: ____________ Movements: ____________ Date: ____________ Start time: ____________ Stop time: ____________ Movements: ____________ Date: ____________ Start time: ____________ Stop time: ____________ Movements: ____________ Date: ____________ Start time: ____________ Stop time: ____________ Movements: ____________ Date: ____________ Start time: ____________ Stop time: ____________ Movements: ____________ This information is not intended to replace advice given to you by your health care provider. Make sure you discuss any questions you have with your health care provider. Document Released: 04/20/2006 Document Revised: 04/10/2018 Document Reviewed: 04/30/2015 Elsevier Patient Education  2020 [employer] Inc. Viral Respiratory Infection A respiratory infection is an illness that affects part of the respiratory system, such as the lungs, nose, or throat. A respiratory infection that is caused by a virus is called a viral respiratory infection. Common types of viral respiratory infections include:  A cold.  The flu (influenza).  A respiratory syncytial virus (RSV) infection. What are the causes? This condition is caused by a virus. What are the signs or symptoms? Symptoms of this condition include:  A stuffy or runny nose.  Yellow or green nasal discharge.  A cough.  Sneezing.  Fatigue.  Achy muscles.  A sore throat.  Sweating or chills.  A fever.  A headache. How is this diagnosed? This condition may be diagnosed based on:  Your symptoms.  A physical exam.  Testing of nasal swabs. How is this treated? This condition may be treated with medicines, such as:  Antiviral medicine. This may shorten the length of time a person has symptoms.  Expectorants. These  make it easier to cough up mucus.  Decongestant nasal sprays.  Acetaminophen or NSAIDs to relieve  fever and pain. Antibiotic medicines are not prescribed for viral infections. This is because antibiotics are designed to kill bacteria. They are not effective against viruses. Follow these instructions at home:  Managing pain and congestion  Take over-the-counter and prescription medicines only as told by your health care provider.  If you have a sore throat, gargle with a salt-water mixture 3-4 times a day or as needed. To make a salt-water mixture, completely dissolve -1 tsp of salt in 1 cup of warm water.  Use nose drops made from salt water to ease congestion and soften raw skin around your nose.  Drink enough fluid to keep your urine pale yellow. This helps prevent dehydration and helps loosen up mucus. General instructions  Rest as much as possible.  Do not drink alcohol.  Do not use any products that contain nicotine or tobacco, such as cigarettes and e-cigarettes. If you need help quitting, ask your health care provider.  Keep all follow-up visits as told by your health care provider. This is important. How is this prevented?   Get an annual flu shot. You may get the flu shot in late summer, fall, or winter. Ask your health care provider when you should get your flu shot.  Avoid exposing others to your respiratory infection. ? Stay home from work or school as told by your health care provider. ? Wash your hands with soap and water often, especially after you cough or sneeze. If soap and water are not available, use alcohol-based hand sanitizer.  Avoid contact with people who are sick during cold and flu season. This is generally fall and winter. Contact a health care provider if:  Your symptoms last for 10 days or longer.  Your symptoms get worse over time.  You have a fever.  You have severe sinus pain in your face or forehead.  The glands in your jaw or neck become very swollen. Get help right away if you:  Feel pain or pressure in your chest.  Have  shortness of breath.  Faint or feel like you will faint.  Have severe and persistent vomiting.  Feel confused or disoriented. Summary  A respiratory infection is an illness that affects part of the respiratory system, such as the lungs, nose, or throat. A respiratory infection that is caused by a virus is called a viral respiratory infection.  Common types of viral respiratory infections are a cold, influenza, and respiratory syncytial virus (RSV) infection.  Symptoms of this condition include a stuffy or runny nose, cough, sneezing, fatigue, achy muscles, sore throat, and fevers or chills.  Antibiotic medicines are not prescribed for viral infections. This is because antibiotics are designed to kill bacteria. They are not effective against viruses. This information is not intended to replace advice given to you by your health care provider. Make sure you discuss any questions you have with your health care provider. Document Released: 12/29/2004 Document Revised: 03/29/2018 Document Reviewed: 05/01/2017 Elsevier Patient Education  2020 [employer]    Safe Medications in Pregnancy   Acne: Benzoyl Peroxide Salicylic Acid  Backache/Headache: Tylenol: 2 regular strength every 4 hours OR              2 Extra strength every 6 hours  Colds/Coughs/Allergies: Benadryl (alcohol free) 25 mg every 6 hours as needed Breath right strips Claritin Cepacol throat lozenges Chloraseptic throat spray Cold-Eeze- up to three times  per day Cough drops, alcohol free Flonase (by prescription only) Guaifenesin Mucinex Robitussin DM (plain only, alcohol free) Saline nasal spray/drops Sudafed (pseudoephedrine) & Actifed ** use only after [redacted] weeks gestation and if you do not have high blood pressure Tylenol Vicks Vaporub Zinc lozenges Zyrtec   Constipation: Colace Ducolax suppositories Fleet enema Glycerin suppositories Metamucil Milk of magnesia Miralax Senokot Smooth move  tea  Diarrhea: Kaopectate Imodium A-D  *NO pepto Bismol  Hemorrhoids: Anusol Anusol HC Preparation H Tucks  Indigestion: Tums Maalox Mylanta Zantac  Pepcid  Insomnia: Benadryl (alcohol free) 25mg  every 6 hours as needed Tylenol PM Unisom, no Gelcaps  Leg Cramps: Tums MagGel  Nausea/Vomiting:  Bonine Dramamine Emetrol Ginger extract Sea bands Meclizine  Nausea medication to take during pregnancy:  Unisom (doxylamine succinate 25 mg tablets) Take one tablet daily at bedtime. If symptoms are not adequately controlled, the dose can be increased to a maximum recommended dose of two tablets daily (1/2 tablet in the morning, 1/2 tablet mid-afternoon and one at bedtime). Vitamin B6 100mg  tablets. Take one tablet twice a day (up to 200 mg per day).  Skin Rashes: Aveeno products Benadryl cream or 25mg  every 6 hours as needed Calamine Lotion 1% cortisone cream  Yeast infection: Gyne-lotrimin 7 Monistat 7  Gum/tooth pain: Anbesol  **If taking multiple medications, please check labels to avoid duplicating the same active ingredients **take medication as directed on the label ** Do not exceed 4000 mg of tylenol in 24 hours **Do not take medications that contain aspirin or ibuprofen

## 2018-12-31 NOTE — Telephone Encounter (Signed)
Will forward provider.  Derl Barrow, RN

## 2018-12-31 NOTE — Telephone Encounter (Addendum)
Tiffany with [employer] called this AM regarding elevated blood pressure reading. Patient's blood pressure read 146/80 and both retakes 146/80, patient reported no symptoms. Will send patient a Mychart message.  26 102/70 12/25/2018 10:20:21am manualBP 36 27 116/74 12/26/2018 06:33:54pm manualBP 36 28 94/54 12/29/2018 12:29:02am manualBP 36 29 94/54 12/29/2018 12:29:05am manualBP 36 30 146/80 12/29/2018 05:33:53pm manualBP 36 31 146/80 12/29/2018 05:34:05pm manualBP 36 32 146/80 12/29/2018 05:34:06pm manualBP   Derl Barrow, RN

## 2019-01-01 NOTE — Telephone Encounter (Signed)
Patient seen at MAU last night.

## 2019-01-02 ENCOUNTER — Telehealth (INDEPENDENT_AMBULATORY_CARE_PROVIDER_SITE_OTHER): Payer: Medicaid Other | Admitting: Obstetrics and Gynecology

## 2019-01-02 ENCOUNTER — Encounter: Payer: Self-pay | Admitting: Obstetrics and Gynecology

## 2019-01-02 DIAGNOSIS — Z3480 Encounter for supervision of other normal pregnancy, unspecified trimester: Secondary | ICD-10-CM

## 2019-01-02 DIAGNOSIS — O09293 Supervision of pregnancy with other poor reproductive or obstetric history, third trimester: Secondary | ICD-10-CM

## 2019-01-02 DIAGNOSIS — Z20822 Contact with and (suspected) exposure to covid-19: Secondary | ICD-10-CM

## 2019-01-02 DIAGNOSIS — Z3A37 37 weeks gestation of pregnancy: Secondary | ICD-10-CM | POA: Diagnosis not present

## 2019-01-02 DIAGNOSIS — Z20828 Contact with and (suspected) exposure to other viral communicable diseases: Secondary | ICD-10-CM | POA: Diagnosis not present

## 2019-01-02 DIAGNOSIS — Z8759 Personal history of other complications of pregnancy, childbirth and the puerperium: Secondary | ICD-10-CM

## 2019-01-02 NOTE — Progress Notes (Signed)
   TELEHEALTH VIRTUAL OBSTETRICS VISIT ENCOUNTER NOTE  I connected with [name] on 01/02/19 at  9:30 AM EDT by telephone at home and verified that I am speaking with the correct person using two identifiers.   I discussed the limitations, risks, security and privacy concerns of performing an evaluation and management service by telephone and the availability of in person appointments. I also discussed with the patient that there may be a patient responsible charge related to this service. The patient expressed understanding and agreed to proceed.  Subjective:  Theresa Lawson is a 28 y.o. [phone] at [redacted]w[redacted]d being followed for ongoing prenatal care.  She is currently monitored for the following issues for this high-risk pregnancy and has ALLERGIC RHINITIS; ASTHMA, CHILDHOOD; HEADACHE; Hx of pyelonephritis during pregnancy; Supervision of other normal pregnancy, antepartum; Hx of maternal gonorrhea, currently pregnant; History of gestational hypertension; and Close Exposure to Covid-19 Virus on their problem list.  Patient reports congestion and cough now, much improved from last week. Exposed to COVID-19 through sister who tested positive, patient started having shortness of breath, headache, body aches, stomach pain, cough, sore throat. Symptoms started on 12/30/18 and was tested 12/31/18. She is feeling much better with taking tylenol cold and flu.  Reports fetal movement. Denies any contractions, bleeding or leaking of fluid.   The following portions of the patient's history were reviewed and updated as appropriate: allergies, current medications, past family history, past medical history, past social history, past surgical history and problem list.   Objective:   LMP 05-15-2018 (Exact Date)   Battery died for BP cuff BP in MAU 2 days ago wnl  General:  Alert, oriented and cooperative.   Mental Status: Normal mood and affect perceived. Normal judgment and thought content.  Rest of physical  exam deferred due to type of encounter  Assessment and Plan:  Pregnancy: [id number] at [redacted]w[redacted]d  1. Supervision of other normal pregnancy, antepartum  2. Close Exposure to Covid-19 Virus Sister positive 12/26/18 Patient started having symptoms on 12/30/18 and tested negative 12/31/18 Okay for office visit 14 days after symptoms onset  3. History of gestational hypertension stable  Preterm labor symptoms and general obstetric precautions including but not limited to vaginal bleeding, contractions, leaking of fluid and fetal movement were reviewed in detail with the patient.  I discussed the assessment and treatment plan with the patient. The patient was provided an opportunity to ask questions and all were answered. The patient agreed with the plan and demonstrated an understanding of the instructions. The patient was advised to call back or seek an in-person office evaluation/go to [facility] at [facility] for any urgent or concerning symptoms. Please refer to After Visit Summary for other counseling recommendations.   I provided 20 minutes of non-face-to-face time during this encounter.  Return in about 1 week (around 01/09/2019) for virtual, high OB.  No future appointments.  Sloan Leiter, MD [facility] [place], [place]

## 2019-01-02 NOTE — Progress Notes (Signed)
S/w patient for mychart visit. Pt reports fetal movement, denies pain. Pt is currently taking Tylenol cold and flu, states that she feels better, still has congestion and a little cough. Pt states battery is dead on BP cuff but she will take it later today and log into [facility].

## 2019-01-08 ENCOUNTER — Telehealth: Payer: Self-pay | Admitting: *Deleted

## 2019-01-08 NOTE — Telephone Encounter (Signed)
Received a call from after hour nurse line regarding pt BP.  Attempt to contact pt. LM on VM to call office regarding blood pressure.

## 2019-01-10 ENCOUNTER — Ambulatory Visit (INDEPENDENT_AMBULATORY_CARE_PROVIDER_SITE_OTHER): Payer: Medicaid Other | Admitting: Obstetrics

## 2019-01-10 ENCOUNTER — Other Ambulatory Visit: Payer: Self-pay

## 2019-01-10 ENCOUNTER — Encounter: Payer: Self-pay | Admitting: Obstetrics

## 2019-01-10 DIAGNOSIS — Z20828 Contact with and (suspected) exposure to other viral communicable diseases: Secondary | ICD-10-CM | POA: Diagnosis not present

## 2019-01-10 DIAGNOSIS — Z8759 Personal history of other complications of pregnancy, childbirth and the puerperium: Secondary | ICD-10-CM

## 2019-01-10 DIAGNOSIS — Z20822 Contact with and (suspected) exposure to covid-19: Secondary | ICD-10-CM

## 2019-01-10 DIAGNOSIS — O0990 Supervision of high risk pregnancy, unspecified, unspecified trimester: Secondary | ICD-10-CM

## 2019-01-10 DIAGNOSIS — O0993 Supervision of high risk pregnancy, unspecified, third trimester: Secondary | ICD-10-CM

## 2019-01-10 NOTE — Progress Notes (Signed)
   TELEHEALTH VIRTUAL OBSTETRICS VISIT ENCOUNTER NOTE  I connected with Theresa Lawson on 01/10/19 at  2:45 PM EDT by telephone at home and verified that I am speaking with the correct person using two identifiers.   I discussed the limitations, risks, security and privacy concerns of performing an evaluation and management service by telephone and the availability of in person appointments. I also discussed with the patient that there may be a patient responsible charge related to this service. The patient expressed understanding and agreed to proceed.  Subjective:  Theresa Lawson is a 28 y.o. [phone] at [redacted]w[redacted]d being followed for ongoing prenatal care.  She is currently monitored for the following issues for this high-risk pregnancy and has ALLERGIC RHINITIS; ASTHMA, CHILDHOOD; HEADACHE; Hx of pyelonephritis during pregnancy; Supervision of other normal pregnancy, antepartum; Hx of maternal gonorrhea, currently pregnant; History of gestational hypertension; and Close exposure to COVID-19 virus on their problem list.  Patient reports headache, swelling of hands and face and nausea.  Has a history of gestational hypertension. Reports fetal movement. Denies any contractions, bleeding or leaking of fluid.   The following portions of the patient's history were reviewed and updated as appropriate: allergies, current medications, past family history, past medical history, past social history, past surgical history and problem list.   Objective:   General:  Alert, oriented and cooperative.   Mental Status: Normal mood and affect perceived. Normal judgment and thought content.  Rest of physical exam deferred due to type of encounter  Assessment and Plan:  Pregnancy: G5P3013 at [redacted]w[redacted]d 1. Supervision of high risk pregnancy, antepartum  2. History of gestational hypertension - taking Baby ASA  3. Close exposure to COVID-19 virus - clinically stable  Term labor symptoms and general obstetric precautions  including but not limited to vaginal bleeding, contractions, leaking of fluid and fetal movement were reviewed in detail with the patient.  I discussed the assessment and treatment plan with the patient. The patient was provided an opportunity to ask questions and all were answered. The patient agreed with the plan and demonstrated an understanding of the instructions. The patient was advised to call back or seek an in-person office evaluation/go to [facility]U at [facility] for any urgent or concerning symptoms. Please refer to After Visit Summary for other counseling recommendations.   I provided 10 minutes of non-face-to-face time during this encounter.  Return in about 1 week (around 01/17/2019) for [facility].   Baltazar Najjar, MD [facility] for [facility] [place] [facility] 01/10/2019

## 2019-01-10 NOTE — Progress Notes (Signed)
ROB Virtual Visit  B/P:113/96  Repeated B/P:132/85  Pt notes headaches and swelling.  Also complains of nausea and vomiting.

## 2019-01-18 ENCOUNTER — Ambulatory Visit (INDEPENDENT_AMBULATORY_CARE_PROVIDER_SITE_OTHER): Payer: Medicaid Other | Admitting: Family Medicine

## 2019-01-18 ENCOUNTER — Other Ambulatory Visit: Payer: Self-pay

## 2019-01-18 ENCOUNTER — Encounter: Payer: Self-pay | Admitting: Family Medicine

## 2019-01-18 ENCOUNTER — Other Ambulatory Visit (HOSPITAL_COMMUNITY)
Admission: RE | Admit: 2019-01-18 | Discharge: 2019-01-18 | Disposition: A | Discharge status: Home / Self Care | Payer: Medicaid Other | Source: Ambulatory Visit | Attending: Family Medicine | Admitting: Family Medicine

## 2019-01-18 VITALS — BP 121/83 | HR 81 | Wt 178.0 lb

## 2019-01-18 DIAGNOSIS — O0990 Supervision of high risk pregnancy, unspecified, unspecified trimester: Secondary | ICD-10-CM | POA: Diagnosis not present

## 2019-01-18 DIAGNOSIS — Z3480 Encounter for supervision of other normal pregnancy, unspecified trimester: Secondary | ICD-10-CM

## 2019-01-18 DIAGNOSIS — Z8759 Personal history of other complications of pregnancy, childbirth and the puerperium: Secondary | ICD-10-CM

## 2019-01-18 DIAGNOSIS — Z3A39 39 weeks gestation of pregnancy: Secondary | ICD-10-CM

## 2019-01-18 DIAGNOSIS — O0993 Supervision of high risk pregnancy, unspecified, third trimester: Secondary | ICD-10-CM

## 2019-01-18 NOTE — Progress Notes (Signed)
ROB  Due for GBS and Cultures.  Pt Request cervix check and membrane sweep

## 2019-01-18 NOTE — Progress Notes (Signed)
   Subjective:  Theresa Lawson is a 28 y.o. [phone] at [redacted]w[redacted]d being seen today for ongoing prenatal care.  She is currently monitored for the following issues for this low-risk pregnancy and has ALLERGIC RHINITIS; ASTHMA, CHILDHOOD; HEADACHE; Hx of pyelonephritis during pregnancy; Supervision of other normal pregnancy, antepartum; Hx of maternal gonorrhea, currently pregnant; History of gestational hypertension; and Close exposure to COVID-19 virus on their problem list.  Patient reports fatigue, no bleeding, no contractions and no leaking.  Contractions: Irritability. Vag. Bleeding: None.  Movement: Present. Denies leaking of fluid.   The following portions of the patient's history were reviewed and updated as appropriate: allergies, current medications, past family history, past medical history, past social history, past surgical history and problem list. Problem list updated.  Objective:   Vitals:   01/18/19 0935  BP: 121/83  Pulse: 81  Weight: 178 lb (80.7 kg)    Fetal Status: Fetal Heart Rate (bpm): 140 Fundal Height: 39 cm Movement: Present  Presentation: Vertex  General:  Alert, oriented and cooperative. Patient is in no acute distress.  Skin: Skin is warm and dry. No rash noted.   Cardiovascular: Normal heart rate noted  Respiratory: Normal respiratory effort, no problems with respiration noted  Abdomen: Soft, gravid, appropriate for gestational age. Pain/Pressure: Present     Pelvic: Vag. Bleeding: None     Cervical exam performed Dilation: 1 Effacement (%): Thick Station: -3  Extremities: Normal range of motion.  Edema: None  Mental Status: Normal mood and affect. Normal behavior. Normal judgment and thought content.   Urinalysis:      Assessment and Plan:  Pregnancy: [name] at [redacted]w[redacted]d  1. Supervision of high risk pregnancy, antepartum Swabs today Patient declines PD testing, would like to be induced after 40wks  Scheduled for 01/22/2019, orders placed Membranes swept at  patient request - Strep Gp B NAA - Cervicovaginal ancillary only( [place])  2. Supervision of other normal pregnancy, antepartum   3. History of gestational hypertension Taking ASA  Term labor symptoms and general obstetric precautions including but not limited to vaginal bleeding, contractions, leaking of fluid and fetal movement were reviewed in detail with the patient. Please refer to After Visit Summary for other counseling recommendations.  Return in about 6 weeks (around 03/01/2019), or pp visit.   Clarnce Flock, MD

## 2019-01-20 ENCOUNTER — Encounter (INDEPENDENT_AMBULATORY_CARE_PROVIDER_SITE_OTHER): Payer: Self-pay

## 2019-01-20 ENCOUNTER — Other Ambulatory Visit: Payer: Self-pay | Admitting: Family Medicine

## 2019-01-20 DIAGNOSIS — Z3A39 39 weeks gestation of pregnancy: Secondary | ICD-10-CM

## 2019-01-20 LAB — STREP GP B NAA: Strep Gp B NAA: POSITIVE — AB

## 2019-01-21 ENCOUNTER — Telehealth (HOSPITAL_COMMUNITY): Payer: Self-pay | Admitting: *Deleted

## 2019-01-21 ENCOUNTER — Other Ambulatory Visit (HOSPITAL_COMMUNITY): Admission: RE | Admit: 2019-01-21 | Payer: Medicaid Other | Source: Ambulatory Visit

## 2019-01-21 ENCOUNTER — Encounter (INDEPENDENT_AMBULATORY_CARE_PROVIDER_SITE_OTHER): Payer: Self-pay

## 2019-01-21 NOTE — Telephone Encounter (Signed)
Preadmission screen  

## 2019-01-22 ENCOUNTER — Inpatient Hospital Stay (HOSPITAL_COMMUNITY)
Admission: AD | Admit: 2019-01-22 | Discharge: 2019-01-24 | DRG: 807 | Disposition: A | Discharge status: Home / Self Care | Payer: Medicaid Other | Attending: Family Medicine | Admitting: Family Medicine

## 2019-01-22 ENCOUNTER — Inpatient Hospital Stay (HOSPITAL_COMMUNITY): Payer: Medicaid Other | Admitting: Anesthesiology

## 2019-01-22 ENCOUNTER — Encounter (INDEPENDENT_AMBULATORY_CARE_PROVIDER_SITE_OTHER): Payer: Self-pay

## 2019-01-22 ENCOUNTER — Inpatient Hospital Stay (HOSPITAL_COMMUNITY): Payer: Medicaid Other

## 2019-01-22 ENCOUNTER — Encounter (HOSPITAL_COMMUNITY): Payer: Self-pay | Admitting: General Practice

## 2019-01-22 ENCOUNTER — Other Ambulatory Visit: Payer: Self-pay

## 2019-01-22 DIAGNOSIS — O99824 Streptococcus B carrier state complicating childbirth: Secondary | ICD-10-CM | POA: Diagnosis present

## 2019-01-22 DIAGNOSIS — Z30017 Encounter for initial prescription of implantable subdermal contraceptive: Secondary | ICD-10-CM | POA: Diagnosis not present

## 2019-01-22 DIAGNOSIS — Z3490 Encounter for supervision of normal pregnancy, unspecified, unspecified trimester: Secondary | ICD-10-CM | POA: Diagnosis present

## 2019-01-22 DIAGNOSIS — Z3009 Encounter for other general counseling and advice on contraception: Secondary | ICD-10-CM | POA: Diagnosis present

## 2019-01-22 DIAGNOSIS — O26893 Other specified pregnancy related conditions, third trimester: Secondary | ICD-10-CM | POA: Diagnosis present

## 2019-01-22 DIAGNOSIS — O480 Post-term pregnancy: Secondary | ICD-10-CM | POA: Diagnosis present

## 2019-01-22 DIAGNOSIS — Z87891 Personal history of nicotine dependence: Secondary | ICD-10-CM | POA: Diagnosis not present

## 2019-01-22 DIAGNOSIS — Z23 Encounter for immunization: Secondary | ICD-10-CM

## 2019-01-22 DIAGNOSIS — Z3A40 40 weeks gestation of pregnancy: Secondary | ICD-10-CM | POA: Diagnosis not present

## 2019-01-22 DIAGNOSIS — Z20828 Contact with and (suspected) exposure to other viral communicable diseases: Secondary | ICD-10-CM | POA: Diagnosis present

## 2019-01-22 LAB — SARS CORONAVIRUS 2 BY RT PCR (HOSPITAL ORDER, PERFORMED IN ~~LOC~~ HOSPITAL LAB): SARS Coronavirus 2: NEGATIVE

## 2019-01-22 LAB — ABO/RH: ABO/RH(D): O POS

## 2019-01-22 LAB — CBC
HCT: 31.2 % — ABNORMAL LOW (ref 36.0–46.0)
Hemoglobin: 10.1 g/dL — ABNORMAL LOW (ref 12.0–15.0)
MCH: 28.6 pg (ref 26.0–34.0)
MCHC: 32.4 g/dL (ref 30.0–36.0)
MCV: 88.4 fL (ref 80.0–100.0)
Platelets: 200 10*3/uL (ref 150–400)
RBC: 3.53 MIL/uL — ABNORMAL LOW (ref 3.87–5.11)
RDW: 13.4 % (ref 11.5–15.5)
WBC: 8 10*3/uL (ref 4.0–10.5)
nRBC: 0 % (ref 0.0–0.2)

## 2019-01-22 LAB — TYPE AND SCREEN
ABO/RH(D): O POS
Antibody Screen: NEGATIVE

## 2019-01-22 LAB — RPR: RPR Ser Ql: NONREACTIVE

## 2019-01-22 MED ORDER — ONDANSETRON HCL 4 MG/2ML IJ SOLN
4.0000 mg | Freq: Four times a day (QID) | INTRAMUSCULAR | Status: DC | PRN
  Administered 2019-01-22: 4 mg via INTRAVENOUS
  Filled 2019-01-22: qty 2

## 2019-01-22 MED ORDER — PHENYLEPHRINE 40 MCG/ML (10ML) SYRINGE FOR IV PUSH (FOR BLOOD PRESSURE SUPPORT): 80.0000 ug | PREFILLED_SYRINGE | INTRAVENOUS | Status: DC | PRN

## 2019-01-22 MED ORDER — SODIUM CHLORIDE (PF) 0.9 % IJ SOLN
INTRAMUSCULAR | Status: DC | PRN
  Administered 2019-01-22: 12 mL/h via EPIDURAL

## 2019-01-22 MED ORDER — PROMETHAZINE HCL 25 MG/ML IJ SOLN
12.5000 mg | Freq: Once | INTRAMUSCULAR | Status: AC
  Administered 2019-01-22: 12.5 mg via INTRAVENOUS
  Filled 2019-01-22: qty 1

## 2019-01-22 MED ORDER — LACTATED RINGERS IV SOLN
INTRAVENOUS | Status: DC
  Administered 2019-01-22: 08:00:00 via INTRAVENOUS

## 2019-01-22 MED ORDER — INFLUENZA VAC SPLIT QUAD 0.5 ML IM SUSY
0.5000 mL | PREFILLED_SYRINGE | INTRAMUSCULAR | Status: AC
  Administered 2019-01-23: 0.5 mL via INTRAMUSCULAR
  Filled 2019-01-22: qty 0.5

## 2019-01-22 MED ORDER — OXYCODONE HCL 5 MG PO TABS
5.0000 mg | ORAL_TABLET | ORAL | Status: DC | PRN
  Administered 2019-01-23 (×3): 5 mg via ORAL
  Filled 2019-01-22 (×3): qty 1

## 2019-01-22 MED ORDER — COCONUT OIL OIL: 1.0000 "application " | TOPICAL_OIL | Status: DC | PRN

## 2019-01-22 MED ORDER — EPHEDRINE 5 MG/ML INJ: 10.0000 mg | INTRAVENOUS | Status: DC | PRN

## 2019-01-22 MED ORDER — OXYCODONE HCL 5 MG PO TABS
10.0000 mg | ORAL_TABLET | ORAL | Status: DC | PRN
  Administered 2019-01-23 – 2019-01-24 (×2): 10 mg via ORAL
  Filled 2019-01-22 (×2): qty 2

## 2019-01-22 MED ORDER — SIMETHICONE 80 MG PO CHEW: 80.0000 mg | CHEWABLE_TABLET | ORAL | Status: DC | PRN

## 2019-01-22 MED ORDER — TERBUTALINE SULFATE 1 MG/ML IJ SOLN: 0.2500 mg | Freq: Once | INTRAMUSCULAR | Status: DC | PRN

## 2019-01-22 MED ORDER — LACTATED RINGERS IV SOLN: 500.0000 mL | INTRAVENOUS | Status: DC | PRN

## 2019-01-22 MED ORDER — IBUPROFEN 600 MG PO TABS
600.0000 mg | ORAL_TABLET | Freq: Four times a day (QID) | ORAL | Status: DC
  Administered 2019-01-22 – 2019-01-24 (×7): 600 mg via ORAL
  Filled 2019-01-22 (×7): qty 1

## 2019-01-22 MED ORDER — PENICILLIN G 3 MILLION UNITS IVPB - SIMPLE MED
3.0000 10*6.[IU] | INTRAVENOUS | Status: DC
  Administered 2019-01-22 (×2): 3 10*6.[IU] via INTRAVENOUS
  Filled 2019-01-22 (×2): qty 100

## 2019-01-22 MED ORDER — MISOPROSTOL 50MCG HALF TABLET
50.0000 ug | ORAL_TABLET | ORAL | Status: DC | PRN
  Administered 2019-01-22: 50 ug via ORAL
  Filled 2019-01-22: qty 1

## 2019-01-22 MED ORDER — OXYTOCIN 40 UNITS IN NORMAL SALINE INFUSION - SIMPLE MED
1.0000 m[IU]/min | INTRAVENOUS | Status: DC
  Administered 2019-01-22: 2 m[IU]/min via INTRAVENOUS

## 2019-01-22 MED ORDER — ONDANSETRON HCL 4 MG PO TABS: 4.0000 mg | ORAL_TABLET | ORAL | Status: DC | PRN

## 2019-01-22 MED ORDER — TETANUS-DIPHTH-ACELL PERTUSSIS 5-2.5-18.5 LF-MCG/0.5 IM SUSP
0.5000 mL | Freq: Once | INTRAMUSCULAR | Status: AC
  Administered 2019-01-23: 0.5 mL via INTRAMUSCULAR
  Filled 2019-01-22: qty 0.5

## 2019-01-22 MED ORDER — OXYTOCIN 40 UNITS IN NORMAL SALINE INFUSION - SIMPLE MED
2.5000 [IU]/h | INTRAVENOUS | Status: DC
  Filled 2019-01-22: qty 1000

## 2019-01-22 MED ORDER — SOD CITRATE-CITRIC ACID 500-334 MG/5ML PO SOLN
30.0000 mL | ORAL | Status: DC | PRN
  Administered 2019-01-22: 30 mL via ORAL
  Filled 2019-01-22: qty 30

## 2019-01-22 MED ORDER — BENZOCAINE-MENTHOL 20-0.5 % EX AERO
1.0000 "application " | INHALATION_SPRAY | CUTANEOUS | Status: DC | PRN
  Administered 2019-01-22: 1 via TOPICAL
  Filled 2019-01-22: qty 56

## 2019-01-22 MED ORDER — SENNOSIDES-DOCUSATE SODIUM 8.6-50 MG PO TABS
2.0000 | ORAL_TABLET | ORAL | Status: DC
  Administered 2019-01-23 – 2019-01-24 (×2): 2 via ORAL
  Filled 2019-01-22 (×2): qty 2

## 2019-01-22 MED ORDER — ACETAMINOPHEN 325 MG PO TABS: 650.0000 mg | ORAL_TABLET | ORAL | Status: DC | PRN

## 2019-01-22 MED ORDER — ONDANSETRON HCL 4 MG/2ML IJ SOLN: 4.0000 mg | INTRAMUSCULAR | Status: DC | PRN

## 2019-01-22 MED ORDER — LACTATED RINGERS IV SOLN: 500.0000 mL | Freq: Once | INTRAVENOUS | Status: DC

## 2019-01-22 MED ORDER — PRENATAL MULTIVITAMIN CH
1.0000 | ORAL_TABLET | Freq: Every day | ORAL | Status: DC
  Administered 2019-01-23 – 2019-01-24 (×2): 1 via ORAL
  Filled 2019-01-22 (×2): qty 1

## 2019-01-22 MED ORDER — SODIUM CHLORIDE 0.9 % IV SOLN
5.0000 10*6.[IU] | Freq: Once | INTRAVENOUS | Status: AC
  Administered 2019-01-22: 5 10*6.[IU] via INTRAVENOUS
  Filled 2019-01-22: qty 5

## 2019-01-22 MED ORDER — LIDOCAINE HCL (PF) 1 % IJ SOLN: 30.0000 mL | INTRAMUSCULAR | Status: DC | PRN

## 2019-01-22 MED ORDER — LIDOCAINE HCL (PF) 1 % IJ SOLN
INTRAMUSCULAR | Status: DC | PRN
  Administered 2019-01-22: 10 mL via EPIDURAL

## 2019-01-22 MED ORDER — FENTANYL CITRATE (PF) 100 MCG/2ML IJ SOLN: 50.0000 ug | INTRAMUSCULAR | Status: DC | PRN

## 2019-01-22 MED ORDER — ZOLPIDEM TARTRATE 5 MG PO TABS: 5.0000 mg | ORAL_TABLET | Freq: Every evening | ORAL | Status: DC | PRN

## 2019-01-22 MED ORDER — FENTANYL-BUPIVACAINE-NACL 0.5-0.125-0.9 MG/250ML-% EP SOLN
12.0000 mL/h | EPIDURAL | Status: DC | PRN
  Filled 2019-01-22: qty 250

## 2019-01-22 MED ORDER — DIPHENHYDRAMINE HCL 50 MG/ML IJ SOLN: 12.5000 mg | INTRAMUSCULAR | Status: DC | PRN

## 2019-01-22 MED ORDER — OXYTOCIN BOLUS FROM INFUSION
500.0000 mL | Freq: Once | INTRAVENOUS | Status: AC
  Administered 2019-01-22: 500 mL via INTRAVENOUS

## 2019-01-22 MED ORDER — DIPHENHYDRAMINE HCL 25 MG PO CAPS: 25.0000 mg | ORAL_CAPSULE | Freq: Four times a day (QID) | ORAL | Status: DC | PRN

## 2019-01-22 MED ORDER — WITCH HAZEL-GLYCERIN EX PADS: 1.0000 "application " | MEDICATED_PAD | CUTANEOUS | Status: DC | PRN

## 2019-01-22 MED ORDER — DIBUCAINE (PERIANAL) 1 % EX OINT: 1.0000 "application " | TOPICAL_OINTMENT | CUTANEOUS | Status: DC | PRN

## 2019-01-22 NOTE — Progress Notes (Signed)
Patient ID: Theresa Lawson, female   DOB: [birth year], 28 y.o.   MRN: [id number]  Patient comfortable with epidural.   BP 101/64   Pulse (!) 102   Temp 98 F (36.7 C) (Oral)   Resp 18   Ht 5\' 4"  (1.626 m)   Wt 82.1 kg   LMP 04/18/2018 (Exact Date)   SpO2 100%   BMI 31.05 kg/m   Dilation: 6 Effacement (%): 80 Cervical Position: Posterior Station: -2 Presentation: Vertex Exam by:: Naven Giambalvo, DO  AROM with clear fluid IUPC placed.  Continue ABX for GBS prophylaxis  Truett Mainland, DO

## 2019-01-22 NOTE — H&P (Addendum)
OBSTETRIC ADMISSION HISTORY AND PHYSICAL  Theresa Lawson is a 28 y.o. female [phone] [id number] with IUP at 4229w0d presenting for IOL. She has no contractions. She reports +FMs. No LOF, VB, blurry vision, headaches, peripheral edema, or RUQ pain. She plans on breast and bottle feeding. She requests Nexplanon for birth control.  Dating: By LMP --->  Estimated Date of Delivery: 01/22/19  Sono:   @19  weeks, CWD, posterior placenta, normal anatomy, EFW 50%, female gender  Prenatal History/Complications: GHTN, on ASA  Nursing Staff Provider  Office Location  [facility] Dating  LMP   Language   ENGLISH Anatomy US  wnl female  Flu Vaccine  Declined  Genetic Screen  NIPS: low risk female   AFP:     TDaP vaccine   Declined Hgb A1C or  GTT Third trimester  Glucose, Fasting 65 - 91 mg/dL 75   Glucose, 1 hour 65 - 179 mg/dL [id number]   Glucose, 2 hour 65 - 152 mg/dL 99   Resulting Agency       [facility]     LAB RESULTS   Feeding Plan Both  Blood Type O/Positive/-- (05/11 1108)   Contraception Nexplanon Antibody Negative (05/11 1108)  Circumcision Girl  Rubella 6.87 (05/11 1108)  Pediatrician  Undecided 12-26-18 RPR Non Reactive (07/29 1026)   Support Person Sister  HBsAg Negative (05/11 1108)   Prenatal Classes No  HIV Non Reactive (07/29 1026)  BTL Consent  GBS  (For PCN allergy, check sensitivities)   VBAC Consent  Pap  needs PP     Hgb Electro      CF     SMA     Waterbirth  [ ]  Class [ ]  Consent [ ]  CNM visit    Past Medical History: Past Medical History:  Diagnosis Date  . Asthma    childhood  . Headache(784.0)    SINCE CURRENT PREGNANCY  . Infection    YEAST INF;NOT FREQ  . Irregular periods/menstrual cycles 12/18/2011  . Polyhydramnios 2010   WAS GETTING REG NST'S DURING PREG  . Pregnancy induced hypertension 2010   NO MEDS;HAD INCREASED STESS  . Pyelonephritis    DURING AND BEFORE PREG;WAS HOSPITALIZED PREVIOUSLY  . Seasonal allergies   . Unsure LMP 12/18/2011   [facility] 2052[facility]d [facility]     Past Surgical History: Past Surgical History:  Procedure Laterality Date  . NO PAST SURGERIES    . VAGINAL DELIVERY      Obstetrical History: OB History    Gravida  5   Para  3   Term  3   Preterm  0   AB  1   Living  3     SAB  0   TAB  1   Ectopic  0   Multiple  0   Live Births  3           Social History: Social History   Socioeconomic History  . Marital status: Single    Spouse name: Not on file  . Number of children: 1  . Years of education: 2511  . Highest education level: Not on file  Occupational History  . Occupation: Conservation officer, natureCASHIER    Comment: [employer]  Social Needs  . Financial resource strain: Not on file  . Food insecurity    Worry: Not on file    Inability: Not on file  . Transportation needs    Medical: Not on file    Non-medical: Not on file  Tobacco  Use  . Smoking status: Former Smoker    Types: Cigarettes    Quit date: 05/20/2011    Years since quitting: 7.6  . Smokeless tobacco: Never Used  Substance and Sexual Activity  . Alcohol use: No  . Drug use: Not Currently    Types: Marijuana  . Sexual activity: Yes    Partners: Male    Birth control/protection: None  Lifestyle  . Physical activity    Days per week: Not on file    Minutes per session: Not on file  . Stress: Not on file  Relationships  . Social Musician on phone: Not on file    Gets together: Not on file    Attends religious service: Not on file    Active member of club or organization: Not on file    Attends meetings of clubs or organizations: Not on file    Relationship status: Not on file  Other Topics Concern  . Not on file  Social History Narrative  . Not on file    Family History: Family History  Problem Relation Age of Onset  . Asthma Sister   . Asthma Son   . Asthma Brother        X 2  . Nephrolithiasis Mother        HAS HAD 2-3 SURGERIES TO REMOVE    Allergies: No Known Allergies  Medications Prior to  Admission  Medication Sig Dispense Refill Last Dose  . albuterol (VENTOLIN HFA) 108 (90 Base) MCG/ACT inhaler Inhale 2 puffs into the lungs every 4 (four) hours as needed for shortness of breath. 8 g 0   . fluticasone (FLONASE) 50 MCG/ACT nasal spray Place 1-2 sprays into both nostrils daily for 7 days. 1 g 0   . PE-Doxylamine-DM-GG-APAP (TYLENOL COLD & FLU DAY/NIGHT PO) Take by mouth.     . Prenatal Vit w/Fe-Methylfol-FA (PNV PO) Take by mouth.        Review of Systems   All systems reviewed and negative except as stated in HPI  Blood pressure 116/80, pulse 97, temperature 98 F (36.7 C), temperature source Oral, resp. rate 18, height 5\' 4"  (1.626 m), weight 82.1 kg, last menstrual period 04/18/2018, unknown if currently breastfeeding. General appearance: alert, cooperative and no distress Lungs: regular rate and effort Heart: regular rate  Abdomen: soft, non-tender Extremities: Homans sign is negative, no sign of DVT Presentation: Vertex Fetal monitoringBaseline: 145 bpm, Variability: Good {> 6 bpm) and Accelerations: Reactive Uterine activityNone Dilation: 5 Effacement (%): 50 Station: -3 Exam by:: [email or web address] RN    Prenatal labs: ABO, Rh: --/--/O POS (10/20 07-24-1972) Antibody: NEG (10/20 07-24-1972) Rubella: 6.87 (05/11 1108) RPR: Non Reactive (07/29 1026)  HBsAg: Negative (05/11 1108)  HIV: Non Reactive (07/29 1026)  GBS: --/Positive (10/16 1055)  2 hr GTT  99  Prenatal Transfer Tool  Maternal Diabetes: No Genetic Screening: Normal Maternal Ultrasounds/Referrals: Normal Fetal Ultrasounds or other Referrals:  None Maternal Substance Abuse:  No Significant Maternal Medications:  None Significant Maternal Lab Results: GBS positive  Results for orders placed or performed during the hospital encounter of 01/22/19 (from the past 24 hour(s))  SARS Coronavirus 2 by RT PCR (hospital order, performed in [facility] hospital lab) Nasopharyngeal Nasopharyngeal Swab   Collection Time:  01/22/19  6:42 AM   Specimen: Nasopharyngeal Swab  Result Value Ref Range   SARS Coronavirus 2 NEGATIVE NEGATIVE  CBC   Collection Time: 01/22/19  7:21 AM  Result Value Ref  Range   WBC 8.0 4.0 - 10.5 K/uL   RBC 3.53 (L) 3.87 - 5.11 MIL/uL   Hemoglobin 10.1 (L) 12.0 - 15.0 g/dL   HCT 31.2 (L) 36.0 - 46.0 %   MCV 88.4 80.0 - 100.0 fL   MCH 28.6 26.0 - 34.0 pg   MCHC 32.4 30.0 - 36.0 g/dL   RDW 13.4 11.5 - 15.5 %   Platelets 200 150 - 400 K/uL   nRBC 0.0 0.0 - 0.2 %  Type and screen   Collection Time: 01/22/19  7:21 AM  Result Value Ref Range   ABO/RH(D) O POS    Antibody Screen NEG    Sample Expiration      01/25/2019,2359 Performed at [facility], [place] [address], [place], [place] [address]     Patient Active Problem List   Diagnosis Date Noted  . Encounter for induction of labor 01/22/2019  . Post-dates pregnancy 01/22/2019  . History of gestational hypertension 12/26/2018  . Close exposure to COVID-19 virus 12/26/2018  . Hx of maternal gonorrhea, currently pregnant 10/31/2018  . Supervision of other normal pregnancy, antepartum 08/13/2018  . Hx of pyelonephritis during pregnancy 09/18/2011  . ALLERGIC RHINITIS 05/03/2010  . ASTHMA, CHILDHOOD 05/03/2010  . HEADACHE 05/03/2010    Assessment: Theresa Lawson is a 28 y.o. V7C5885 at [redacted]w[redacted]d here for IOL  1. Labor: No contractions currently felt. S/p FB. Bishop score 6, will give cytotec for cervical ripening. Consider AROM and pitocin as appropriate. 2. FWB: Baseline 145bpm, Variability 6-25bpm, Accelerations 15x15, Cat 1 tracing 3. Pain: Tylenol and Fentanyl PRN for pain, epidural as desired 4. GBS: Positive. PCN given   Plan: Risks and benefits of induction were reviewed, including failure of method, prolonged labor, need for further intervention, risk of cesarean.  Patient and family seem to understand these risks and wish to proceed. Options of cytotec, foley bulb, AROM, and pitocin reviewed, with use of each  discussed. She is s/p FB and will receive 50 mcg cytotec buccal.  Anticipate vaginal delivery, CS as appropriate.  Cherlynn June, PA student  GME ATTESTATION:  I saw and evaluated the patient. I agree with the findings and the plan of care as documented in the PA student's note.  Merilyn Baba, DO OB Fellow [place] for [place], DO  01/22/2019, 9:32 AM

## 2019-01-22 NOTE — Discharge Summary (Signed)
Postpartum Discharge Summary     Patient Name: Theresa Lawson DOB: [birth year] MRN: [id number]  Date of admission: 01/22/2019 Delivering Provider: Merilyn Baba   Date of discharge: 01/24/2019  Admitting diagnosis: INDUCTION Intrauterine pregnancy: [redacted]w[redacted]d    Secondary diagnosis:  Active Problems:   Encounter for induction of labor   Post-dates pregnancy  Additional problems:      Discharge diagnosis: Term Pregnancy Delivered                                                                                                Post partum procedures: Nexplanon insertion  Augmentation: AROM, Pitocin, Cytotec and Foley Balloon  Complications: None  Hospital course:  Induction of Labor With Vaginal Delivery   28y.o. yo [id number] 455w0das admitted to the hospital 01/22/2019 for induction of labor.  Indication for induction: Postdates.  Patient had an uncomplicated labor course as follows: She present and had FB placed. She had 1 dose of misoprostol and was subsequently started on pitocin. She received an epidural, was AROMed and then progressed to complete. Membrane Rupture Time/Date: 3:49 PM ,01/22/2019   Intrapartum Procedures: Episiotomy: None [1]                                         Lacerations:  Labial [10];Perineal [11]  Patient had delivery of a Viable infant.  Information for the patient's newborn:  BaCyndi, Montejano0[[id number]]Delivery Method: Vaginal, Spontaneous(Filed from Delivery Summary)    01/22/2019  Details of delivery can be found in separate delivery note.  Patient had a routine postpartum course. A nexplanon was placed on PPD#1. Patient is discharged home 01/24/19. Delivery time: 6:48 PM    Magnesium Sulfate received: No BMZ received: No Rhophylac:N/A MMR:N/A Transfusion:No  Physical exam  Vitals:   01/23/19 0614 01/23/19 1344 01/23/19 2304 01/24/19 0548  BP: 112/68 102/64 107/72 120/85  Pulse: 80 60 (!) 56 (!) 59  Resp: '18 16 18 18  ' Temp: 98 F  (36.7 C) 97.7 F (36.5 C) 97.9 F (36.6 C) 97.7 F (36.5 C)  TempSrc: Oral Oral Axillary Oral  SpO2: 99% 99%  99%  Weight:      Height:       General: alert, cooperative and no distress Lochia: appropriate Uterine Fundus: firm Incision: N/A DVT Evaluation: No evidence of DVT seen on physical exam. No significant calf/ankle edema. Labs: Lab Results  Component Value Date   WBC 8.0 01/22/2019   HGB 10.1 (L) 01/22/2019   HCT 31.2 (L) 01/22/2019   MCV 88.4 01/22/2019   PLT 200 01/22/2019   CMP Latest Ref Rng & Units 12/31/2018  Glucose 70 - 99 mg/dL 104(H)  BUN 6 - 20 mg/dL 5(L)  Creatinine 0.44 - 1.00 mg/dL 0.61  Sodium 135 - 145 mmol/L 136  Potassium 3.5 - 5.1 mmol/L 3.8  Chloride 98 - 111 mmol/L 108  CO2 22 - 32 mmol/L 19(L)  Calcium 8.9 - 10.3 mg/dL 8.9  Total Protein 6.5 -  8.1 g/dL 5.8(L)  Total Bilirubin 0.3 - 1.2 mg/dL 0.4  Alkaline Phos 38 - 126 U/L 138(H)  AST 15 - 41 U/L 17  ALT 0 - 44 U/L 13    Discharge instruction: per After Visit Summary and "Baby and Me Booklet".  After visit meds:  Allergies as of 01/24/2019   No Known Allergies     Medication List    STOP taking these medications   TYLENOL COLD & FLU DAY/NIGHT PO     TAKE these medications   acetaminophen 325 MG tablet Commonly known as: Tylenol Take 2 tablets (650 mg total) by mouth every 4 (four) hours as needed (for pain scale < 4).   albuterol 108 (90 Base) MCG/ACT inhaler Commonly known as: VENTOLIN HFA Inhale 2 puffs into the lungs every 4 (four) hours as needed for shortness of breath.   calcium carbonate 500 MG chewable tablet Commonly known as: TUMS - dosed in mg elemental calcium Chew 1-2 tablets by mouth as needed for indigestion or heartburn.   fluticasone 50 MCG/ACT nasal spray Commonly known as: FLONASE Place 1-2 sprays into both nostrils daily for 7 days.   ibuprofen 600 MG tablet Commonly known as: ADVIL Take 1 tablet (600 mg total) by mouth every 6 (six) hours.    Iron 325 (65 Fe) MG Tabs Take 1 tablet (325 mg total) by mouth every other day.   PNV PO Take by mouth.   polyethylene glycol powder 17 GM/SCOOP powder Commonly known as: GLYCOLAX/MIRALAX Take 255 g by mouth once for 1 dose.       Diet: routine diet  Activity: Advance as tolerated. Pelvic rest for 6 weeks.   Outpatient follow up:4 weeks Follow up Appt: Future Appointments  Date Time Provider [place]  01/29/2019  9:30 AM [place] None  02/19/2019  9:15 AM Constant, Vickii Chafe, MD [facility] None   Follow up Visit: Please schedule this patient for Postpartum visit in: 4 weeks with the following provider: Any provider For C/S patients schedule nurse incision check in weeks 2 weeks: no Low risk pregnancy complicated by: n/a Delivery mode:  SVD Anticipated Birth Control:  Nexplanon PP Procedures needed: BP check  Schedule Integrated [place] visit: no  Newborn Data: Live born female  Birth Weight:  3785 grams APGAR: 23, 9  Newborn Delivery   Birth date/time: 01/22/2019 18:48:00 Delivery type: Vaginal, Spontaneous      Baby Feeding: Bottle and Breast Disposition:home with mother   01/24/2019 Clarnce Flock, MD

## 2019-01-22 NOTE — Anesthesia Preprocedure Evaluation (Signed)
Anesthesia Evaluation  Patient identified by MRN, date of birth, ID band Patient awake    Reviewed: Allergy & Precautions, H&P , NPO status , Patient's Chart, lab work & pertinent test results  History of Anesthesia Complications Negative for: history of anesthetic complications  Airway Mallampati: II  TM Distance: >3 FB Neck ROM: full    Dental no notable dental hx.    Pulmonary neg pulmonary ROS, former smoker,    Pulmonary exam normal        Cardiovascular hypertension, negative cardio ROS Normal cardiovascular exam Rhythm:regular Rate:Normal     Neuro/Psych negative neurological ROS  negative psych ROS   GI/Hepatic negative GI ROS, Neg liver ROS,   Endo/Other  negative endocrine ROS  Renal/GU negative Renal ROS  negative genitourinary   Musculoskeletal negative musculoskeletal ROS (+)   Abdominal   Peds  Hematology negative hematology ROS (+)   Anesthesia Other Findings   Reproductive/Obstetrics (+) Pregnancy                             Anesthesia Physical Anesthesia Plan  ASA: II  Anesthesia Plan: Epidural   Post-op Pain Management:    Induction:   PONV Risk Score and Plan:   Airway Management Planned:   Additional Equipment:   Intra-op Plan:   Post-operative Plan:   Informed Consent: I have reviewed the patients History and Physical, chart, labs and discussed the procedure including the risks, benefits and alternatives for the proposed anesthesia with the patient or authorized representative who has indicated his/her understanding and acceptance.       Plan Discussed with:   Anesthesia Plan Comments:         Anesthesia Quick Evaluation

## 2019-01-22 NOTE — Anesthesia Procedure Notes (Signed)
Epidural Patient location during procedure: OB Start time: 01/22/2019 2:34 PM End time: 01/22/2019 2:44 PM  Staffing Anesthesiologist: Lidia Collum, MD Performed: anesthesiologist   Preanesthetic Checklist Completed: patient identified, pre-op evaluation, timeout performed, IV checked, risks and benefits discussed and monitors and equipment checked  Epidural Patient position: sitting Prep: DuraPrep Patient monitoring: heart rate, continuous pulse ox and blood pressure Approach: midline Location: L3-L4 Injection technique: LOR air  Needle:  Needle type: Tuohy  Needle gauge: 17 G Needle length: 9 cm Needle insertion depth: 6 cm Catheter type: closed end flexible Catheter size: 19 Gauge Catheter at skin depth: 11 cm Test dose: negative  Assessment Events: blood not aspirated, injection not painful, no injection resistance, negative IV test and no paresthesia  Additional Notes Reason for block:procedure for pain

## 2019-01-23 DIAGNOSIS — Z30017 Encounter for initial prescription of implantable subdermal contraceptive: Secondary | ICD-10-CM

## 2019-01-23 LAB — CERVICOVAGINAL ANCILLARY ONLY
Chlamydia: NEGATIVE
Comment: NEGATIVE
Comment: NORMAL
Neisseria Gonorrhea: NEGATIVE

## 2019-01-23 MED ORDER — LIDOCAINE HCL 1 % IJ SOLN
0.0000 mL | Freq: Once | INTRAMUSCULAR | Status: AC | PRN
  Administered 2019-01-23: 20 mL via INTRADERMAL
  Filled 2019-01-23: qty 20

## 2019-01-23 MED ORDER — ETONOGESTREL 68 MG ~~LOC~~ IMPL
68.0000 mg | DRUG_IMPLANT | Freq: Once | SUBCUTANEOUS | Status: AC
  Administered 2019-01-23: 68 mg via SUBCUTANEOUS
  Filled 2019-01-23: qty 1

## 2019-01-23 NOTE — Procedures (Signed)
Post-Placental Nexplanon Insertion Procedure Note  Patient was identified. Informed consent was signed, signed copy in chart. A time-out was performed.    The insertion site was identified 8-10 cm (3-4 inches) from the medial epicondyle of the humerus and 3-5 cm (1.25-2 inches) posterior to (below) the sulcus (groove) between the biceps and triceps muscles of the patient's left arm and marked. The site was prepped and draped in the usual sterile fashion. Pt was prepped with alcohol swab and then injected with 3 cc of 1% lidocaine. The site was prepped with betadine. Nexplanon removed form packaging,  Device confirmed in needle, then inserted full length of needle and withdrawn per handbook instructions. Provider and patient verified presence of the implant in the woman's arm by palpation. Pt insertion site was covered with adhesive bandage and pressure bandage. There was minimal blood loss. Patient tolerated procedure well.  Patient was given post procedure instructions and Nexplanon user card with expiration date. Condoms were recommended for STI prevention. Patient was asked to keep the pressure dressing on for 24 hours to minimize bruising and keep the adhesive bandage on for 3-5 days. The patient verbalized understanding of the plan of care and agrees.   Lot # [id number] Expiration Date12/06/2020

## 2019-01-23 NOTE — Lactation Note (Signed)
This note was copied from a baby's chart. Lactation Consultation Note Baby 5 hrs old.  Mom coming out of BR had finished a shower. Mom limping kinda not moving one leg well. Asked mom if she leg was still numb and she stated yes. LC mention she shouldn't be walking around until she all the feeling back. Mom states she is only going to BF for a little while then she is going to formula. Asked mom how long is a little while she stated she didn't know. Asked mom is she just going to BF to give the baby colostrum and stop once her milk comes in, mom stated no. Asked mom if she plans to BF/formula. Mom stated no but she has given a bottle all ready.  Newborn behavior, STS, I&O, breast massage, supply and demand discussed. Mom encouraged to feed baby 8-12 times/24 hours and with feeding cues.  Encouraged to call for assistance or questions. Lactation brochure given. Informed RN of mom's status.  Patient Name: Theresa Lawson [id number] Date: 01/23/2019 Reason for consult: Initial assessment   Maternal Data Does the patient have breastfeeding experience prior to this delivery?: Yes  Feeding    LATCH Score                   Interventions Interventions: Breast feeding basics reviewed  Lactation Tools Discussed/Used WIC Program: No   Consult Status Consult Status: Follow-up Date: 01/24/19 Follow-up type: In-patient    Terrell Shimko, Elta [place] 01/23/2019, 12:10 AM

## 2019-01-23 NOTE — Progress Notes (Signed)
Patient got up on her own and took a shower after RN told her several times not to get up on her own. Patient left leg still tingles from epidural and patient did not have IV access wrapped to protect from water. Explained to patient that she could have fallen and it is not safe for her to get up without assistance at this time. Patient apologized and verbalized understanding. Patient agreed to call for help the next time she gets up since her left leg is still heavy.

## 2019-01-23 NOTE — Anesthesia Postprocedure Evaluation (Signed)
Anesthesia Post Note  Patient: Theresa Lawson  Procedure(s) Performed: AN AD HOC LABOR EPIDURAL     Patient location during evaluation: Mother Baby Anesthesia Type: Epidural Level of consciousness: awake, awake and alert and oriented Pain management: pain level controlled Vital Signs Assessment: post-procedure vital signs reviewed and stable Respiratory status: spontaneous breathing and respiratory function stable Cardiovascular status: blood pressure returned to baseline Postop Assessment: no headache, epidural receding, patient able to bend at knees, adequate PO intake, no backache, no apparent nausea or vomiting and able to ambulate Anesthetic complications: no    Last Vitals:  Vitals:   01/23/19 0200 01/23/19 0614  BP: 122/83 112/68  Pulse: 75 80  Resp: 18 18  Temp: 36.9 C 36.7 C  SpO2: 100% 99%    Last Pain:  Vitals:   01/23/19 0614  TempSrc: Oral  PainSc: 8    Pain Goal:                   Bufford Spikes

## 2019-01-23 NOTE — Progress Notes (Signed)
POSTPARTUM PROGRESS NOTE  Subjective: Theresa Lawson is a 28 y.o. H1T0569 s/p vaginal delivery at [redacted]w[redacted]d.  She reports she doing well. No acute events overnight. She denies any problems with ambulating, voiding or po intake. Denies nausea or vomiting. She has passed flatus. Pain is well controlled.  Lochia is appropriate.  Objective: Blood pressure 112/68, pulse 80, temperature 98 F (36.7 C), temperature source Oral, resp. rate 18, height 5\' 4"  (1.626 m), weight 82.1 kg, last menstrual period 04/18/2018, SpO2 99 %, unknown if currently breastfeeding.  Physical Exam:  General: alert, cooperative and no distress Chest: no respiratory distress Abdomen: soft, non-tender  Uterine Fundus: firm, appropriately tender Extremities: No calf swelling or tenderness  no edema  Recent Labs    01/22/19 0721  HGB 10.1*  HCT 31.2*    Assessment/Plan: Theresa Lawson is a 28 y.o. V9Y8016 s/p vaginal delivery at [redacted]w[redacted]d for induction 2/2 PD.  Routine Postpartum Care: Doing well, pain well-controlled.  -- Continue routine care, lactation support  -- Contraception: Nexplanon, will place today -- Feeding: Both  Dispo: Plan for discharge tomorrow.  Merilyn Baba, DO OB/GYN Fellow, [facility] for [facility]

## 2019-01-24 MED ORDER — POLYETHYLENE GLYCOL 3350 17 GM/SCOOP PO POWD: 1.0000 | Freq: Once | ORAL | 0 refills | Status: AC

## 2019-01-24 MED ORDER — ACETAMINOPHEN 325 MG PO TABS: 650.0000 mg | ORAL_TABLET | ORAL | 0 refills | Status: DC | PRN

## 2019-01-24 MED ORDER — IBUPROFEN 600 MG PO TABS: 600.0000 mg | ORAL_TABLET | Freq: Four times a day (QID) | ORAL | 0 refills | Status: DC

## 2019-01-24 MED ORDER — IRON 325 (65 FE) MG PO TABS: 1.0000 | ORAL_TABLET | ORAL | 1 refills | Status: DC

## 2019-01-24 NOTE — Lactation Note (Signed)
This note was copied from a baby's chart. Lactation Consultation Note  Patient Name: Theresa Lawson [id number] Date: 01/24/2019 Reason for consult: Follow-up assessment  LC Follow Up Visit:  Mother is no longer breast feeding baby.  She had no questions/concerns for me at this time.  She has our OP phone number for questions after discharge.  Briefly discussed engorgement prevention, however, not likely to happen since baby has not been breast feeding.     Maternal Data    Feeding Feeding Type: Bottle Fed - Formula Nipple Type: Regular  LATCH Score                   Interventions    Lactation Tools Discussed/Used     Consult Status Consult Status: Complete Date: 01/24/19 Follow-up type: Call as needed    Alexus Michael R Dreux Mcgroarty 01/24/2019, 9:07 AM

## 2019-01-24 NOTE — Discharge Instructions (Signed)

## 2019-01-29 ENCOUNTER — Ambulatory Visit (INDEPENDENT_AMBULATORY_CARE_PROVIDER_SITE_OTHER): Payer: Medicaid Other

## 2019-01-29 ENCOUNTER — Other Ambulatory Visit: Payer: Self-pay

## 2019-01-29 VITALS — BP 150/102 | HR 58

## 2019-01-29 DIAGNOSIS — Z0130 Encounter for examination of blood pressure without abnormal findings: Secondary | ICD-10-CM

## 2019-01-29 NOTE — Progress Notes (Signed)
Pt did a telephone BP check.  Pt first BP reading was 139/88.  After having pt sit for about 5 min for a recheck the pressure was 150/102 in each arm.  Pt denies headache, dizziness, and blurred vision. Spoke with Dr. Jodi Mourning. Pt is to check bp 3x a week.  If bp is above 150/90 then pt should be seen in office.  Pt verbalized understanding. -EH/RMA

## 2019-02-19 ENCOUNTER — Other Ambulatory Visit: Payer: Self-pay

## 2019-02-19 ENCOUNTER — Ambulatory Visit (INDEPENDENT_AMBULATORY_CARE_PROVIDER_SITE_OTHER): Payer: Medicaid Other | Admitting: Obstetrics and Gynecology

## 2019-02-19 ENCOUNTER — Encounter: Payer: Self-pay | Admitting: Obstetrics and Gynecology

## 2019-02-19 DIAGNOSIS — Z1389 Encounter for screening for other disorder: Secondary | ICD-10-CM | POA: Diagnosis not present

## 2019-02-19 NOTE — Progress Notes (Signed)
..  Post Partum Exam  Theresa Lawson is a 28 y.o. H4V4259 female who presents for a postpartum visit. She is 4 weeks postpartum following a spontaneous vaginal delivery. I have fully reviewed the prenatal and intrapartum course. The delivery was at 40.0 gestational weeks.  Anesthesia: epidural. Postpartum course has been good. Baby's course has been good. Baby is feeding by bottle Dory Horn. Bleeding no bleeding. Bowel function is normal. Bladder function is normal. Patient is not sexually active. Contraception method is Nexplanon. Postpartum depression screening:neg    Review of Systems Pertinent items are noted in HPI.    Objective:  Blood pressure 130/84, pulse 66, height 5\' 4"  (1.626 m), weight 161 lb (73 kg), last menstrual period 04/18/2018, not currently breastfeeding.  General:  alert, cooperative and no distress   Breasts:  inspection negative, no nipple discharge or bleeding, no masses or nodularity palpable  Lungs: clear to auscultation bilaterally  Heart:  regular rate and rhythm  Abdomen: soft, non-tender; bowel sounds normal; no masses,  no organomegaly  Rectal Exam: Not performed.        Assessment:    Normal postpartum exam. Pap smear not done at today's visit.   Plan:   1. Contraception: Nexplanon 2. Patient is medically cleared to resume all activities of daily living and plans to return to work next week 3. Follow up in: 3 months for annual exam with pap smear or as needed.

## 2019-03-06 ENCOUNTER — Other Ambulatory Visit: Payer: Self-pay

## 2019-03-06 MED ORDER — ALBUTEROL SULFATE HFA 108 (90 BASE) MCG/ACT IN AERS: 2.0000 | INHALATION_SPRAY | RESPIRATORY_TRACT | 0 refills | Status: DC | PRN

## 2019-04-23 ENCOUNTER — Ambulatory Visit (HOSPITAL_COMMUNITY)
Admission: EM | Admit: 2019-04-23 | Discharge: 2019-04-23 | Disposition: A | Discharge status: Home / Self Care | Payer: Medicaid Other | Attending: Physician Assistant | Admitting: Physician Assistant

## 2019-04-23 ENCOUNTER — Encounter (HOSPITAL_COMMUNITY): Payer: Self-pay | Admitting: Emergency Medicine

## 2019-04-23 ENCOUNTER — Other Ambulatory Visit: Payer: Self-pay

## 2019-04-23 DIAGNOSIS — L2489 Irritant contact dermatitis due to other agents: Secondary | ICD-10-CM | POA: Diagnosis not present

## 2019-04-23 HISTORY — DX: Dermatitis, unspecified: L30.9

## 2019-04-23 MED ORDER — TRIAMCINOLONE ACETONIDE 0.1 % EX CREA: 1.0000 "application " | TOPICAL_CREAM | Freq: Three times a day (TID) | CUTANEOUS | 0 refills | Status: AC | PRN

## 2019-04-23 MED ORDER — PREDNISONE 20 MG PO TABS: ORAL_TABLET | ORAL | 0 refills | Status: AC

## 2019-04-23 NOTE — Discharge Instructions (Signed)
Take the Prednisone as prescribed for 9 days. Days 1-3 take 3 tablets, days 4-6 take 2 tablets and days 7-9 take 1 tablet. Take these with breakfast  I have also sent a steroid cream. I want you to mix this with cetaphil or eucerin cream and apply up to 3 times a day. The prescription says for 15 days but you may discontinue if the rash is improving or gone and you are feeling ok.   If the rash worsens, you develop fever or notice purulent discharge. Please come back to this clinic to be re-evaluated.

## 2019-04-23 NOTE — ED Triage Notes (Addendum)
Pt sts increased rash to back of neck and ears x 4 days, pt sts happened after getting hair dye on her ear

## 2019-04-23 NOTE — ED Provider Notes (Signed)
[facility]    CSN: [id number] Arrival date & time: 04/23/19  8676      History   Chief Complaint Chief Complaint  Patient presents with  . Rash    HPI Theresa Lawson is a 29 y.o. female.   Patient reports to urgent care today for 5 days of worsening rash on her neck and ears. She reports dying her hair about 1 week ago and noticing an itchy red rash appear on both of her ears about 2 days after. The rash then spread to the back of her neck and upper back and is spreading around the side of her neck as well. She reports the rash is itchy and her ears have begun to swell. She has noted some clear drainage from the rash on her ears. It has not effected her scalp.  She does not have any known allergies and has died her own her several times without this occurring.  She denies difficulty breathing, throat swelling or wheezing. Denies fever or chills.  She has used over the counter cortisone and other creams with minimal to no improvement.      Past Medical History:  Diagnosis Date  . Asthma    childhood  . Eczema   . Headache(784.0)    SINCE CURRENT PREGNANCY  . Infection    YEAST INF;NOT FREQ  . Irregular periods/menstrual cycles 12/18/2011  . Polyhydramnios 2010   WAS GETTING REG NST'S DURING PREG  . Pregnancy induced hypertension 2010   NO MEDS;HAD INCREASED STESS  . Pyelonephritis    DURING AND BEFORE PREG;WAS HOSPITALIZED PREVIOUSLY  . Seasonal allergies   . Unsure LMP 12/18/2011   EDC by [redacted]w[redacted]d u/s at [facility]    Patient Active Problem List   Diagnosis Date Noted  . Encounter for induction of labor 01/22/2019  . Post-dates pregnancy 01/22/2019  . History of gestational hypertension 12/26/2018  . Close exposure to COVID-19 virus 12/26/2018  . Hx of maternal gonorrhea, currently pregnant 10/31/2018  . Supervision of other normal pregnancy, antepartum 08/13/2018  . Hx of pyelonephritis during pregnancy 09/18/2011  . ALLERGIC RHINITIS 05/03/2010  . ASTHMA,  CHILDHOOD 05/03/2010  . HEADACHE 05/03/2010    Past Surgical History:  Procedure Laterality Date  . NO PAST SURGERIES    . VAGINAL DELIVERY      OB History    Gravida  5   Para  4   Term  4   Preterm  0   AB  1   Living  4     SAB  0   TAB  1   Ectopic  0   Multiple  0   Live Births  4            Home Medications    Prior to Admission medications   Medication Sig Start Date End Date Taking? Authorizing Provider  acetaminophen (TYLENOL) 325 MG tablet Take 2 tablets (650 mg total) by mouth every 4 (four) hours as needed (for pain scale < 4). Patient not taking: Reported on 01/29/2019 01/24/19   Clarnce Flock, MD  albuterol (VENTOLIN HFA) 108 (90 Base) MCG/ACT inhaler Inhale 2 puffs into the lungs every 4 (four) hours as needed for shortness of breath. 03/06/19   Shelly Bombard, MD  calcium carbonate (TUMS - DOSED IN MG ELEMENTAL CALCIUM) 500 MG chewable tablet Chew 1-2 tablets by mouth as needed for indigestion or heartburn.    [provider]  Ferrous Sulfate (IRON) 325 (65 Fe)  MG TABS Take 1 tablet (325 mg total) by mouth every other day. Patient not taking: Reported on 01/29/2019 01/24/19   Venora Maples, MD  ibuprofen (ADVIL) 600 MG tablet Take 1 tablet (600 mg total) by mouth every 6 (six) hours. Patient not taking: Reported on 01/29/2019 01/24/19   Venora Maples, MD  predniSONE (DELTASONE) 20 MG tablet Take 3 tablets (60 mg total) by mouth daily with breakfast for 3 days, THEN 2 tablets (40 mg total) daily with breakfast for 3 days, THEN 1 tablet (20 mg total) daily with breakfast for 3 days. 04/23/19 05/02/19  Senan Urey, Veryl Speak, PA-C  Prenatal Vit w/Fe-Methylfol-FA (PNV PO) Take by mouth.    [provider]  triamcinolone cream (KENALOG) 0.1 % Apply 1 application topically 3 (three) times daily as needed for up to 15 days. 04/23/19 05/08/19  Larinda Herter, Veryl Speak, PA-C  cetirizine (ZYRTEC) 10 MG tablet Take 1 tablet (10 mg total) by mouth  daily. Patient not taking: Reported on 08/16/2018 08/13/18 10/14/18  Hurshel Party, CNM    Family History Family History  Problem Relation Age of Onset  . Asthma Sister   . Asthma Son   . Asthma Brother        X 2  . Nephrolithiasis Mother        HAS HAD 2-3 SURGERIES TO REMOVE    Social History Social History   Tobacco Use  . Smoking status: Former Smoker    Types: Cigarettes    Quit date: 05/20/2011    Years since quitting: 7.9  . Smokeless tobacco: Never Used  Substance Use Topics  . Alcohol use: No  . Drug use: Not Currently    Types: Marijuana     Allergies   Patient has no known allergies.   Review of Systems Review of Systems  Constitutional: Negative for chills and fever.  HENT: Negative for congestion, ear discharge, ear pain, facial swelling, hearing loss and sore throat.   Eyes: Negative for pain, itching and visual disturbance.  Respiratory: Negative for cough, chest tightness and shortness of breath.   Cardiovascular: Negative for chest pain and palpitations.  Genitourinary: Negative for dysuria.  Musculoskeletal: Negative for arthralgias, back pain and myalgias.  Skin: Positive for color change and rash. Negative for wound.  Neurological: Negative for headaches.  All other systems reviewed and are negative.    Physical Exam Triage Vital Signs ED Triage Vitals [04/23/19 1021]  Enc Vitals Group     BP 132/68     Pulse Rate 71     Resp 18     Temp 98.3 F (36.8 C)     Temp Source Oral     SpO2 100 %     Weight      Height      Head Circumference      Peak Flow      Pain Score 8     Pain Loc      Pain Edu?      Excl. in GC?    No data found.  Updated Vital Signs BP 132/68 (BP Location: Right Arm)   Pulse 71   Temp 98.3 F (36.8 C) (Oral)   Resp 18   SpO2 100%   Visual Acuity Right Eye Distance:   Left Eye Distance:   Bilateral Distance:    Right Eye Near:   Left Eye Near:    Bilateral Near:     Physical Exam  Vitals and nursing note reviewed.  Constitutional:  General: She is not in acute distress.    Appearance: Normal appearance. She is well-developed. She is not ill-appearing.  HENT:     Head: Normocephalic and atraumatic.     Right Ear: Tympanic membrane normal.     Left Ear: Tympanic membrane normal.     Ears:     Comments: Irritation noted in external canals, consistent with rash shown in pictures    Nose: Nose normal.     Mouth/Throat:     Mouth: Mucous membranes are moist.     Pharynx: Oropharynx is clear.  Eyes:     Conjunctiva/sclera: Conjunctivae normal.     Pupils: Pupils are equal, round, and reactive to light.  Cardiovascular:     Rate and Rhythm: Normal rate and regular rhythm.     Heart sounds: No murmur.  Pulmonary:     Effort: Pulmonary effort is normal. No respiratory distress.     Breath sounds: Normal breath sounds. No wheezing, rhonchi or rales.  Abdominal:     Palpations: Abdomen is soft.     Tenderness: There is no abdominal tenderness.  Musculoskeletal:     Cervical back: Neck supple.  Skin:    General: Skin is warm and dry.     Findings: Rash (Erythematous rasied and coallescing rash as seen below in images. ) present.  Neurological:     Mental Status: She is alert.            UC Treatments / Results  Labs (all labs ordered are listed, but only abnormal results are displayed) Labs Reviewed - No data to display  EKG   Radiology No results found.  Procedures Procedures (including critical care time)  Medications Ordered in UC Medications - No data to display  Initial Impression / Assessment and Plan / UC Course  I have reviewed the triage vital signs and the nursing notes.  Pertinent labs & imaging results that were available during my care of the patient were reviewed by me and considered in my medical decision making (see chart for details).     #Contact Dermatitis - Consistent with Irritant contact dermatitis. No acute  sign of infection currently. Prednisone taper and triamcinolone cream. Return precautions discussed.    Final Clinical Impressions(s) / UC Diagnoses   Final diagnoses:  Irritant contact dermatitis due to other agents     Discharge Instructions     Take the Prednisone as prescribed for 9 days. Days 1-3 take 3 tablets, days 4-6 take 2 tablets and days 7-9 take 1 tablet. Take these with breakfast  I have also sent a steroid cream. I want you to mix this with cetaphil or eucerin cream and apply up to 3 times a day. The prescription says for 15 days but you may discontinue if the rash is improving or gone and you are feeling ok.   If the rash worsens, you develop fever or notice purulent discharge. Please come back to this clinic to be re-evaluated.       ED Prescriptions    Medication Sig Dispense Auth. Provider   predniSONE (DELTASONE) 20 MG tablet Take 3 tablets (60 mg total) by mouth daily with breakfast for 3 days, THEN 2 tablets (40 mg total) daily with breakfast for 3 days, THEN 1 tablet (20 mg total) daily with breakfast for 3 days. 18 tablet Amaurie Wandel, Veryl Speak, PA-C   triamcinolone cream (KENALOG) 0.1 % Apply 1 application topically 3 (three) times daily as needed for up to 15 days. 45 g  Chais Fehringer, Veryl Speak, PA-C     PDMP not reviewed this encounter.   Hermelinda Medicus, PA-C 04/23/19 1146

## 2019-08-10 ENCOUNTER — Emergency Department (HOSPITAL_COMMUNITY): Payer: Medicaid Other

## 2019-08-10 ENCOUNTER — Emergency Department (HOSPITAL_COMMUNITY)
Admission: EM | Admit: 2019-08-10 | Discharge: 2019-08-10 | Disposition: A | Discharge status: Home / Self Care | Payer: Medicaid Other | Attending: Emergency Medicine | Admitting: Emergency Medicine

## 2019-08-10 ENCOUNTER — Other Ambulatory Visit: Payer: Self-pay

## 2019-08-10 ENCOUNTER — Encounter (HOSPITAL_COMMUNITY): Payer: Self-pay | Admitting: Emergency Medicine

## 2019-08-10 DIAGNOSIS — Y939 Activity, unspecified: Secondary | ICD-10-CM | POA: Diagnosis not present

## 2019-08-10 DIAGNOSIS — Y92810 Car as the place of occurrence of the external cause: Secondary | ICD-10-CM | POA: Insufficient documentation

## 2019-08-10 DIAGNOSIS — Z79899 Other long term (current) drug therapy: Secondary | ICD-10-CM | POA: Diagnosis not present

## 2019-08-10 DIAGNOSIS — J45909 Unspecified asthma, uncomplicated: Secondary | ICD-10-CM | POA: Diagnosis not present

## 2019-08-10 DIAGNOSIS — Y999 Unspecified external cause status: Secondary | ICD-10-CM | POA: Insufficient documentation

## 2019-08-10 DIAGNOSIS — Z87891 Personal history of nicotine dependence: Secondary | ICD-10-CM | POA: Diagnosis not present

## 2019-08-10 DIAGNOSIS — S022XXA Fracture of nasal bones, initial encounter for closed fracture: Secondary | ICD-10-CM | POA: Insufficient documentation

## 2019-08-10 LAB — PREGNANCY, URINE: Preg Test, Ur: NEGATIVE

## 2019-08-10 MED ORDER — HYDROCODONE-ACETAMINOPHEN 5-325 MG PO TABS: 1.0000 | ORAL_TABLET | ORAL | 0 refills | Status: DC | PRN

## 2019-08-10 MED ORDER — HYDROCODONE-ACETAMINOPHEN 5-325 MG PO TABS
1.0000 | ORAL_TABLET | Freq: Once | ORAL | Status: AC
  Administered 2019-08-10: 1 via ORAL
  Filled 2019-08-10: qty 1

## 2019-08-10 NOTE — Discharge Instructions (Signed)
Ice to face for 20 minutes at a time. Take Norco as needed as prescribed for pain. Follow up with ENT, referral given.

## 2019-08-10 NOTE — ED Notes (Signed)
Pt lying in bed back from CT. [employer] at bedside for statement. NAD noted. Will continue to monitor.
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
## 2019-08-10 NOTE — ED Notes (Signed)
Pt sitting up in bed. NAD noted. Pt ready for d/c 
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
## 2019-09-04 ENCOUNTER — Ambulatory Visit (HOSPITAL_COMMUNITY)
Admission: EM | Admit: 2019-09-04 | Discharge: 2019-09-04 | Disposition: A | Discharge status: Home / Self Care | Payer: Medicaid Other | Attending: Emergency Medicine | Admitting: Emergency Medicine

## 2019-09-04 ENCOUNTER — Ambulatory Visit (INDEPENDENT_AMBULATORY_CARE_PROVIDER_SITE_OTHER): Payer: Medicaid Other

## 2019-09-04 ENCOUNTER — Other Ambulatory Visit: Payer: Self-pay

## 2019-09-04 ENCOUNTER — Encounter (HOSPITAL_COMMUNITY): Payer: Self-pay

## 2019-09-04 DIAGNOSIS — S52125A Nondisplaced fracture of head of left radius, initial encounter for closed fracture: Secondary | ICD-10-CM

## 2019-09-04 MED ORDER — IBUPROFEN 800 MG PO TABS
ORAL_TABLET | ORAL | Status: AC
  Filled 2019-09-04: qty 1

## 2019-09-04 MED ORDER — IBUPROFEN 800 MG PO TABS: 800.0000 mg | ORAL_TABLET | Freq: Three times a day (TID) | ORAL | 0 refills | Status: DC

## 2019-09-04 MED ORDER — HYDROCODONE-ACETAMINOPHEN 5-325 MG PO TABS: 1.0000 | ORAL_TABLET | Freq: Four times a day (QID) | ORAL | 0 refills | Status: DC | PRN

## 2019-09-04 MED ORDER — IBUPROFEN 800 MG PO TABS
800.0000 mg | ORAL_TABLET | Freq: Once | ORAL | Status: AC
  Administered 2019-09-04: 800 mg via ORAL
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
## 2019-09-04 NOTE — Progress Notes (Signed)
Orthopedic Tech Progress Note Patient Details:  Theresa Lawson 08-Sep-1990 [id number]  Ortho Devices Type of Ortho Device: Arm sling, Long arm splint Ortho Device/Splint Location: LUE Ortho Device/Splint Interventions: Application   Post Interventions Patient Tolerated: Well Instructions Provided: Adjustment of device, Care of device   Theresa Lawson Theresa Lawson 09/04/2019, 8:35 PM

## 2020-02-10 ENCOUNTER — Ambulatory Visit (INDEPENDENT_AMBULATORY_CARE_PROVIDER_SITE_OTHER): Payer: Medicaid Other

## 2020-02-10 DIAGNOSIS — R110 Nausea: Secondary | ICD-10-CM

## 2020-02-10 DIAGNOSIS — Z3490 Encounter for supervision of normal pregnancy, unspecified, unspecified trimester: Secondary | ICD-10-CM | POA: Insufficient documentation

## 2020-02-10 DIAGNOSIS — O210 Mild hyperemesis gravidarum: Secondary | ICD-10-CM

## 2020-02-10 DIAGNOSIS — Z3A00 Weeks of gestation of pregnancy not specified: Secondary | ICD-10-CM

## 2020-02-10 DIAGNOSIS — O26891 Other specified pregnancy related conditions, first trimester: Secondary | ICD-10-CM

## 2020-02-10 MED ORDER — PROMETHAZINE HCL 25 MG PO TABS: 25.0000 mg | ORAL_TABLET | Freq: Four times a day (QID) | ORAL | 1 refills | Status: DC | PRN

## 2020-02-10 NOTE — Progress Notes (Signed)
I connected with  Theresa Lawson on 02/10/20 by a video enabled telemedicine application and verified that I am speaking with the correct person using two identifiers.  Patient is at home Provider is at [facility]  I discussed the limitations of evaluation and management by telemedicine. The patient expressed understanding and agreed to proceed.  PRENATAL INTAKE SUMMARY  Theresa Lawson presents today New OB Nurse Interview.  OB History    Gravida  6   Para  4   Term  4   Preterm  0   AB  1   Living  4     SAB  0   TAB  1   Ectopic  0   Multiple  0   Live Births  4          I have reviewed the patient's medical, obstetrical, social, and family histories, medications, and available lab results.  SUBJECTIVE She has no unusual complaints and complains of nausea with vomiting for 21 days  OBJECTIVE Initial Nurse Intake (New OB)  GENERAL APPEARANCE: oriented to person, place and time   ASSESSMENT Normal pregnancy Nausea and vomiting PHQ-9=0  PLAN Prenatal care at [facility] OB Pnl/HIV will be done at [facility] visit. Patient already has a BP Cuff Phenergan sent to Pharmacy

## 2020-02-14 ENCOUNTER — Encounter: Payer: Medicaid Other | Admitting: Obstetrics

## 2020-03-04 ENCOUNTER — Encounter: Payer: Medicaid Other | Admitting: Obstetrics

## 2020-03-17 ENCOUNTER — Encounter: Payer: Self-pay | Admitting: Obstetrics

## 2020-03-17 ENCOUNTER — Other Ambulatory Visit (HOSPITAL_COMMUNITY)
Admission: RE | Admit: 2020-03-17 | Discharge: 2020-03-17 | Disposition: A | Discharge status: Home / Self Care | Payer: Medicaid Other | Source: Ambulatory Visit | Attending: Obstetrics | Admitting: Obstetrics

## 2020-03-17 ENCOUNTER — Ambulatory Visit (INDEPENDENT_AMBULATORY_CARE_PROVIDER_SITE_OTHER): Payer: Medicaid Other | Admitting: Obstetrics

## 2020-03-17 ENCOUNTER — Other Ambulatory Visit: Payer: Self-pay

## 2020-03-17 VITALS — BP 112/72 | HR 98 | Wt 165.4 lb

## 2020-03-17 DIAGNOSIS — Z3A16 16 weeks gestation of pregnancy: Secondary | ICD-10-CM | POA: Diagnosis not present

## 2020-03-17 DIAGNOSIS — Z3482 Encounter for supervision of other normal pregnancy, second trimester: Secondary | ICD-10-CM

## 2020-03-17 DIAGNOSIS — M549 Dorsalgia, unspecified: Secondary | ICD-10-CM

## 2020-03-17 DIAGNOSIS — O0990 Supervision of high risk pregnancy, unspecified, unspecified trimester: Secondary | ICD-10-CM | POA: Insufficient documentation

## 2020-03-17 DIAGNOSIS — Z8759 Personal history of other complications of pregnancy, childbirth and the puerperium: Secondary | ICD-10-CM

## 2020-03-17 MED ORDER — ASPIRIN 81 MG PO CHEW: 81.0000 mg | CHEWABLE_TABLET | Freq: Every day | ORAL | 5 refills | Status: DC

## 2020-03-17 MED ORDER — COMFORT FIT MATERNITY SUPP SM MISC: 0 refills | Status: DC

## 2020-03-17 NOTE — Progress Notes (Signed)
Subjective:    Theresa Lawson is being seen today for her first obstetrical visit.  This is not a planned pregnancy. She is at [redacted]w[redacted]d gestation. Her obstetrical history is significant for pregnancy induced hypertension. Relationship with FOB: significant other, living together. Patient does not intend to breast feed. Pregnancy history fully reviewed.  The information documented in the HPI was reviewed and verified.  Menstrual History: OB History    Gravida  6   Para  4   Term  4   Preterm  0   AB  1   Living  4     SAB  0   IAB  1   Ectopic  0   Multiple  0   Live Births  4            Patient's last menstrual period was 11/20/2019.    Past Medical History:  Diagnosis Date  . Asthma    childhood  . Eczema   . Headache(784.0)    SINCE CURRENT PREGNANCY  . Infection    YEAST INF;NOT FREQ  . Irregular periods/menstrual cycles 12/18/2011  . Polyhydramnios 2010   WAS GETTING REG NST'S DURING PREG  . Pregnancy induced hypertension 2010   NO MEDS;HAD INCREASED STESS  . Pyelonephritis    DURING AND BEFORE PREG;WAS HOSPITALIZED PREVIOUSLY  . Seasonal allergies   . Unsure LMP 12/18/2011   EDC by [redacted]w[redacted]d u/s at [facility]    Past Surgical History:  Procedure Laterality Date  . NO PAST SURGERIES    . VAGINAL DELIVERY      (Not in a hospital admission)  No Known Allergies  Social History   Tobacco Use  . Smoking status: Former Smoker    Types: Cigarettes    Quit date: 05/20/2011    Years since quitting: 8.8  . Smokeless tobacco: Never Used  Substance Use Topics  . Alcohol use: Not Currently    Family History  Problem Relation Age of Onset  . Asthma Sister   . Asthma Son   . Asthma Brother        X 2  . Nephrolithiasis Mother        HAS HAD 2-3 SURGERIES TO REMOVE  . Kidney disease Mother      Review of Systems Constitutional: negative for weight loss Gastrointestinal: negative for vomiting Genitourinary:negative for genital lesions and vaginal discharge  and dysuria Musculoskeletal:negative for back pain Behavioral/Psych: negative for abusive relationship, depression, illegal drug usage and tobacco use    Objective:    BP 112/72   Pulse 98   Wt 165 lb 6.4 oz (75 kg)   LMP 11/20/2019   BMI 28.39 kg/m  General Appearance:    Alert, cooperative, no distress, appears stated age  Head:    Normocephalic, without obvious abnormality, atraumatic  Eyes:    PERRL, conjunctiva/corneas clear, EOM's intact, fundi    benign, both eyes  Ears:    Normal TM's and external ear canals, both ears  Nose:   Nares normal, septum midline, mucosa normal, no drainage    or sinus tenderness  Throat:   Lips, mucosa, and tongue normal; teeth and gums normal  Neck:   Supple, symmetrical, trachea midline, no adenopathy;    thyroid:  no enlargement/tenderness/nodules; no carotid   bruit or JVD  Back:     Symmetric, no curvature, ROM normal, no CVA tenderness  Lungs:     Clear to auscultation bilaterally, respirations unlabored  Chest Wall:    No tenderness or  deformity   Heart:    Regular rate and rhythm, S1 and S2 normal, no murmur, rub   or gallop  Breast Exam:    No tenderness, masses, or nipple abnormality  Abdomen:     Soft, non-tender, bowel sounds active all four quadrants,    no masses, no organomegaly  Genitalia:    Normal female without lesion, discharge or tenderness  Extremities:   Extremities normal, atraumatic, no cyanosis or edema  Pulses:   2+ and symmetric all extremities  Skin:   Skin color, texture, turgor normal, no rashes or lesions  Lymph nodes:   Cervical, supraclavicular, and axillary nodes normal  Neurologic:   CNII-XII intact, normal strength, sensation and reflexes    throughout      Lab Review Urine pregnancy test Labs reviewed yes Radiologic studies reviewed no  Assessment:    Pregnancy at [redacted]w[redacted]d weeks    Plan:     1. Supervision of high risk pregnancy, antepartum Rx: - Cytology - PAP( [place]) - Cervicovaginal  ancillary only( [place]) - CBC/D/Plt+RPR+Rh+ABO+Rub Ab... - Genetic Screening - Culture, OB Urine - AFP, Serum, Open Spina Bifida - Flu Vaccine QUAD 36+ mos IM (Fluarix, Quad PF) - [place] MFM OB DETAIL +14 WK; Future  2. History of gestational hypertension Rx: - aspirin 81 MG chewable tablet; Chew 1 tablet (81 mg total) by mouth daily.  Dispense: 30 tablet; Refill: 5  3. Backache symptom Rx: - Elastic Bandages & Supports (COMFORT FIT MATERNITY SUPP SM) MISC; Wear as directed.  Dispense: 1 each; Refill: 0   Prenatal vitamins.  Counseling provided regarding continued use of seat belts, cessation of alcohol consumption, smoking or use of illicit drugs; infection precautions i.e., influenza/TDAP immunizations, toxoplasmosis,CMV, parvovirus, listeria and varicella; workplace safety, exercise during pregnancy; routine dental care, safe medications, sexual activity, hot tubs, saunas, pools, travel, caffeine use, fish and methlymercury, potential toxins, hair treatments, varicose veins Weight gain recommendations per IOM guidelines reviewed: underweight/BMI< 18.5--> gain 28 - 40 lbs; normal weight/BMI 18.5 - 24.9--> gain 25 - 35 lbs; overweight/BMI 25 - 29.9--> gain 15 - 25 lbs; obese/BMI >30->gain  11 - 20 lbs Problem list reviewed and updated. FIRST/CF mutation testing/NIPT/QUAD SCREEN/fragile X/Ashkenazi Jewish population testing/Spinal muscular atrophy discussed: requested. Role of ultrasound in pregnancy discussed; fetal survey: requested. Amniocentesis discussed: not indicated.   Meds ordered this encounter  Medications  . aspirin 81 MG chewable tablet    Sig: Chew 1 tablet (81 mg total) by mouth daily.    Dispense:  30 tablet    Refill:  5  . Elastic Bandages & Supports (COMFORT FIT MATERNITY SUPP SM) MISC    Sig: Wear as directed.    Dispense:  1 each    Refill:  0   Orders Placed This Encounter  Procedures  . Culture, OB Urine  . [place] MFM OB DETAIL +14 WK    Standing Status:    Future    Standing Expiration Date:   03/17/2021    Order Specific Question:   Reason for Exam (SYMPTOM  OR DIAGNOSIS REQUIRED)    Answer:   Anatomy    Order Specific Question:   Preferred Location    Answer:   [facility] Ultrasound  . Flu Vaccine QUAD 36+ mos IM (Fluarix, Quad PF)  . CBC/D/Plt+RPR+Rh+ABO+Rub Ab...  . Genetic Screening  . AFP, Serum, Open Spina Bifida    Follow up in 4 weeks. 50% of 20 min visit spent on counseling and coordination of care.  Brock Bad, MD 03/17/2020 10:25 AM

## 2020-03-17 NOTE — Progress Notes (Signed)
Patient presents for Initial prenatal visit. Patient has no concerns today.  

## 2020-03-18 ENCOUNTER — Telehealth: Payer: Self-pay | Admitting: General Practice

## 2020-03-18 LAB — CERVICOVAGINAL ANCILLARY ONLY
Bacterial Vaginitis (gardnerella): NEGATIVE
Candida Glabrata: NEGATIVE
Candida Vaginitis: NEGATIVE
Chlamydia: NEGATIVE
Comment: NEGATIVE
Comment: NEGATIVE
Comment: NEGATIVE
Comment: NEGATIVE
Comment: NEGATIVE
Comment: NORMAL
Neisseria Gonorrhea: NEGATIVE
Trichomonas: NEGATIVE

## 2020-03-18 NOTE — Telephone Encounter (Signed)
Pt stated she already got vaccinated and d/c the call

## 2020-03-19 LAB — CULTURE, OB URINE

## 2020-03-19 LAB — CYTOLOGY - PAP: Diagnosis: NEGATIVE

## 2020-03-19 LAB — URINE CULTURE, OB REFLEX

## 2020-03-24 ENCOUNTER — Encounter: Payer: Self-pay | Admitting: *Deleted

## 2020-03-24 ENCOUNTER — Ambulatory Visit: Payer: Medicaid Other | Attending: Obstetrics | Admitting: *Deleted

## 2020-03-24 ENCOUNTER — Other Ambulatory Visit: Payer: Self-pay

## 2020-03-24 ENCOUNTER — Other Ambulatory Visit: Payer: Self-pay | Admitting: *Deleted

## 2020-03-24 ENCOUNTER — Encounter: Payer: Self-pay | Admitting: Obstetrics

## 2020-03-24 ENCOUNTER — Ambulatory Visit (HOSPITAL_BASED_OUTPATIENT_CLINIC_OR_DEPARTMENT_OTHER): Payer: Medicaid Other

## 2020-03-24 VITALS — BP 116/70 | HR 100

## 2020-03-24 DIAGNOSIS — O2302 Infections of kidney in pregnancy, second trimester: Secondary | ICD-10-CM | POA: Insufficient documentation

## 2020-03-24 DIAGNOSIS — O0990 Supervision of high risk pregnancy, unspecified, unspecified trimester: Secondary | ICD-10-CM

## 2020-03-24 DIAGNOSIS — Z6830 Body mass index (BMI) 30.0-30.9, adult: Secondary | ICD-10-CM

## 2020-03-24 DIAGNOSIS — O09892 Supervision of other high risk pregnancies, second trimester: Secondary | ICD-10-CM | POA: Diagnosis not present

## 2020-03-24 DIAGNOSIS — O09299 Supervision of pregnancy with other poor reproductive or obstetric history, unspecified trimester: Secondary | ICD-10-CM | POA: Diagnosis present

## 2020-03-24 DIAGNOSIS — Z3A17 17 weeks gestation of pregnancy: Secondary | ICD-10-CM | POA: Insufficient documentation

## 2020-03-24 DIAGNOSIS — O09292 Supervision of pregnancy with other poor reproductive or obstetric history, second trimester: Secondary | ICD-10-CM | POA: Insufficient documentation

## 2020-03-24 DIAGNOSIS — Z362 Encounter for other antenatal screening follow-up: Secondary | ICD-10-CM

## 2020-03-24 LAB — CBC/D/PLT+RPR+RH+ABO+RUB AB...
Antibody Screen: NEGATIVE
Basophils Absolute: 0 10*3/uL (ref 0.0–0.2)
Basos: 0 %
EOS (ABSOLUTE): 0.1 10*3/uL (ref 0.0–0.4)
Eos: 2 %
HCV Ab: <0.1 s/co ratio (ref 0.0–0.9)
HIV Screen 4th Generation wRfx: NONREACTIVE
Hematocrit: 33.6 % — ABNORMAL LOW (ref 34.0–46.6)
Hemoglobin: 11.4 g/dL (ref 11.1–15.9)
Hepatitis B Surface Ag: NEGATIVE
Immature Grans (Abs): 0.1 10*3/uL (ref 0.0–0.1)
Immature Granulocytes: 1 %
Lymphocytes Absolute: 1 10*3/uL (ref 0.7–3.1)
Lymphs: 13 %
MCH: 29.5 pg (ref 26.6–33.0)
MCHC: 33.9 g/dL (ref 31.5–35.7)
MCV: 87 fL (ref 79–97)
Monocytes Absolute: 0.5 10*3/uL (ref 0.1–0.9)
Monocytes: 6 %
Neutrophils Absolute: 6.5 10*3/uL (ref 1.4–7.0)
Neutrophils: 78 %
Platelets: 218 10*3/uL (ref 150–450)
RBC: 3.86 x10E6/uL (ref 3.77–5.28)
RDW: 13.2 % (ref 11.7–15.4)
RPR Ser Ql: NONREACTIVE
Rh Factor: POSITIVE
Rubella Antibodies, IGG: 8.53 index (ref >0.99)
WBC: 8.1 10*3/uL (ref 3.4–10.8)

## 2020-03-24 LAB — AFP, SERUM, OPEN SPINA BIFIDA
AFP Value: 57.3 ng/mL
Maternal Age At EDD: 29.3 yr
Weight: 165 [lb_av]

## 2020-03-24 LAB — HCV INTERPRETATION

## 2020-03-26 ENCOUNTER — Encounter: Payer: Self-pay | Admitting: Obstetrics

## 2020-04-04 NOTE — L&D Delivery Note (Addendum)
OB/GYN Faculty Practice Delivery Note  Theresa Lawson is a 30 y.o. [id number] s/p SVD at [redacted]w[redacted]d. She was admitted for eIOL.   ROM: 0h 52m with clear fluid GBS Status:  Negative/-- (04/27 1121) Maximum Maternal Temperature: 98.7 F  Labor Progress: Patient presented to L&D for eIOL. Initial SVE: 3.5/thick/-3. Labor course was uncomplicated. She then progressed to complete.   Delivery Date/Time: 08/28/20 1023 Delivery: Called to room and patient was complete and pushing. Head position was LOA and delivered with ease over the perineum. Delivered through nuchal cord. Shoulder and body delivered in usual fashion. Baby delivered partially en caul. Infant with spontaneous cry, placed on mother's abdomen, dried and stimulated. Cord clamped x 2 after 1-minute delay, and cut by FOB. Cord blood drawn. Placenta delivered spontaneously with gentle cord traction. Fundus firm with massage and pitocin started. Labia, perineum, vagina, and cervix inspected and significant for 1 minor L labial abrasion and 1 minor R labial abrasion Baby Weight: pending  Cord: central insertion, 3 vessel Placenta: Sent to L&D Complications: None Lacerations: none, two minor bilateral labial abrasions, no need for repair EBL: 50cc Analgesia: IV pain meds x1  Infant: APGAR (1 MIN): 9 APGAR (5 MINS): 10   Will Weisgerber MD, PGY-1 OBGYN Faculty Teaching Service  08/28/2020, 10:38 AM  I was gloved and present for the delivery in its entirety, and I agree with the above resident's note.    Raelyn Mora, CNM 08/28/2020 11:13 AM

## 2020-04-14 ENCOUNTER — Ambulatory Visit (INDEPENDENT_AMBULATORY_CARE_PROVIDER_SITE_OTHER): Payer: Medicaid Other | Admitting: Obstetrics and Gynecology

## 2020-04-14 ENCOUNTER — Ambulatory Visit: Payer: Medicaid Other

## 2020-04-14 ENCOUNTER — Other Ambulatory Visit: Payer: Self-pay

## 2020-04-14 ENCOUNTER — Encounter: Payer: Self-pay | Admitting: Obstetrics and Gynecology

## 2020-04-14 VITALS — BP 120/75 | HR 103 | Wt 169.6 lb

## 2020-04-14 DIAGNOSIS — Z8759 Personal history of other complications of pregnancy, childbirth and the puerperium: Secondary | ICD-10-CM

## 2020-04-14 DIAGNOSIS — Z3480 Encounter for supervision of other normal pregnancy, unspecified trimester: Secondary | ICD-10-CM

## 2020-04-14 LAB — POCT URINALYSIS DIPSTICK
Bilirubin, UA: NEGATIVE
Blood, UA: NEGATIVE
Glucose, UA: NEGATIVE
Ketones, UA: NEGATIVE
Leukocytes, UA: NEGATIVE
Nitrite, UA: NEGATIVE
Protein, UA: NEGATIVE
Spec Grav, UA: 1.02 (ref 1.010–1.025)
Urobilinogen, UA: 0.2 E.U./dL
pH, UA: 6.5 (ref 5.0–8.0)

## 2020-04-14 MED ORDER — ASPIRIN EC 81 MG PO TBEC: 81.0000 mg | DELAYED_RELEASE_TABLET | Freq: Every day | ORAL | 2 refills | Status: DC

## 2020-04-14 NOTE — Progress Notes (Signed)
   PRENATAL VISIT NOTE  Subjective:  Theresa Lawson is a 30 y.o. [phone] at [redacted]w[redacted]d being seen today for ongoing prenatal care.  She is currently monitored for the following issues for this high-risk pregnancy and has History of gestational hypertension; Close exposure to COVID-19 virus; and Supervision of normal pregnancy, antepartum on their problem list.  Patient reports dysuria for the past few days.  Contractions: Not present. Vag. Bleeding: None.  Movement: Present. Denies leaking of fluid.   The following portions of the patient's history were reviewed and updated as appropriate: allergies, current medications, past family history, past medical history, past social history, past surgical history and problem list.   Objective:   Vitals:   04/14/20 1132  BP: 120/75  Pulse: (!) 103  Weight: 169 lb 9.6 oz (76.9 kg)    Fetal Status: Fetal Heart Rate (bpm): 145 Fundal Height: 20 cm Movement: Present     General:  Alert, oriented and cooperative. Patient is in no acute distress.  Skin: Skin is warm and dry. No rash noted.   Cardiovascular: Normal heart rate noted  Respiratory: Normal respiratory effort, no problems with respiration noted  Abdomen: Soft, gravid, appropriate for gestational age.  Pain/Pressure: Absent     Pelvic: Cervical exam deferred        Extremities: Normal range of motion.     Mental Status: Normal mood and affect. Normal behavior. Normal judgment and thought content.   Assessment and Plan:  Pregnancy: [id number] at [redacted]w[redacted]d 1. Supervision of other normal pregnancy, antepartum Patient is doing well Ultrasound report reviewed with the patient- follow up scheduled to complete anatomy Urine culture collected  2. History of gestational hypertension Normotensive Patient did not start ASA- education provided on benefits of ASA in the context of history of GHTN- new Rx provided  Preterm labor symptoms and general obstetric precautions including but not limited to vaginal  bleeding, contractions, leaking of fluid and fetal movement were reviewed in detail with the patient. Please refer to After Visit Summary for other counseling recommendations.   Return in about 4 weeks (around 05/12/2020) for in person, ROB, High risk.  Future Appointments  Date Time Provider Department Center  04/22/2020  1:00 PM [facility] NURSE [facility]  04/22/2020  1:15 PM [facility] US2 [facility]    Catalina Antigua, MD

## 2020-04-14 NOTE — Progress Notes (Signed)
Pt states irritation with urination x 2-3 days.   Will check urine today. Pt denies any vaginal odor/irritation.

## 2020-04-16 LAB — URINE CULTURE, OB REFLEX

## 2020-04-16 LAB — CULTURE, OB URINE

## 2020-04-22 ENCOUNTER — Ambulatory Visit: Payer: Medicaid Other | Admitting: *Deleted

## 2020-04-22 ENCOUNTER — Encounter: Payer: Self-pay | Admitting: *Deleted

## 2020-04-22 ENCOUNTER — Ambulatory Visit: Payer: Medicaid Other | Attending: Obstetrics

## 2020-04-22 ENCOUNTER — Other Ambulatory Visit: Payer: Self-pay

## 2020-04-22 VITALS — BP 126/71 | HR 67

## 2020-04-22 DIAGNOSIS — Z362 Encounter for other antenatal screening follow-up: Secondary | ICD-10-CM | POA: Insufficient documentation

## 2020-04-22 DIAGNOSIS — O2692 Pregnancy related conditions, unspecified, second trimester: Secondary | ICD-10-CM | POA: Diagnosis not present

## 2020-04-22 DIAGNOSIS — O09292 Supervision of pregnancy with other poor reproductive or obstetric history, second trimester: Secondary | ICD-10-CM | POA: Diagnosis not present

## 2020-04-22 DIAGNOSIS — O321XX0 Maternal care for breech presentation, not applicable or unspecified: Secondary | ICD-10-CM | POA: Diagnosis not present

## 2020-04-22 DIAGNOSIS — O09892 Supervision of other high risk pregnancies, second trimester: Secondary | ICD-10-CM | POA: Diagnosis not present

## 2020-04-22 DIAGNOSIS — Z3A22 22 weeks gestation of pregnancy: Secondary | ICD-10-CM

## 2020-04-23 ENCOUNTER — Other Ambulatory Visit: Payer: Self-pay | Admitting: *Deleted

## 2020-04-23 DIAGNOSIS — O132 Gestational [pregnancy-induced] hypertension without significant proteinuria, second trimester: Secondary | ICD-10-CM

## 2020-05-12 ENCOUNTER — Encounter: Payer: Medicaid Other | Admitting: Obstetrics and Gynecology

## 2020-05-14 ENCOUNTER — Ambulatory Visit (INDEPENDENT_AMBULATORY_CARE_PROVIDER_SITE_OTHER): Payer: Medicaid Other | Admitting: Obstetrics and Gynecology

## 2020-05-14 ENCOUNTER — Other Ambulatory Visit: Payer: Self-pay

## 2020-05-14 ENCOUNTER — Encounter: Payer: Medicaid Other | Admitting: Obstetrics and Gynecology

## 2020-05-14 VITALS — BP 122/82 | HR 92 | Wt 173.8 lb

## 2020-05-14 DIAGNOSIS — Z3490 Encounter for supervision of normal pregnancy, unspecified, unspecified trimester: Secondary | ICD-10-CM

## 2020-05-14 DIAGNOSIS — Z8759 Personal history of other complications of pregnancy, childbirth and the puerperium: Secondary | ICD-10-CM

## 2020-05-14 MED ORDER — PRENATAL VITAMINS 28-0.8 MG PO TABS: 1.0000 | ORAL_TABLET | Freq: Every day | ORAL | 3 refills | Status: DC

## 2020-05-14 NOTE — Progress Notes (Signed)
Patient reports fetal movement, denies pain. Pt reports that she is not taking baby ASA because she does not want to take any unnecessary medications if her BP is not high.

## 2020-05-14 NOTE — Patient Instructions (Signed)

## 2020-05-14 NOTE — Progress Notes (Signed)
   PRENATAL VISIT NOTE  Subjective:  Theresa Lawson is a 30 y.o. [phone] at [redacted]w[redacted]d being seen today for ongoing prenatal care.  She is currently monitored for the following issues for this low-risk pregnancy and has History of gestational hypertension; Close exposure to COVID-19 virus; and Supervision of normal pregnancy, antepartum on their problem list.  Patient reports no complaints.  Contractions: Not present. Vag. Bleeding: None.  Movement: Present. Denies leaking of fluid.   The following portions of the patient's history were reviewed and updated as appropriate: allergies, current medications, past family history, past medical history, past social history, past surgical history and problem list.   Objective:   Vitals:   05/14/20 1455  BP: 122/82  Pulse: 92  Weight: 173 lb 12.8 oz (78.8 kg)    Fetal Status: Fetal Heart Rate (bpm): 145 Fundal Height: 28 cm Movement: Present     General:  Alert, oriented and cooperative. Patient is in no acute distress.  Skin: Skin is warm and dry. No rash noted.   Cardiovascular: Normal heart rate noted  Respiratory: Normal respiratory effort, no problems with respiration noted  Abdomen: Soft, gravid, appropriate for gestational age.  Pain/Pressure: Absent     Pelvic: Cervical exam deferred        Extremities: Normal range of motion.  Edema: None  Mental Status: Normal mood and affect. Normal behavior. Normal judgment and thought content.   Assessment and Plan:  Pregnancy: [id number] at [redacted]w[redacted]d 1. Encounter for supervision of normal pregnancy, antepartum, unspecified gravidity  Doing well Reviewed labor and cervical ripening. She would like to avoid induction of labor BP good.  2 hour next visit  2. History of gestational hypertension  Not taking BASA, discussed in detail importance of taking given history of Pre E   Preterm labor symptoms and general obstetric precautions including but not limited to vaginal bleeding, contractions, leaking of  fluid and fetal movement were reviewed in detail with the patient. Please refer to After Visit Summary for other counseling recommendations.   Return in about 4 weeks (around 06/11/2020) for For 2 hour GTT, App scheduled is ok .  Future Appointments  Date Time Provider Department Center  06/11/2020  8:30 AM [facility] None  06/11/2020  8:55 AM Sharyon Cable, CNM [facility] None  06/17/2020  1:00 PM [facility]  06/17/2020  1:15 PM [facility] US2 [facility]    Venia Carbon, NP

## 2020-06-08 ENCOUNTER — Other Ambulatory Visit: Payer: Self-pay | Admitting: Obstetrics

## 2020-06-08 DIAGNOSIS — R21 Rash and other nonspecific skin eruption: Secondary | ICD-10-CM

## 2020-06-08 MED ORDER — PREDNISONE 10 MG (21) PO TBPK: ORAL_TABLET | ORAL | 0 refills | Status: DC

## 2020-06-11 ENCOUNTER — Other Ambulatory Visit: Payer: Medicaid Other

## 2020-06-11 ENCOUNTER — Ambulatory Visit (INDEPENDENT_AMBULATORY_CARE_PROVIDER_SITE_OTHER): Payer: Medicaid Other | Admitting: Certified Nurse Midwife

## 2020-06-11 ENCOUNTER — Encounter: Payer: Self-pay | Admitting: Certified Nurse Midwife

## 2020-06-11 VITALS — BP 121/72 | HR 72 | Wt 180.0 lb

## 2020-06-11 DIAGNOSIS — Z3480 Encounter for supervision of other normal pregnancy, unspecified trimester: Secondary | ICD-10-CM

## 2020-06-11 DIAGNOSIS — Z3A29 29 weeks gestation of pregnancy: Secondary | ICD-10-CM

## 2020-06-11 DIAGNOSIS — L299 Pruritus, unspecified: Secondary | ICD-10-CM

## 2020-06-11 DIAGNOSIS — O99713 Diseases of the skin and subcutaneous tissue complicating pregnancy, third trimester: Secondary | ICD-10-CM

## 2020-06-11 DIAGNOSIS — Z8759 Personal history of other complications of pregnancy, childbirth and the puerperium: Secondary | ICD-10-CM

## 2020-06-11 MED ORDER — TRIAMCINOLONE ACETONIDE 0.1 % EX CREA: 1.0000 "application " | TOPICAL_CREAM | Freq: Two times a day (BID) | CUTANEOUS | 0 refills | Status: DC

## 2020-06-11 NOTE — Patient Instructions (Signed)
Oral Glucose Tolerance Test During Pregnancy Why am I having this test? The oral glucose tolerance test (OGTT) is done to check how your body processes blood sugar (glucose). This is one of several tests used to diagnose diabetes that develops during pregnancy (gestational diabetes mellitus). Gestational diabetes is a short-term form of diabetes that some women develop while they are pregnant. It usually occurs during the second trimester of pregnancy and goes away after delivery. Testing, or screening, for gestational diabetes usually occurs at weeks 24-28 of pregnancy. You may have the OGTT test after having a 1-hour glucose screening test if the results from that test indicate that you may have gestational diabetes. This test may also be needed if:  You have a history of gestational diabetes.  There is a history of giving birth to very large babies or of losing pregnancies (having stillbirths).  You have signs and symptoms of diabetes, such as: ? Changes in your eyesight. ? Tingling or numbness in your hands or feet. ? Changes in hunger, thirst, and urination, and these are not explained by your pregnancy. What is being tested? This test measures the amount of glucose in your blood at different times during a period of 3 hours. This shows how well your body can process glucose. What kind of sample is taken? Blood samples are required for this test. They are usually collected by inserting a needle into a blood vessel.   How do I prepare for this test?  For 3 days before your test, eat normally. Have plenty of carbohydrate-rich foods.  Follow instructions from your health care provider about: ? Eating or drinking restrictions on the day of the test. You may be asked not to eat or drink anything other than water (to fast) starting 8-10 hours before the test. ? Changing or stopping your regular medicines. Some medicines may interfere with this test. Tell a health care provider about:  All  medicines you are taking, including vitamins, herbs, eye drops, creams, and over-the-counter medicines.  Any blood disorders you have.  Any surgeries you have had.  Any medical conditions you have. What happens during the test? First, your blood glucose will be measured. This is referred to as your fasting blood glucose because you fasted before the test. Then, you will drink a glucose solution that contains a certain amount of glucose. Your blood glucose will be measured again 1, 2, and 3 hours after you drink the solution. This test takes about 3 hours to complete. You will need to stay at the testing location during this time. During the testing period:  Do not eat or drink anything other than the glucose solution.  Do not exercise.  Do not use any products that contain nicotine or tobacco, such as cigarettes, e-cigarettes, and chewing tobacco. These can affect your test results. If you need help quitting, ask your health care provider. The testing procedure may vary among health care providers and hospitals. How are the results reported? Your results will be reported as milligrams of glucose per deciliter of blood (mg/dL) or millimoles per liter (mmol/L). There is more than one source for screening and diagnosis reference values used to diagnose gestational diabetes. Your health care provider will compare your results to normal values that were established after testing a large group of people (reference values). Reference values may vary among labs and hospitals. For this test (Carpenter-Coustan), reference values are:  Fasting: 95 mg/dL (5.3 mmol/L).  1 hour: 180 mg/dL (10.0 mmol/L).  2 hour:   155 mg/dL (8.6 mmol/L).  3 hour: 140 mg/dL (7.8 mmol/L). What do the results mean? Results below the reference values are considered normal. If two or more of your blood glucose levels are at or above the reference values, you may be diagnosed with gestational diabetes. If only one level is  high, your health care provider may suggest repeat testing or other tests to confirm a diagnosis. Talk with your health care provider about what your results mean. Questions to ask your health care provider Ask your health care provider, or the department that is doing the test:  When will my results be ready?  How will I get my results?  What are my treatment options?  What other tests do I need?  What are my next steps? Summary  The oral glucose tolerance test (OGTT) is one of several tests used to diagnose diabetes that develops during pregnancy (gestational diabetes mellitus). Gestational diabetes is a short-term form of diabetes that some women develop while they are pregnant.  You may have the OGTT test after having a 1-hour glucose screening test if the results from that test show that you may have gestational diabetes. You may also have this test if you have any symptoms or risk factors for this type of diabetes.  Talk with your health care provider about what your results mean. This information is not intended to replace advice given to you by your health care provider. Make sure you discuss any questions you have with your health care provider. Document Revised: 08/29/2019 Document Reviewed: 08/29/2019 Elsevier Patient Education  2021 [employer]  

## 2020-06-11 NOTE — Progress Notes (Signed)
Decline tdap  

## 2020-06-11 NOTE — Progress Notes (Signed)
   PRENATAL VISIT NOTE  Subjective:  Theresa Lawson is a 30 y.o. [phone] at [redacted]w[redacted]d being seen today for ongoing prenatal care.  She is currently monitored for the following issues for this low-risk pregnancy and has History of gestational hypertension; Close exposure to COVID-19 virus; and Supervision of normal pregnancy, antepartum on their problem list.  Patient reports rash and pruritus .  Contractions: Not present. Vag. Bleeding: None.  Movement: Present. Denies leaking of fluid.   The following portions of the patient's history were reviewed and updated as appropriate: allergies, current medications, past family history, past medical history, past social history, past surgical history and problem list.   Objective:   Vitals:   06/11/20 0905  BP: 121/72  Pulse: 72  Weight: 180 lb (81.6 kg)    Fetal Status: Fetal Heart Rate (bpm): 146 Fundal Height: 32 cm Movement: Present     General:  Alert, oriented and cooperative. Patient is in no acute distress.  Skin: Skin is warm and dry. No rash noted.   Cardiovascular: Normal heart rate noted  Respiratory: Normal respiratory effort, no problems with respiration noted  Abdomen: Soft, gravid, appropriate for gestational age.  Pain/Pressure: Absent     Pelvic: Cervical exam deferred        Extremities: Normal range of motion.  Edema: None  Mental Status: Normal mood and affect. Normal behavior. Normal judgment and thought content.   Assessment and Plan:  Pregnancy: [id number] at [redacted]w[redacted]d 1. Supervision of other normal pregnancy, antepartum - Routine prenatal care  - Anticipatory guidance on upcoming appointments  - Educated and discussed GTT with patient and what abnormal results mean  - Glucose Tolerance, 2 Hours w/1 Hour - RPR - CBC - HIV Antibody (routine testing w rflx)  2. History of gestational hypertension - BP stable today   3. [redacted] weeks gestation of pregnancy  4. Pruritus gravidarum, third trimester - Patient reports rash been  occurring since past Wednesday, patient reports that rash has continued to get worse  - Was given prednisone for rash but patient reports not taking it due to preferring cream  - PUPPS vs pruritus gravidarum vs cholestasis  - Bile acids, total - triamcinolone (KENALOG) 0.1 %; Apply 1 application topically 2 (two) times daily.  Dispense: 45 g; Refill: 0  Preterm labor symptoms and general obstetric precautions including but not limited to vaginal bleeding, contractions, leaking of fluid and fetal movement were reviewed in detail with the patient. Please refer to After Visit Summary for other counseling recommendations.   Return in about 2 weeks (around 06/25/2020) for LROB, in person.  Future Appointments  Date Time Provider Department Center  06/17/2020  1:00 PM [facility] N[facility]  06/17/2020  1:15 PM [facility] US2 [facility]  06/25/2020 11:00 AM Warden Fillers, MD [facility] None    Sharyon Cable, CNM

## 2020-06-13 LAB — CBC
Hematocrit: 33.8 % — ABNORMAL LOW (ref 34.0–46.6)
Hemoglobin: 11.4 g/dL (ref 11.1–15.9)
MCH: 29.2 pg (ref 26.6–33.0)
MCHC: 33.7 g/dL (ref 31.5–35.7)
MCV: 87 fL (ref 79–97)
Platelets: 222 10*3/uL (ref 150–450)
RBC: 3.9 x10E6/uL (ref 3.77–5.28)
RDW: 12.2 % (ref 11.7–15.4)
WBC: 10.5 10*3/uL (ref 3.4–10.8)

## 2020-06-13 LAB — GLUCOSE TOLERANCE, 2 HOURS W/ 1HR
Glucose, 1 hour: 135 mg/dL (ref 65–179)
Glucose, 2 hour: 110 mg/dL (ref 65–152)
Glucose, Fasting: 78 mg/dL (ref 65–91)

## 2020-06-13 LAB — HIV ANTIBODY (ROUTINE TESTING W REFLEX): HIV Screen 4th Generation wRfx: NONREACTIVE

## 2020-06-13 LAB — RPR: RPR Ser Ql: NONREACTIVE

## 2020-06-13 LAB — BILE ACIDS, TOTAL: Bile Acids Total: 2.8 umol/L (ref 0.0–10.0)

## 2020-06-17 ENCOUNTER — Ambulatory Visit (HOSPITAL_BASED_OUTPATIENT_CLINIC_OR_DEPARTMENT_OTHER): Payer: Medicaid Other

## 2020-06-17 ENCOUNTER — Encounter: Payer: Self-pay | Admitting: *Deleted

## 2020-06-17 ENCOUNTER — Ambulatory Visit: Payer: Medicaid Other | Attending: Obstetrics and Gynecology | Admitting: *Deleted

## 2020-06-17 ENCOUNTER — Other Ambulatory Visit: Payer: Self-pay

## 2020-06-17 VITALS — BP 122/68 | HR 96

## 2020-06-17 DIAGNOSIS — O09893 Supervision of other high risk pregnancies, third trimester: Secondary | ICD-10-CM | POA: Diagnosis not present

## 2020-06-17 DIAGNOSIS — Z3A30 30 weeks gestation of pregnancy: Secondary | ICD-10-CM | POA: Insufficient documentation

## 2020-06-17 DIAGNOSIS — O2303 Infections of kidney in pregnancy, third trimester: Secondary | ICD-10-CM | POA: Diagnosis not present

## 2020-06-17 DIAGNOSIS — O133 Gestational [pregnancy-induced] hypertension without significant proteinuria, third trimester: Secondary | ICD-10-CM

## 2020-06-17 DIAGNOSIS — O132 Gestational [pregnancy-induced] hypertension without significant proteinuria, second trimester: Secondary | ICD-10-CM

## 2020-06-17 DIAGNOSIS — O09293 Supervision of pregnancy with other poor reproductive or obstetric history, third trimester: Secondary | ICD-10-CM

## 2020-06-25 ENCOUNTER — Ambulatory Visit (INDEPENDENT_AMBULATORY_CARE_PROVIDER_SITE_OTHER): Payer: Medicaid Other | Admitting: Obstetrics and Gynecology

## 2020-06-25 ENCOUNTER — Other Ambulatory Visit: Payer: Self-pay

## 2020-06-25 VITALS — BP 124/83 | HR 114 | Wt 181.0 lb

## 2020-06-25 DIAGNOSIS — Z3A31 31 weeks gestation of pregnancy: Secondary | ICD-10-CM

## 2020-06-25 DIAGNOSIS — Z3480 Encounter for supervision of other normal pregnancy, unspecified trimester: Secondary | ICD-10-CM

## 2020-06-25 DIAGNOSIS — Z8759 Personal history of other complications of pregnancy, childbirth and the puerperium: Secondary | ICD-10-CM

## 2020-06-25 NOTE — Progress Notes (Signed)
ROB [redacted]w[redacted]d  CC: None  

## 2020-06-25 NOTE — Progress Notes (Signed)
   PRENATAL VISIT NOTE  Subjective:  Theresa Lawson is a 30 y.o. [phone] at [redacted]w[redacted]d being seen today for ongoing prenatal care.  She is currently monitored for the following issues for this low-risk pregnancy and has History of gestational hypertension; Close exposure to COVID-19 virus; Supervision of normal pregnancy, antepartum; and [redacted] weeks gestation of pregnancy on their problem list.  Patient doing well with no acute concerns today. She reports no complaints.  Contractions: Not present. Vag. Bleeding: None.  Movement: Present. Denies leaking of fluid.   The following portions of the patient's history were reviewed and updated as appropriate: allergies, current medications, past family history, past medical history, past social history, past surgical history and problem list. Problem list updated.  Objective:   Vitals:   06/25/20 1107  BP: 124/83  Pulse: (!) 114  Weight: 181 lb (82.1 kg)    Fetal Status: Fetal Heart Rate (bpm): 140 Fundal Height: 32 cm Movement: Present     General:  Alert, oriented and cooperative. Patient is in no acute distress.  Skin: Skin is warm and dry. No rash noted.   Cardiovascular: Normal heart rate noted  Respiratory: Normal respiratory effort, no problems with respiration noted  Abdomen: Soft, gravid, appropriate for gestational age.  Pain/Pressure: Absent     Pelvic: Cervical exam deferred        Extremities: Normal range of motion.  Edema: None  Mental Status:  Normal mood and affect. Normal behavior. Normal judgment and thought content.   Assessment and Plan:  Pregnancy: [id number] at [redacted]w[redacted]d  1. History of gestational hypertension BP WNL today  2. Supervision of other normal pregnancy, antepartum   3. [redacted] weeks gestation of pregnancy   Preterm labor symptoms and general obstetric precautions including but not limited to vaginal bleeding, contractions, leaking of fluid and fetal movement were reviewed in detail with the patient.  Please refer to  After Visit Summary for other counseling recommendations.   Return in about 2 weeks (around 07/09/2020) for ROB, in person.   Mariel Aloe, MD [facility] Attending [facility]

## 2020-06-25 NOTE — Patient Instructions (Addendum)
Third Trimester of Pregnancy  The third trimester of pregnancy is from week 28 through week 40. This is also called months 7 through 9. This trimester is when your unborn baby (fetus) is growing very fast. At the end of the ninth month, the unborn baby is about 20 inches long. It weighs about 6-10 pounds. Body changes during your third trimester Your body continues to go through many changes during this time. The changes vary and generally return to normal after the baby is born. Physical changes  Your weight will continue to increase. You may gain 25-35 pounds (11-16 kg) by the end of the pregnancy. If you are underweight, you may gain 28-40 lb (about 13-18 kg). If you are overweight, you may gain 15-25 lb (about 7-11 kg).  You may start to get stretch marks on your hips, belly (abdomen), and breasts.  Your breasts will continue to grow and may hurt. A yellow fluid (colostrum) may leak from your breasts. This is the first milk you are making for your baby.  You may have changes in your hair.  Your belly button may stick out.  You may have more swelling in your hands, face, or ankles. Health changes  You may have heartburn.  You may have trouble pooping (constipation).  You may get hemorrhoids. These are swollen veins in the butt that can itch or get painful.  You may have swollen veins (varicose veins) in your legs.  You may have more body aches in the pelvis, back, or thighs.  You may have more tingling or numbness in your hands, arms, and legs. The skin on your belly may also feel numb.  You may feel short of breath as your womb (uterus) gets bigger. Other changes  You may pee (urinate) more often.  You may have more problems sleeping.  You may notice the unborn baby "dropping," or moving lower in your belly.  You may have more discharge coming from your vagina.  Your joints may feel loose, and you may have pain around your pelvic bone. Follow these instructions at  home: Medicines  Take over-the-counter and prescription medicines only as told by your doctor. Some medicines are not safe during pregnancy.  Take a prenatal vitamin that contains at least 600 micrograms (mcg) of folic acid. Eating and drinking  Eat healthy meals that include: ? Fresh fruits and vegetables. ? Whole grains. ? Good sources of protein, such as meat, eggs, or tofu. ? Low-fat dairy products.  Avoid raw meat and unpasteurized juice, milk, and cheese. These carry germs that can harm you and your baby.  Eat 4 or 5 small meals rather than 3 large meals a day.  You may need to take these actions to prevent or treat trouble pooping: ? Drink enough fluids to keep your pee (urine) pale yellow. ? Eat foods that are high in fiber. These include beans, whole grains, and fresh fruits and vegetables. ? Limit foods that are high in fat and sugar. These include fried or sweet foods. Activity  Exercise only as told by your doctor. Stop exercising if you start to have cramps in your womb.  Avoid heavy lifting.  Do not exercise if it is too hot or too humid, or if you are in a place of great height (high altitude).  If you choose to, you may have sex unless your doctor tells you not to. Relieving pain and discomfort  Take breaks often, and rest with your legs raised (elevated) if you have   leg cramps or low back pain.  Take warm water baths (sitz baths) to soothe pain or discomfort caused by hemorrhoids. Use hemorrhoid cream if your doctor approves.  Wear a good support bra if your breasts are tender.  If you develop bulging, swollen veins in your legs: ? Wear support hose as told by your doctor. ? Raise your feet for 15 minutes, 3-4 times a day. ? Limit salt in your food. Safety  Talk to your doctor before traveling far distances.  Do not use hot tubs, steam rooms, or saunas.  Wear your seat belt at all times when you are in a car.  Talk with your doctor if someone is  hurting you or yelling at you a lot. Preparing for your baby's arrival To prepare for the arrival of your baby:  Take prenatal classes.  Visit the hospital and tour the maternity area.  Buy a rear-facing car seat. Learn how to install it in your car.  Prepare the baby's room. Take out all pillows and stuffed animals from the baby's crib. General instructions  Avoid cat litter boxes and soil used by cats. These carry germs that can cause harm to the baby and can cause a loss of your baby by miscarriage or stillbirth.  Do not douche or use tampons. Do not use scented sanitary pads.  Do not smoke or use any products that contain nicotine or tobacco. If you need help quitting, ask your doctor.  Do not drink alcohol.  Do not use herbal medicines, illegal drugs, or medicines that were not approved by your doctor. Chemicals in these products can affect your baby.  Keep all follow-up visits. This is important. Where to find more information  American Pregnancy Association: americanpregnancy.org  American College of Obstetricians and Gynecologists: www.acog.org  Office on Women's Health: womenshealth.gov/pregnancy Contact a doctor if:  You have a fever.  You have mild cramps or pressure in your lower belly.  You have a nagging pain in your belly area.  You vomit, or you have watery poop (diarrhea).  You have bad-smelling fluid coming from your vagina.  You have pain when you pee, or your pee smells bad.  You have a headache that does not go away when you take medicine.  You have changes in how you see, or you see spots in front of your eyes. Get help right away if:  Your water breaks.  You have regular contractions that are less than 5 minutes apart.  You are spotting or bleeding from your vagina.  You have very bad belly cramps or pain.  You have trouble breathing.  You have chest pain.  You faint.  You have not felt the baby move for the amount of time told by  your doctor.  You have new or increased pain, swelling, or redness in an arm or leg. Summary  The third trimester is from week 28 through week 40 (months 7 through 9). This is the time when your unborn baby is growing very fast.  During this time, your discomfort may increase as you gain weight and as your baby grows.  Get ready for your baby to arrive by taking prenatal classes, buying a rear-facing car seat, and preparing the baby's room.  Get help right away if you are bleeding from your vagina, you have chest pain and trouble breathing, or you have not felt the baby move for the amount of time told by your doctor. This information is not intended to replace advice given   to you by your health care provider. Make sure you discuss any questions you have with your health care provider. Document Revised: 08/28/2019 Document Reviewed: 07/04/2019 Elsevier Patient Education  2021 [employer] Etonogestrel; Ethinyl Estradiol Vaginal Ring What is this medicine? ETONOGESTREL; ETHINYL ESTRADIOL (et oh noe JES trel; ETH in il es tra DYE ole) vaginal ring is a flexible, vaginal ring used as a contraceptive (birth control method). This product combines two types of female hormones, an estrogen and a progestin. It is used to prevent ovulation and pregnancy. Each ring is effective for 1 month. This medicine may be used for other purposes; ask your health care provider or pharmacist if you have questions. COMMON BRAND NAME(S): EluRyng, NuvaRing What should I tell my health care provider before I take this medicine? They need to know if you have any of these conditions:  abnormal vaginal bleeding  blood vessel disease or blood clots  breast, cervical, endometrial, ovarian, liver, or uterine cancer  diabetes  gallbladder disease  having surgery  heart disease or recent heart attack  high blood pressure  high cholesterol or triglycerides  history of irregular heartbeat or heart valve  problems  kidney disease  liver disease  migraine headaches  protein C deficiency  protein S deficiency  recently had a baby, miscarriage, or abortion  stroke  systemic lupus erythematosus (SLE)  tobacco smoker  your age is more than 29 years old  an unusual or allergic reaction to estrogens, progestins, other medicines, foods, dyes, or preservatives  pregnant or trying to get pregnant  breast-feeding How should I use this medicine? Insert the ring into your vagina as directed. Follow the directions on the prescription label. The ring will remain place for 3 weeks and is then removed for a 1-week break. A new ring is inserted 1 week after the last ring was removed, on the same day of the week. Check often to make sure the ring is still in place. If the ring was out of the vagina for an unknown amount of time, you may not be protected from pregnancy. Perform a pregnancy test and call your doctor. Do not use more often than directed. A patient package insert for the product will be given with each prescription and refill. Read this sheet carefully each time. The sheet may change frequently. Contact your pediatrician regarding the use of this medicine in children. Special care may be needed. Overdosage: If you think you have taken too much of this medicine contact a poison control center or emergency room at once. NOTE: This medicine is only for you. Do not share this medicine with others. What if I miss a dose? You will need to use the ring exactly as directed. It is very important to follow the schedule every cycle. If you do not use the ring as directed, you may not be protected from pregnancy. If the ring should slip out, is lost, or if you leave it in longer or shorter than you should, contact your health care professional for advice. What may interact with this medicine? Do not take this medicine with the following medications:  dasabuvir; ombitasvir; paritaprevir;  ritonavir  ombitasvir; paritaprevir; ritonavir  vaginal lubricants or other vaginal products that are oil-based or silicone-based This medicine may also interact with the following medications:  acetaminophen  antibiotics or medicines for infections, especially rifampin, rifabutin, rifapentine, and griseofulvin, and possibly penicillins or tetracyclines  aprepitant or fosaprepitant  armodafinil  ascorbic acid (vitamin C)  barbiturate medicines, such  as phenobarbital or primidone  bosentan  certain antiviral medicines for hepatitis, HIV or AIDS  certain medicines for cancer treatment  certain medicines for seizures like carbamazepine, clobazam, felbamate, lamotrigine, oxcarbazepine, phenytoin, rufinamide, topiramate  certain medicines for treating high cholesterol  cyclosporine  dantrolene  elagolix  flibanserin  grapefruit juice  lesinurad  medicines for diabetes  medicines to treat fungal infections, such as griseofulvin, miconazole, fluconazole, ketoconazole, itraconazole, posaconazole or voriconazole  mifepristone  mitotane  modafinil  morphine  mycophenolate  St. John's wort  tamoxifen  temazepam  theophylline or aminophylline  thyroid hormones  tizanidine  tranexamic acid  ulipristal  warfarin This list may not describe all possible interactions. Give your health care provider a list of all the medicines, herbs, non-prescription drugs, or dietary supplements you use. Also tell them if you smoke, drink alcohol, or use illegal drugs. Some items may interact with your medicine. What should I watch for while using this medicine? Visit your doctor or health care professional for regular checks on your progress. You will need a regular breast and pelvic exam and Pap smear while on this medicine. Check with your doctor or health care professional to see if you need an additional method of contraception during the first cycle that you use this  ring. Female condoms (made with natural rubber latex, polyisoprene, and polyurethane) and spermicides may be used. Do not use a diaphragm, cervical cap, or a female condom, as the ring can interfere with these birth control methods and their proper placement. If you have any reason to think you are pregnant, stop using this medicine right away and contact your doctor or health care professional. If you are using this medicine for hormone related problems, it may take several cycles of use to see improvement in your condition. Smoking increases the risk of getting a blood clot or having a stroke while you are using hormonal birth control, especially if you are more than 29 years old. You are strongly advised not to smoke. Some women are prone to getting dark patches on the skin of the face (cholasma). Your risk of getting chloasma with this medicine is higher if you had chloasma during a pregnancy. Keep out of the sun. If you cannot avoid being in the sun, wear protective clothing and use sunscreen. Do not use sun lamps or tanning beds/booths. This medicine can make your body retain fluid, making your fingers, hands, or ankles swell. Your blood pressure can go up. Contact your doctor or health care professional if you feel you are retaining fluid. If you are going to have elective surgery, you may need to stop using this medicine before the surgery. Consult your health care professional for advice. This medicine does not protect you against HIV infection (AIDS) or any other sexually transmitted diseases. What side effects may I notice from receiving this medicine? Side effects that you should report to your doctor or health care professional as soon as possible:  allergic reactions such as skin rash or itching, hives, swelling of the lips, mouth, tongue, or throat  depression  high blood pressure  migraines or severe, sudden headaches  signs and symptoms of a blood clot such as breathing problems;  changes in vision; chest pain; severe, sudden headache; pain, swelling, warmth in the leg; trouble speaking; sudden numbness or weakness of the face, arm or leg  signs and symptoms of infection like fever or chills with dizziness and a sunburn-like rash, or pain or trouble passing urine  stomach  pain  symptoms of vaginal infection like itching, irritation or unusual discharge  yellowing of the eyes or skin Side effects that usually do not require medical attention (report these to your doctor or health care professional if they continue or are bothersome):  acne  breast pain, tenderness  irregular vaginal bleeding or spotting, particularly during the first month of use  mild headache  nausea  painful periods  vomiting This list may not describe all possible side effects. Call your doctor for medical advice about side effects. You may report side effects to FDA at 1-800-FDA-1088. Where should I keep my medicine? Keep out of the reach of children. Store unopened medicine for up to 4 months at room temperature at 15 and 30 degrees C (59 and 86 degrees F). Protect from light. Do not store above 30 degrees C (86 degrees F). Throw away any unused medicine 4 months after the dispense date or the expiration date, whichever comes first. A ring may only be used for 1 cycle (1 month). After the 3-week cycle, a used ring is removed and should be placed in the re-closable foil pouch and discarded in the trash out of reach of children and pets. Do NOT flush down the toilet. NOTE: This sheet is a summary. It may not cover all possible information. If you have questions about this medicine, talk to your doctor, pharmacist, or health care provider.  2021 Elsevier/Gold Standard (2019-02-06 03:47:42)

## 2020-07-09 ENCOUNTER — Encounter: Payer: Medicaid Other | Admitting: Obstetrics and Gynecology

## 2020-07-29 ENCOUNTER — Ambulatory Visit (INDEPENDENT_AMBULATORY_CARE_PROVIDER_SITE_OTHER): Payer: Medicaid Other | Admitting: Women's Health

## 2020-07-29 ENCOUNTER — Other Ambulatory Visit (HOSPITAL_COMMUNITY)
Admission: RE | Admit: 2020-07-29 | Discharge: 2020-07-29 | Disposition: A | Discharge status: Home / Self Care | Payer: Medicaid Other | Source: Ambulatory Visit | Attending: Obstetrics and Gynecology | Admitting: Obstetrics and Gynecology

## 2020-07-29 ENCOUNTER — Other Ambulatory Visit: Payer: Self-pay

## 2020-07-29 VITALS — BP 136/83 | HR 111 | Wt 183.0 lb

## 2020-07-29 DIAGNOSIS — Z3480 Encounter for supervision of other normal pregnancy, unspecified trimester: Secondary | ICD-10-CM | POA: Diagnosis not present

## 2020-07-29 DIAGNOSIS — Z8759 Personal history of other complications of pregnancy, childbirth and the puerperium: Secondary | ICD-10-CM

## 2020-07-29 DIAGNOSIS — Z3A36 36 weeks gestation of pregnancy: Secondary | ICD-10-CM

## 2020-07-29 NOTE — Patient Instructions (Addendum)
Maternity Assessment Unit (MAU)  The Maternity Assessment Unit (MAU) is located at the [facility]. The address is: [address] [place], [place], [place] [address]. Please see map below for additional directions.    The Maternity Assessment Unit is designed to help you during your pregnancy, and for up to 6 weeks after delivery, with any pregnancy- or postpartum-related emergencies, if you think you are in labor, or if your water has broken. For example, if you experience nausea and vomiting, vaginal bleeding, severe abdominal or pelvic pain, elevated blood pressure or other problems related to your pregnancy or postpartum time, please come to the Maternity Assessment Unit for assistance.       AREA PEDIATRIC/FAMILY PRACTICE PHYSICIANS  [facility] [address] [place], [place] [address] Phone - [phone]   Fax - [place] [address] [place], [place]  [address] Phone - [phone]   Fax - [phone]  [place] [address] [place] [address] [place], [place]  [address] Phone - [phone]   Fax - [phone]  [facility] [address] [place], [place]  [address] Phone - [phone]   Fax - [phone]  [place] [address], [place], [place]  [address] Phone - [phone]   Fax - [place] [address] [place], [place]  [address] Phone - [phone]   Fax - [place] [facility] [place] [address], [place], [place]  [address] Phone - [phone]   Fax - [phone]  [place] [address] [place], [place], [place]  [address] Phone - [phone]   Fax - [place] [address] [place], [place]  [address] Phone - [phone]   Fax -  [phone] [facility] [place]. [address] [place], [place]  [address] Phone - [phone]   Fax - [phone]  [place] [address] [place], [place]  [address] Phone - [phone]   Fax - [phone]  [facility] [place], [place], [place]  [address] Phone - [phone]   Fax - [place] [address], [place], [place]  [address] Phone - [phone]   Fax - [phone]  [facility] [address], [place], [place]  [address] Phone - [place], [place]  [address] Phone - [phone]   Fax - [place] [address], [place], [place]  [address] Phone - [phone]   Fax - [phone]  [place] [address] [place], [place]  [address] Phone - [phone]   Fax - [place]. [place], [place]  [address] Phone - [phone]   Fax - [place], [place], [place]  [address] Phone - [phone]   Fax - [place] [address], [address] [place], [place]  [address] Phone - [phone]   Fax - [phone]  [place]UB[place] [address] [place], [place]  [address] Phone - [phone]   Fax - [place] [address] [place],   [address] Phone - [phone]  Fax - [phone]  [facility] [address] [place], [place]  [address] Phone - [phone]   Fax - [phone] [place]arda Fraction [phone] [place] [place], [place]  [address] Phone - [phone]   Fax - [phone]  [place] [address] [place], [place]  [address] Phone - [phone]   Fax - [phone]  [facility] - [place] [address] [place], [place]  [address] Phone - [phone]   Fax - [phone]          Group B Streptococcus Test During Pregnancy Why am I having this test? Routine testing, also called screening, for group B streptococcus (GBS) is recommended for all pregnant women between the 36th and 37th week of pregnancy. GBS is a type of bacteria that can be passed from mother to baby during childbirth. Screening will help guide whether or not you will need treatment during labor and delivery to prevent complications such as:  An infection in your uterus during labor.  An infection in your uterus after delivery.  A serious infection in your baby after delivery, such as pneumonia, meningitis, or sepsis. GBS screening is not often done before 36 weeks of pregnancy unless you go into labor prematurely. What happens if I have group B streptococcus? If testing shows that you have GBS, your health care provider will recommend treatment with IV antibiotics during labor and delivery. This treatment significantly decreases the risk of complications for you and your baby. If you have a planned C-section and you have GBS, you may not need to be treated with antibiotics because GBS is usually passed to babies after labor starts and your water breaks. If you are in labor or your water breaks before your C-section, it is possible for GBS to get into your uterus and be passed to your baby, so you might need treatment. Is there a chance I may not need to be tested? You may not need to be tested for GBS if:  You have a urine test that shows GBS before 36 to 37 weeks.  You had a baby with GBS infection after a previous delivery. In these cases, you will automatically be treated for GBS during labor and delivery. What is being tested? This test is done to check if you have group B streptococcus in your vagina or rectum. What kind of sample is taken? To  collect samples for this test, your health care provider will swab your vagina and rectum with a cotton swab. The sample is then sent to the lab to see if GBS is present. What happens during the test?  You will remove your clothing from the waist down.  You will lie down on an exam table in the same position as you would for a pelvic exam.  Your health care provider will swab your vagina and rectum to collect samples for a culture test.  You will be able to go home after the test and do all your usual activities.   How are the results reported? The test results are reported as positive or negative. What do the results mean?  A positive test means you are at risk for passing GBS to your baby during labor and delivery. Your health care provider will recommend that you are treated with an IV antibiotic during labor and delivery.  A negative test means you are at very low risk of passing GBS to your baby. There is still a low  risk of passing GBS to your baby because sometimes test results may report that you do not have a condition when you do (false-negative result) or there is a chance that you may become infected with GBS after the test is done. You most likely will not need to be treated with an antibiotic during labor and delivery. Talk with your health care provider about what your results mean. Questions to ask your health care provider Ask your health care provider, or the department that is doing the test:  When will my results be ready?  How will I get my results?  What are my treatment options? Summary  Routine testing (screening) for group B streptococcus (GBS) is recommended for all pregnant women between the 36th and 37th week of pregnancy.  GBS is a type of bacteria that can be passed from mother to baby during childbirth.  If testing shows that you have GBS, your health care provider will recommend that you are treated with IV antibiotics during labor and delivery. This  treatment almost always prevents infection in newborns. This information is not intended to replace advice given to you by your health care provider. Make sure you discuss any questions you have with your health care provider. Document Revised: 01/21/2020 Document Reviewed: 04/18/2018 Elsevier Patient Education  2021 Elsevier Inc.        Signs and Symptoms of Labor Labor is the body's natural process of moving the baby and the placenta out of the uterus. The process of labor usually starts when the baby is full-term, between 48 and 40 weeks of pregnancy. Signs and symptoms that you are close to going into labor As your body prepares for labor and the birth of your baby, you may notice the following symptoms in the weeks and days before true labor starts:  Passing a small amount of thick, bloody mucus from your vagina. This is called normal bloody show or losing your mucus plug. This may happen more than a week before labor begins, or right before labor begins, as the opening of the cervix starts to widen (dilate). For some women, the entire mucus plug passes at once. For others, pieces of the mucus plug may gradually pass over several days.  Your baby moving (dropping) lower in your pelvis to get into position for birth (lightening). When this happens, you may feel more pressure on your bladder and pelvic bone and less pressure on your ribs. This may make it easier to breathe. It may also cause you to need to urinate more often and have problems with bowel movements.  Having "practice contractions," also called Braxton Hicks contractions or false labor. These occur at irregular (unevenly spaced) intervals that are more than 10 minutes apart. False labor contractions are common after exercise or sexual activity. They will stop if you change position, rest, or drink fluids. These contractions are usually mild and do not get stronger over time. They may feel like: ? A backache or back  pain. ? Mild cramps, similar to menstrual cramps. ? Tightening or pressure in your abdomen. Other early symptoms include:  Nausea or loss of appetite.  Diarrhea.  Having a sudden burst of energy, or feeling very tired.  Mood changes.  Having trouble sleeping.   Signs and symptoms that labor has begun Signs that you are in labor may include:  Having contractions that come at regular (evenly spaced) intervals and increase in intensity. This may feel like more intense tightening or pressure in your abdomen that  moves to your back. ? Contractions may also feel like rhythmic pain in your upper thighs or back that comes and goes at regular intervals. ? For first-time mothers, this change in intensity of contractions often occurs at a more gradual pace. ? Women who have given birth before may notice a more rapid progression of contraction changes.  Feeling pressure in the vaginal area.  Your water breaking (rupture of membranes). This is when the sac of fluid that surrounds your baby breaks. Fluid leaking from your vagina may be clear or blood-tinged. Labor usually starts within 24 hours of your water breaking, but it may take longer to begin. ? Some women may feel a sudden gush of fluid. ? Others notice that their underwear repeatedly becomes damp. Follow these instructions at home:  When labor starts, or if your water breaks, call your health care provider or nurse care line. Based on your situation, they will determine when you should go in for an exam.  During early labor, you may be able to rest and manage symptoms at home. Some strategies to try at home include: ? Breathing and relaxation techniques. ? Taking a warm bath or shower. ? Listening to music. ? Using a heating pad on the lower back for pain. If you are directed to use heat:  Place a towel between your skin and the heat source.  Leave the heat on for 20-30 minutes.  Remove the heat if your skin turns bright red. This  is especially important if you are unable to feel pain, heat, or cold. You may have a greater risk of getting burned.   Contact a health care provider if:  Your labor has started.  Your water breaks. Get help right away if:  You have painful, regular contractions that are 5 minutes apart or less.  Labor starts before you are [redacted] weeks along in your pregnancy.  You have a fever.  You have bright red blood coming from your vagina.  You do not feel your baby moving.  You have a severe headache with or without vision problems.  You have severe nausea, vomiting, or diarrhea.  You have chest pain or shortness of breath. These symptoms may represent a serious problem that is an emergency. Do not wait to see if the symptoms will go away. Get medical help right away. Call your local emergency services (911 in the U[place] Do not drive yourself to the hospital. Summary  Labor is your body's natural process of moving your baby and the placenta out of your uterus.  The process of labor usually starts when your baby is full-term, between 76 and 40 weeks of pregnancy.  When labor starts, or if your water breaks, call your health care provider or nurse care line. Based on your situation, they will determine when you should go in for an exam. This information is not intended to replace advice given to you by your health care provider. Make sure you discuss any questions you have with your health care provider. Document Revised: 01/11/2020 Document Reviewed: 01/11/2020 Elsevier Patient Education  2021 Elsevier Inc.        Preeclampsia and Eclampsia Preeclampsia is a serious condition that may develop during pregnancy. This condition involves high blood pressure during pregnancy and causes symptoms such as headaches, vision changes, and increased swelling in the legs, hands, and face. Preeclampsia occurs after 20 weeks of pregnancy. Eclampsia is a seizure that happens from worsening  preeclampsia. Diagnosing and managing preeclampsia early is important.  If not treated early, it can cause serious problems for mother and baby. There is no cure for this condition. However, during pregnancy, delivering the baby may be the best treatment for preeclampsia or eclampsia. For most women, symptoms of preeclampsia and eclampsia go away after giving birth. In rare cases, a woman may develop preeclampsia or eclampsia after giving birth. This usually occurs within 48 hours after childbirth but may occur up to 6 weeks after giving birth. What are the causes? The cause of this condition is not known. What increases the risk? The following factors make you more likely to develop preeclampsia:  Being pregnant for the first time or being pregnant with multiples.  Having had preeclampsia or a condition called hemolysis, elevated liver enzymes, and low platelet count (HELLP)syndrome during a past pregnancy.  Having a family history of preeclampsia.  Being older than age 29.  Being obese.  Becoming pregnant through fertility treatments. Conditions that reduce blood flow or oxygen to your placenta and baby may also increase your risk. These include:  High blood pressure before, during, or immediately following pregnancy.  Kidney disease.  Diabetes.  Blood clotting disorders.  Autoimmune diseases, such as lupus.  Sleep apnea. What are the signs or symptoms? Common symptoms of this condition include:  A severe, throbbing headache that does not go away.  Vision problems, such as blurred or double vision and light sensitivity.  Pain in the stomach, especially the right upper region.  Pain in the shoulder. Other symptoms that may develop as the condition gets worse include:  Sudden weight gain because of fluid buildup in the body. This causes swelling of the face, hands, legs, and feet.  Severe nausea and vomiting.  Urinating less than usual.  Shortness of  breath.  Seizures. How is this diagnosed? Your health care provider will ask you about symptoms and check for signs of preeclampsia during your prenatal visits. You will also have routine tests, including:  Checking your blood pressure.  Urine tests to check for protein.  Blood tests to assess your organ function.  Monitoring your baby's heart rate.  Ultrasounds to check fetal growth.   How is this treated? You and your health care provider will determine the treatment that is best for you. Treatment may include:  Frequent prenatal visits to check for preeclampsia.  Medicine to lower your blood pressure.  Medicine to prevent seizures.  Low-dose aspirin during your pregnancy.  Staying in the hospital, in severe cases. You will be given medicines to control your blood pressure and the amount of fluids in your body.  Delivering your baby. Work with your health care provider to manage any chronic health conditions, such as diabetes or kidney problems. Also, work with your health care provider to manage weight gain during pregnancy. Follow these instructions at home: Eating and drinking  Drink enough fluid to keep your urine pale yellow.  Avoid caffeine. Caffeine may increase blood pressure and heart rate and lead to dehydration.  Reduce the amount of salt that you eat. Lifestyle  Do not use any products that contain nicotine or tobacco. These products include cigarettes, chewing tobacco, and vaping devices, such as e-cigarettes. If you need help quitting, ask your health care provider.  Do not use alcohol or drugs.  Avoid stress as much as possible.  Rest and get plenty of sleep. General instructions  Take over-the-counter and prescription medicines only as told by your health care provider.  When lying down, lie on your left side.  This keeps pressure off your major blood vessels.  When sitting or lying down, raise (elevate) your feet. Try putting pillows underneath  your lower legs.  Exercise regularly. Ask your health care provider what kinds of exercise are best for you.  Check your blood pressure as often as recommended by your health care provider.  Keep all prenatal and follow-up visits. This is important.   Contact a health care provider if:  You have symptoms that may need treatment or closer monitoring. These include: ? Headaches. ? Stomach pain or nausea and vomiting. ? Shoulder pain. ? Vision problems, such as spots in front of your eyes or blurry vision. ? Sudden weight gain or increased swelling in your face, hands, legs, and feet. ? Increased anxiety or feeling of impending doom. ? Signs or symptoms of labor. Get help right away if:  You have any of the following symptoms: ? A seizure. ? Shortness of breath or trouble breathing. ? Trouble speaking or slurred speech. ? Fainting. ? Chest pain. These symptoms may represent a serious problem that is an emergency. Do not wait to see if the symptoms will go away. Get medical help right away. Call your local emergency services (911 in the [place] Do not drive yourself to the hospital. Summary  Preeclampsia is a serious condition that may develop during pregnancy.  Diagnosing and treating preeclampsia early is very important.  Keep all prenatal and follow-up visits. This is important.  Get help right away if you have a seizure, shortness of breath or trouble breathing, trouble speaking or slurred speech, chest pain, or fainting. This information is not intended to replace advice given to you by your health care provider. Make sure you discuss any questions you have with your health care provider. Document Revised: 12/12/2019 Document Reviewed: 12/12/2019 Elsevier Patient Education  2021 [employer].

## 2020-07-29 NOTE — Progress Notes (Signed)
Subjective:  Theresa Lawson is a 30 y.o. [phone] at [redacted]w[redacted]d being seen today for ongoing prenatal care.  She is currently monitored for the following issues for this low-risk pregnancy and has History of gestational hypertension; Close exposure to COVID-19 virus; and Supervision of normal pregnancy, antepartum on their problem list.  Patient reports no complaints.  Contractions: Not present. Vag. Bleeding: None.  Movement: Present. Denies leaking of fluid.   The following portions of the patient's history were reviewed and updated as appropriate: allergies, current medications, past family history, past medical history, past social history, past surgical history and problem list. Problem list updated.  Objective:   Vitals:   07/29/20 1114  BP: 136/83  Pulse: (!) 111  Weight: 183 lb (83 kg)    Fetal Status: Fetal Heart Rate (bpm): 158 Fundal Height: 38 cm Movement: Present  Presentation: Undeterminable  General:  Alert, oriented and cooperative. Patient is in no acute distress.  Skin: Skin is warm and dry. No rash noted.   Cardiovascular: Normal heart rate noted  Respiratory: Normal respiratory effort, no problems with respiration noted  Abdomen: Soft, gravid, appropriate for gestational age. Pain/Pressure: Absent     Pelvic: Vag. Bleeding: None     Cervical exam performed Dilation: 1 Effacement (%): 0 Station: Ballotable  Extremities: Normal range of motion.  Edema: None  Mental Status: Normal mood and affect. Normal behavior. Normal judgment and thought content.   Urinalysis:      Assessment and Plan:  Pregnancy: [id number] at [redacted]w[redacted]d  1. Supervision of other normal pregnancy, antepartum - peds list given - Cervicovaginal ancillary only( [place]) - Strep Gp B NAA  2. History of gestational hypertension -BP today 136/83 --Reviewed warning blood pressure values (systolic = / > 140 and/or diastolic =/> 90). Explained that, if blood pressure is elevated, she should sit down, rest, and  eat/drink something. If still elevated 15 minutes later, and she is greater than 20 weeks, she should call clinic or come to MAU. She should come to MAU if she has elevated pressures and any of the following:  -headache not relieved with tylenol, rest, hydration -blurry vision, floating spots in her vision -sudden full-body edema or facial edema -RUQ pain that is constant. -chest pain or shortness of breath -new onset or sudden worsening of nausea and vomiting These symptoms may indicate that her blood pressure is worsening and she may be developing gestational hypertension or pre-eclampsia, which is an emergency.   3. [redacted] weeks gestation of pregnancy  Term labor symptoms and general obstetric precautions including but not limited to vaginal bleeding, contractions, leaking of fluid and fetal movement were reviewed in detail with the patient. I discussed the assessment and treatment plan with the patient. The patient was provided an opportunity to ask questions and all were answered. The patient agreed with the plan and demonstrated an understanding of the instructions. The patient was advised to call back or seek an in-person office evaluation/go to MAU at [facility] for any urgent or concerning symptoms. Please refer to After Visit Summary for other counseling recommendations.  Return in about 1 week (around 08/05/2020) for in-person LOB/APP OK.   Montserrat Shek, Odie Sera, NP

## 2020-07-30 LAB — CERVICOVAGINAL ANCILLARY ONLY
Chlamydia: NEGATIVE
Comment: NEGATIVE
Comment: NORMAL
Neisseria Gonorrhea: NEGATIVE

## 2020-07-31 LAB — STREP GP B NAA: Strep Gp B NAA: NEGATIVE

## 2020-08-05 ENCOUNTER — Encounter: Payer: Medicaid Other | Admitting: Obstetrics

## 2020-08-06 ENCOUNTER — Encounter: Payer: Self-pay | Admitting: Obstetrics & Gynecology

## 2020-08-06 ENCOUNTER — Other Ambulatory Visit: Payer: Self-pay

## 2020-08-06 ENCOUNTER — Ambulatory Visit (INDEPENDENT_AMBULATORY_CARE_PROVIDER_SITE_OTHER): Payer: Medicaid Other | Admitting: Obstetrics & Gynecology

## 2020-08-06 DIAGNOSIS — Z3480 Encounter for supervision of other normal pregnancy, unspecified trimester: Secondary | ICD-10-CM

## 2020-08-06 NOTE — Progress Notes (Signed)
Pt reports fetal movement, denies pain.  

## 2020-08-06 NOTE — Patient Instructions (Signed)

## 2020-08-06 NOTE — Progress Notes (Signed)
   PRENATAL VISIT NOTE  Subjective:  Theresa Lawson is a 30 y.o. [phone] at [redacted]w[redacted]d being seen today for ongoing prenatal care.  She is currently monitored for the following issues for this high-risk pregnancy and has History of gestational hypertension; Close exposure to COVID-19 virus; and Supervision of normal pregnancy, antepartum on their problem list.  Patient reports occasional contractions.  Contractions: Not present. Vag. Bleeding: None.  Movement: Present. Denies leaking of fluid.   The following portions of the patient's history were reviewed and updated as appropriate: allergies, current medications, past family history, past medical history, past social history, past surgical history and problem list.   Objective:   Vitals:   08/06/20 0933  BP: 123/80  Pulse: 87  Weight: 184 lb 8 oz (83.7 kg)    Fetal Status: Fetal Heart Rate (bpm): 137   Movement: Present     General:  Alert, oriented and cooperative. Patient is in no acute distress.  Skin: Skin is warm and dry. No rash noted.   Cardiovascular: Normal heart rate noted  Respiratory: Normal respiratory effort, no problems with respiration noted  Abdomen: Soft, gravid, appropriate for gestational age.  Pain/Pressure: Absent     Pelvic: Cervical exam deferred        Extremities: Normal range of motion.  Edema: None  Mental Status: Normal mood and affect. Normal behavior. Normal judgment and thought content.   Assessment and Plan:  Pregnancy: [id number] at [redacted]w[redacted]d 1. Supervision of other normal pregnancy, antepartum Discussed possible elective IOL, she doesn't want to go postdates  Term labor symptoms and general obstetric precautions including but not limited to vaginal bleeding, contractions, leaking of fluid and fetal movement were reviewed in detail with the patient. Please refer to After Visit Summary for other counseling recommendations.   Return in about 1 week (around 08/13/2020).  No future appointments.  Scheryl Darter, MD

## 2020-08-13 ENCOUNTER — Other Ambulatory Visit: Payer: Self-pay

## 2020-08-13 ENCOUNTER — Encounter: Payer: Self-pay | Admitting: Obstetrics

## 2020-08-13 ENCOUNTER — Ambulatory Visit (INDEPENDENT_AMBULATORY_CARE_PROVIDER_SITE_OTHER): Payer: Medicaid Other | Admitting: Obstetrics

## 2020-08-13 VITALS — BP 127/83 | HR 86 | Wt 184.0 lb

## 2020-08-13 DIAGNOSIS — Z3480 Encounter for supervision of other normal pregnancy, unspecified trimester: Secondary | ICD-10-CM

## 2020-08-13 NOTE — Progress Notes (Signed)
Subjective:  Theresa Lawson is a 30 y.o. [id number] at [redacted]w[redacted]d being seen today for ongoing prenatal care.  She is currently monitored for the following issues for this low-risk pregnancy and has History of gestational hypertension; Close exposure to COVID-19 virus; and Supervision of normal pregnancy, antepartum on their problem list.  Patient reports no complaints.  Contractions: Irregular. Vag. Bleeding: None.  Movement: Present. Denies leaking of fluid.   The following portions of the patient's history were reviewed and updated as appropriate: allergies, current medications, past family history, past medical history, past social history, past surgical history and problem list. Problem list updated.  Objective:   Vitals:   08/13/20 1002  BP: 127/83  Pulse: 86  Weight: 184 lb (83.5 kg)    Fetal Status:     Movement: Present     General:  Alert, oriented and cooperative. Patient is in no acute distress.  Skin: Skin is warm and dry. No rash noted.   Cardiovascular: Normal heart rate noted  Respiratory: Normal respiratory effort, no problems with respiration noted  Abdomen: Soft, gravid, appropriate for gestational age. Pain/Pressure: Present     Pelvic:  Cervical exam deferred        Extremities: Normal range of motion.     Mental Status: Normal mood and affect. Normal behavior. Normal judgment and thought content.   Urinalysis:      Assessment and Plan:  Pregnancy: [id number] at [redacted]w[redacted]d  1. Supervision of other normal pregnancy, antepartum   Term labor symptoms and general obstetric precautions including but not limited to vaginal bleeding, contractions, leaking of fluid and fetal movement were reviewed in detail with the patient. Please refer to After Visit Summary for other counseling recommendations.   Return in about 1 week (around 08/20/2020) for ROB.   Brock Bad, MD  08/13/20

## 2020-08-20 ENCOUNTER — Ambulatory Visit (INDEPENDENT_AMBULATORY_CARE_PROVIDER_SITE_OTHER): Payer: Medicaid Other

## 2020-08-20 ENCOUNTER — Other Ambulatory Visit: Payer: Self-pay

## 2020-08-20 VITALS — BP 120/85 | HR 100 | Wt 180.0 lb

## 2020-08-20 DIAGNOSIS — Z3480 Encounter for supervision of other normal pregnancy, unspecified trimester: Secondary | ICD-10-CM

## 2020-08-20 DIAGNOSIS — Z3A39 39 weeks gestation of pregnancy: Secondary | ICD-10-CM

## 2020-08-20 NOTE — Progress Notes (Signed)
Pt states would like cervix check today.

## 2020-08-20 NOTE — Progress Notes (Signed)
HIGH-RISK PREGNANCY OFFICE VISIT  Patient name: Theresa Lawson MRN [id number]  Date of birth: [birth year] Chief Complaint:   Routine Prenatal Visit  Subjective:   Theresa Lawson is a 30 y.o. [id number] female at [redacted]w[redacted]d with an Estimated Date of Delivery: 08/26/20 being seen today for ongoing management of a high-risk pregnancy aeb has History of gestational hypertension; Close exposure to COVID-19 virus; and Supervision of normal pregnancy, antepartum on their problem list.  Patient presents today with no complaints.  Patient endorses fetal movement. Patient denies abdominal cramping or contractions.  Patient denies vaginal concerns including abnormal discharge, leaking of fluid, and bleeding.  Contractions: Irregular. Vag. Bleeding: None.  Movement: Present.   Patient questions if she can be scheduled for induction this weekend to help with childcare. Patient also requests membrane stripping/sweep today.   Reviewed past medical,surgical, social, obstetrical and family history as well as problem list, medications and allergies.  Objective   Vitals:   08/20/20 1043  BP: 120/85  Pulse: 100  Weight: 180 lb (81.6 kg)  Body mass index is 30.9 kg/m.  Total Weight Gain:5 lb (2.268 kg)         Physical Examination:   General appearance: Well appearing, and in no distress  Mental status: Alert, oriented to person, place, and time  Skin: Warm & dry  Cardiovascular: Normal heart rate noted  Respiratory: Normal respiratory effort, no distress  Abdomen: Soft, gravid, nontender, AGA with Fundal Height: 37 cm  Pelvic: Cervical exam performed  Dilation: 3.5 Effacement (%): 0 Station: -3 Presentation: Vertex  Extremities:    Fetal Status: Fetal Heart Rate (bpm): 140  Movement: Present   No results found for this or any previous visit (from the past 24 hour(s)).  Assessment & Plan:  High-risk pregnancy of a 30 y.o., [id number] at [redacted]w[redacted]d with an Estimated Date of Delivery: 08/26/20   1. Supervision of  other normal pregnancy, antepartum -Anticipatory guidance for upcoming appts. -Patient to next appt in 1 weeks for an in-person visit with NST. -Discussed patient desire to have no birth control.   -Informed that provider supports and respects her decision.  Encouraged to be a self-advocate regarding her reproductive care/status.   2. [redacted] weeks gestation of pregnancy -Doing well overall. -Discussed desires for IOL this weekend and schedule reviewed. *Informed that due to limited staffing and previous scheduled inductions this weekend is not possible. *Given option for IOL on next available which would be Friday may 27th at [facility]. *Patient agreeable and orders for admission placed. -Reviewed risks and benefits of membrane stripping including: *Abdominal cramping, contractions, ROM, and vaginal bleeding.  *Discussed how this is considered induction method as it causes releasing of prostaglandins which can stimulate labor resulting in delivery.  *Cautioned that membrane stripping/sweeping is not guaranteed to cause labor onset. -Patient verbalized understanding and wishes to proceed with procedure.  -VE and membrane stripping performed; Patient tolerated well. -Encouraged to go home and engage in sexual activity if possible.        Meds: No orders of the defined types were placed in this encounter.  Labs/procedures today:  Lab Orders  No laboratory test(s) ordered today     Reviewed: Preterm labor symptoms and general obstetric precautions including but not limited to vaginal bleeding, contractions, leaking of fluid and fetal movement were reviewed in detail with the patient.  All questions were answered.  Follow-up: Return in about 1 week (around 08/27/2020) for HROB with NST.  No orders of the defined types were  placed in this encounter.  Cherre Robins MSN, CNM 08/20/2020

## 2020-08-21 ENCOUNTER — Other Ambulatory Visit: Payer: Self-pay | Admitting: Advanced Practice Midwife

## 2020-08-21 ENCOUNTER — Telehealth (HOSPITAL_COMMUNITY): Payer: Self-pay | Admitting: *Deleted

## 2020-08-21 ENCOUNTER — Encounter (HOSPITAL_COMMUNITY): Payer: Self-pay | Admitting: *Deleted

## 2020-08-21 NOTE — Telephone Encounter (Signed)
Preadmission screen  

## 2020-08-25 ENCOUNTER — Other Ambulatory Visit: Payer: Self-pay

## 2020-08-26 ENCOUNTER — Encounter: Payer: Medicaid Other | Admitting: Obstetrics

## 2020-08-26 ENCOUNTER — Inpatient Hospital Stay (HOSPITAL_COMMUNITY): Admit: 2020-08-26 | Payer: Self-pay

## 2020-08-26 ENCOUNTER — Other Ambulatory Visit (HOSPITAL_COMMUNITY): Admission: RE | Admit: 2020-08-26 | Payer: Medicaid Other | Source: Ambulatory Visit

## 2020-08-27 ENCOUNTER — Encounter: Payer: Medicaid Other | Admitting: Obstetrics and Gynecology

## 2020-08-28 ENCOUNTER — Other Ambulatory Visit: Payer: Self-pay

## 2020-08-28 ENCOUNTER — Encounter (HOSPITAL_COMMUNITY): Payer: Self-pay | Admitting: Family Medicine

## 2020-08-28 ENCOUNTER — Inpatient Hospital Stay (HOSPITAL_COMMUNITY)
Admission: AD | Admit: 2020-08-28 | Discharge: 2020-08-29 | DRG: 807 | Disposition: A | Discharge status: Home / Self Care | Payer: Medicaid Other | Attending: Family Medicine | Admitting: Family Medicine

## 2020-08-28 ENCOUNTER — Inpatient Hospital Stay (HOSPITAL_COMMUNITY): Payer: Medicaid Other

## 2020-08-28 DIAGNOSIS — J45909 Unspecified asthma, uncomplicated: Secondary | ICD-10-CM | POA: Diagnosis present

## 2020-08-28 DIAGNOSIS — Z3A40 40 weeks gestation of pregnancy: Secondary | ICD-10-CM

## 2020-08-28 DIAGNOSIS — O26893 Other specified pregnancy related conditions, third trimester: Secondary | ICD-10-CM | POA: Diagnosis present

## 2020-08-28 DIAGNOSIS — Z8759 Personal history of other complications of pregnancy, childbirth and the puerperium: Secondary | ICD-10-CM

## 2020-08-28 DIAGNOSIS — O9952 Diseases of the respiratory system complicating childbirth: Secondary | ICD-10-CM | POA: Diagnosis present

## 2020-08-28 DIAGNOSIS — Z20822 Contact with and (suspected) exposure to covid-19: Secondary | ICD-10-CM | POA: Diagnosis present

## 2020-08-28 DIAGNOSIS — Z87891 Personal history of nicotine dependence: Secondary | ICD-10-CM | POA: Diagnosis not present

## 2020-08-28 LAB — TYPE AND SCREEN
ABO/RH(D): O POS
Antibody Screen: NEGATIVE

## 2020-08-28 LAB — RESP PANEL BY RT-PCR (FLU A&B, COVID) ARPGX2
Influenza A by PCR: NEGATIVE
Influenza B by PCR: NEGATIVE
SARS Coronavirus 2 by RT PCR: NEGATIVE

## 2020-08-28 LAB — CBC
HCT: 33.5 % — ABNORMAL LOW (ref 36.0–46.0)
Hemoglobin: 11.2 g/dL — ABNORMAL LOW (ref 12.0–15.0)
MCH: 29.1 pg (ref 26.0–34.0)
MCHC: 33.4 g/dL (ref 30.0–36.0)
MCV: 87 fL (ref 80.0–100.0)
Platelets: 207 10*3/uL (ref 150–400)
RBC: 3.85 MIL/uL — ABNORMAL LOW (ref 3.87–5.11)
RDW: 13.9 % (ref 11.5–15.5)
WBC: 11.1 10*3/uL — ABNORMAL HIGH (ref 4.0–10.5)
nRBC: 0 % (ref 0.0–0.2)

## 2020-08-28 LAB — RPR: RPR Ser Ql: NONREACTIVE

## 2020-08-28 MED ORDER — ONDANSETRON HCL 4 MG/2ML IJ SOLN: 4.0000 mg | INTRAMUSCULAR | Status: DC | PRN

## 2020-08-28 MED ORDER — LIDOCAINE HCL (PF) 1 % IJ SOLN: 30.0000 mL | INTRAMUSCULAR | Status: DC | PRN

## 2020-08-28 MED ORDER — PRENATAL MULTIVITAMIN CH
1.0000 | ORAL_TABLET | Freq: Every day | ORAL | Status: DC
  Administered 2020-08-29: 1 via ORAL
  Filled 2020-08-28: qty 1

## 2020-08-28 MED ORDER — OXYTOCIN-SODIUM CHLORIDE 30-0.9 UT/500ML-% IV SOLN
1.0000 m[IU]/min | INTRAVENOUS | Status: DC
  Administered 2020-08-28: 2 m[IU]/min via INTRAVENOUS
  Filled 2020-08-28: qty 500

## 2020-08-28 MED ORDER — SENNOSIDES-DOCUSATE SODIUM 8.6-50 MG PO TABS
2.0000 | ORAL_TABLET | Freq: Every day | ORAL | Status: DC
  Administered 2020-08-29: 2 via ORAL
  Filled 2020-08-28: qty 2

## 2020-08-28 MED ORDER — WITCH HAZEL-GLYCERIN EX PADS: 1.0000 "application " | MEDICATED_PAD | CUTANEOUS | Status: DC | PRN

## 2020-08-28 MED ORDER — LACTATED RINGERS IV SOLN
500.0000 mL | INTRAVENOUS | Status: DC | PRN
Start: 2020-08-28 — End: 2020-08-28

## 2020-08-28 MED ORDER — ACETAMINOPHEN 325 MG PO TABS
650.0000 mg | ORAL_TABLET | ORAL | Status: DC | PRN
  Administered 2020-08-28 – 2020-08-29 (×5): 650 mg via ORAL
  Filled 2020-08-28 (×5): qty 2

## 2020-08-28 MED ORDER — OXYTOCIN BOLUS FROM INFUSION
333.0000 mL | Freq: Once | INTRAVENOUS | Status: AC
  Administered 2020-08-28: 333 mL via INTRAVENOUS

## 2020-08-28 MED ORDER — LACTATED RINGERS IV SOLN: INTRAVENOUS | Status: DC

## 2020-08-28 MED ORDER — OXYTOCIN-SODIUM CHLORIDE 30-0.9 UT/500ML-% IV SOLN
2.5000 [IU]/h | INTRAVENOUS | Status: DC
  Administered 2020-08-28: 2.5 [IU]/h via INTRAVENOUS

## 2020-08-28 MED ORDER — ONDANSETRON HCL 4 MG/2ML IJ SOLN: 4.0000 mg | Freq: Four times a day (QID) | INTRAMUSCULAR | Status: DC | PRN

## 2020-08-28 MED ORDER — FENTANYL-BUPIVACAINE-NACL 0.5-0.125-0.9 MG/250ML-% EP SOLN
12.0000 mL/h | EPIDURAL | Status: DC | PRN
Start: 2020-08-28 — End: 2020-08-28

## 2020-08-28 MED ORDER — IBUPROFEN 600 MG PO TABS
600.0000 mg | ORAL_TABLET | Freq: Four times a day (QID) | ORAL | Status: DC
  Administered 2020-08-28 – 2020-08-29 (×5): 600 mg via ORAL
  Filled 2020-08-28 (×5): qty 1

## 2020-08-28 MED ORDER — TERBUTALINE SULFATE 1 MG/ML IJ SOLN: 0.2500 mg | Freq: Once | INTRAMUSCULAR | Status: DC | PRN

## 2020-08-28 MED ORDER — FENTANYL CITRATE (PF) 100 MCG/2ML IJ SOLN
50.0000 ug | INTRAMUSCULAR | Status: DC | PRN
  Administered 2020-08-28: 100 ug via INTRAVENOUS
  Filled 2020-08-28: qty 2

## 2020-08-28 MED ORDER — ACETAMINOPHEN 325 MG PO TABS: 650.0000 mg | ORAL_TABLET | ORAL | Status: DC | PRN

## 2020-08-28 MED ORDER — SIMETHICONE 80 MG PO CHEW: 80.0000 mg | CHEWABLE_TABLET | ORAL | Status: DC | PRN

## 2020-08-28 MED ORDER — COCONUT OIL OIL: 1.0000 "application " | TOPICAL_OIL | Status: DC | PRN

## 2020-08-28 MED ORDER — PHENYLEPHRINE 40 MCG/ML (10ML) SYRINGE FOR IV PUSH (FOR BLOOD PRESSURE SUPPORT): 80.0000 ug | PREFILLED_SYRINGE | INTRAVENOUS | Status: DC | PRN

## 2020-08-28 MED ORDER — DIPHENHYDRAMINE HCL 25 MG PO CAPS
25.0000 mg | ORAL_CAPSULE | Freq: Four times a day (QID) | ORAL | Status: DC | PRN
Start: 2020-08-28 — End: 2020-08-29

## 2020-08-28 MED ORDER — EPHEDRINE 5 MG/ML INJ: 10.0000 mg | INTRAVENOUS | Status: DC | PRN

## 2020-08-28 MED ORDER — BENZOCAINE-MENTHOL 20-0.5 % EX AERO: 1.0000 "application " | INHALATION_SPRAY | CUTANEOUS | Status: DC | PRN

## 2020-08-28 MED ORDER — ONDANSETRON HCL 4 MG PO TABS: 4.0000 mg | ORAL_TABLET | ORAL | Status: DC | PRN

## 2020-08-28 MED ORDER — TETANUS-DIPHTH-ACELL PERTUSSIS 5-2.5-18.5 LF-MCG/0.5 IM SUSY: 0.5000 mL | PREFILLED_SYRINGE | Freq: Once | INTRAMUSCULAR | Status: DC

## 2020-08-28 MED ORDER — SOD CITRATE-CITRIC ACID 500-334 MG/5ML PO SOLN: 30.0000 mL | ORAL | Status: DC | PRN

## 2020-08-28 MED ORDER — DIPHENHYDRAMINE HCL 50 MG/ML IJ SOLN: 12.5000 mg | INTRAMUSCULAR | Status: DC | PRN

## 2020-08-28 MED ORDER — LACTATED RINGERS IV SOLN: 500.0000 mL | Freq: Once | INTRAVENOUS | Status: DC

## 2020-08-28 MED ORDER — DIBUCAINE (PERIANAL) 1 % EX OINT: 1.0000 "application " | TOPICAL_OINTMENT | CUTANEOUS | Status: DC | PRN

## 2020-08-28 MED ORDER — ZOLPIDEM TARTRATE 5 MG PO TABS: 5.0000 mg | ORAL_TABLET | Freq: Every evening | ORAL | Status: DC | PRN

## 2020-08-28 MED ORDER — MISOPROSTOL 50MCG HALF TABLET
50.0000 ug | ORAL_TABLET | Freq: Once | ORAL | Status: AC
  Administered 2020-08-28: 50 ug via BUCCAL
  Filled 2020-08-28: qty 1

## 2020-08-28 MED ORDER — OXYCODONE HCL 5 MG PO TABS
10.0000 mg | ORAL_TABLET | Freq: Once | ORAL | Status: AC
  Administered 2020-08-28: 10 mg via ORAL
  Filled 2020-08-28: qty 2

## 2020-08-28 NOTE — H&P (Signed)
Theresa Lawson is a 30 y.o. female [phone] with IUP at [redacted]w[redacted]d presenting for IOL for elective. [facility] Prenatal History/Complications:     Past Medical History: Past Medical History:  Diagnosis Date  . Asthma    childhood  . Eczema   . Headache(784.0)    SINCE CURRENT PREGNANCY  . Infection    YEAST INF;NOT FREQ  . Irregular periods/menstrual cycles 12/18/2011  . Polyhydramnios 2010   WAS GETTING REG NST'S DURING PREG  . Pregnancy induced hypertension 2010   NO MEDS;HAD INCREASED STESS  . Pyelonephritis    DURING AND BEFORE PREG;WAS HOSPITALIZED PREVIOUSLY  . Seasonal allergies   . Unsure LMP 12/18/2011   EDC by [redacted]w[redacted]d u/s at [facility]    Past Surgical History: Past Surgical History:  Procedure Laterality Date  . NO PAST SURGERIES    . VAGINAL DELIVERY      Obstetrical History: OB History    Gravida  6   Para  4   Term  4   Preterm  0   AB  1   Living  4     SAB  0   IAB  1   Ectopic  0   Multiple  0   Live Births  4           Social History: Social History   Socioeconomic History  . Marital status: Single    Spouse name: Not on file  . Number of children: 4  . Years of education: 57  . Highest education level: Not on file  Occupational History  . 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 Smoking status: Former Smoker    Types: Cigarettes    Quit date: 05/20/2011    Years since quitting: 9.2  . Smokeless tobacco: Never Used  Vaping Use  . Vaping Use: Never used  Substance and Sexual Activity  . Alcohol use: Not Currently  . Drug use: Not Currently    Types: Marijuana    Comment: last used a week ago  . Sexual activity: Yes    Partners: Male    Birth control/protection: None  Other Topics Concern  . Not on file  Social History Narrative  . Not on file   Social Determinants of Health   Financial Resource Strain: Not on file  Food Insecurity: Not on file  Transportation Needs: Not on file  Physical  Activity: Not on file  Stress: Not on file  Social Connections: Not on file    Family History: Family History  Problem Relation Age of Onset  . Asthma Sister   . Asthma Son   . Asthma Brother        X 2  . Nephrolithiasis Mother        HAS HAD 2-3 SURGERIES TO REMOVE  . Kidney disease Mother     Allergies: No Known Allergies  Medications Prior to Admission  Medication Sig Dispense Refill Last Dose  . acetaminophen (TYLENOL) 325 MG tablet Take 2 tablets (650 mg total) by mouth every 4 (four) hours as needed (for pain scale < 4). (Patient not taking: No sig reported) 30 tablet 0   . albuterol (VENTOLIN HFA) 108 (90 Base) MCG/ACT inhaler Inhale 2 puffs into the lungs every 4 (four) hours as needed for shortness of breath. 8 g 0   . aspirin 81 MG chewable tablet Chew 1 tablet (81 mg total) by mouth daily. (Patient not taking: No sig reported) 30 tablet 5   .  calcium carbonate (TUMS - DOSED IN MG ELEMENTAL CALCIUM) 500 MG chewable tablet Chew 1-2 tablets by mouth as needed for indigestion or heartburn.     Jae Dire Bandages & Supports (COMFORT FIT MATERNITY SUPP SM) MISC Wear as directed. (Patient not taking: No sig reported) 1 each 0   . predniSONE (STERAPRED UNI-PAK 21 TAB) 10 MG (21) TBPK tablet Take as directed. (Patient not taking: Reported on 06/11/2020) 21 tablet 0   . Prenatal Vit-Fe Fumarate-FA (PRENATAL VITAMINS) 28-0.8 MG TABS Take 1 tablet by mouth daily. 60 tablet 3   . promethazine (PHENERGAN) 25 MG tablet Take 1 tablet (25 mg total) by mouth every 6 (six) hours as needed for nausea or vomiting. (Patient not taking: Reported on 03/17/2020) 30 tablet 1   . triamcinolone (KENALOG) 0.1 % Apply 1 application topically 2 (two) times daily. 45 g 0         Review of Systems   Constitutional: Negative for fever and chills Eyes: Negative for visual disturbances Respiratory: Negative for shortness of breath, dyspnea Cardiovascular: Negative for chest pain or palpitations   Gastrointestinal: Negative for abdominal pain, vomiting, diarrhea and constipation.   Genitourinary: Negative for dysuria and urgency Musculoskeletal: Negative for back pain, joint pain, myalgias  Neurological: Negative for dizziness and headaches      Last menstrual period 11/20/2019, not currently breastfeeding. General appearance: alert, cooperative and no distress Lungs: normal respiratory effort Heart: regular rate and rhythm Abdomen: soft, non-tender; bowel sounds normal Extremities: Homans sign is negative, no sign of DVT DTR's 2+ Presentation: cephalic Fetal monitoring  150 baseline, mod variability, + accels, no decels.  No ctx. Cx 3.5/thick/-3 Uterine activity  None      Prenatal labs: ABO, Rh: O/Positive/-- (12/14 1110) Antibody: Negative (12/14 1110) Rubella: immune RPR: Non Reactive (03/10 1016)  HBsAg: Negative (12/14 1110)  HIV: Non Reactive (03/10 1016)  GBS: Negative/-- (04/27 1121)   Nursing Staff Provider  Office Location  [facility] Dating  LMP c/w 17 wk sono  Language   ENGLISH Anatomy US  Incomplete; normal   Flu Vaccine  Declined 05-14-20 Genetic Screen  NIPS:low risks   AFP: neg  TDaP Vaccine   Declined 06/11/2020 Hgb A1C or  GTT   Third trimester  Glucose, Fasting 65 - 91 mg/dL 78     Glucose, 1 hour 65 - 179 mg/dL 287      Glucose, 2 hour 65 - 152 mg/dL 867       COVID Vaccine  COMPLETED   LAB RESULTS   Rhogam  o positive  Blood Type O/Positive/-- (12/14 1110)   Feeding Plan  BOTTLE, some breast Antibody Negative (12/14 1110)  Contraception  declines  Rubella 8.53 (12/14 1110)  Circumcision  YES, outpt RPR Non Reactive (03/10 1016)   Pediatrician   UNSURE, info given HBsAg Negative (12/14 1110)   Support Person  FOB HCVAb Negative   Prenatal Classes  HIV Non Reactive (03/10 1016)     BTL Consent  GBS Negative/-- (04/27 1121)Negative   VBAC Consent  Pap Negative 03/2020    Hgb Electro  AA  BP Cuff Already has cuff CF neg    SMA 3 copies     Waterbirth  [ ]  Class [ ]  Consent [ ]  CNM visit    Induction  [ ]  Orders Entered [ ] Foley Y/N    Prenatal Transfer Tool  Maternal Diabetes: No Genetic Screening: Normal Maternal Ultrasounds/Referrals: Normal Fetal Ultrasounds or other Referrals:  None Maternal Substance Abuse:  No Significant Maternal Medications:  None Significant Maternal Lab Results: Group B Strep negative    No results found for this or any previous visit (from the past 24 hour(s)).  Assessment: CYRAH MCLAMB is a 30 y.o. [phone] with an IUP at [redacted]w[redacted]d presenting for IOL for elective.  Plan: #Labor: Cytotec->pitocin #Pain:  Per request #FWB Cat 1 #ID: GBS: neg  Jacklyn Shell 08/28/2020, 12:29 AM

## 2020-08-28 NOTE — Progress Notes (Signed)
Patient Vitals for the past 4 hrs:  BP Temp Temp src Pulse Resp  08/28/20 0655 121/84 -- -- 80 16  08/28/20 0502 (!) 112/50 98.2 F (36.8 C) Oral 84 16   Started on Pitocin around 0500.  Ctx mild, q 2-3 minutes. FHR Cat 1.  Continue present mgt.

## 2020-08-28 NOTE — Discharge Summary (Signed)
Postpartum Discharge Summary    Patient Name: Theresa Lawson DOB: [birth year] MRN: [id number]  Date of admission: 08/28/2020 Delivery date:08/28/2020  Delivering provider: Dulce Sellar B  Date of discharge: 08/29/2020  Admitting diagnosis: Indication for care or intervention in labor or delivery [O75.9] Intrauterine pregnancy: [redacted]w[redacted]d    Secondary diagnosis:  Principal Problem:   Vaginal delivery Active Problems:   History of gestational hypertension   Indication for care or intervention in labor or delivery  Additional problems: none    Discharge diagnosis: Term Pregnancy Delivered                                              Post partum procedures:none Augmentation: Pitocin Complications: None  Hospital course: Induction of Labor With Vaginal Delivery   30y.o. yo [id number] admitted to the hospital 08/28/2020 for induction of labor.  Indication for induction: Elective.  Patient had an uncomplicated labor course as follows: Membrane Rupture Time/Date: 10:00 AM ,08/28/2020   Delivery Method:Vaginal, Spontaneous  Episiotomy: None  Lacerations:  None  Details of delivery can be found in separate delivery note.  Patient had a routine postpartum course. Patient is discharged home 08/29/20.  Newborn Data: Birth date:08/28/2020  Birth time:10:23 AM  Gender:Female  Living status:Living  Apgars:9 ,10  Weight:3229 g   Magnesium Sulfate received: No BMZ received: No Rhophylac:N/A MMR:N/A T-DaP:declined Flu: No Transfusion:No  Physical exam  Vitals:   08/28/20 1604 08/28/20 1959 08/29/20 0000 08/29/20 0511  BP: 134/86 122/84 119/86 110/80  Pulse: 61 65 75 89  Resp: '20 16 18 18  ' Temp: 97.9 F (36.6 C) 97.7 F (36.5 C) 98.4 F (36.9 C) (!) 97.5 F (36.4 C)  TempSrc: Oral Oral Oral Oral  SpO2:   100% 100%  Weight:      Height:       General: alert, cooperative and no distress Lochia: appropriate Uterine Fundus: firm Incision: N/A DVT Evaluation: No  evidence of DVT seen on physical exam. No cords or calf tenderness. No significant calf/ankle edema. Labs: Lab Results  Component Value Date   WBC 11.1 (H) 08/28/2020   HGB 11.2 (L) 08/28/2020   HCT 33.5 (L) 08/28/2020   MCV 87.0 08/28/2020   PLT 207 08/28/2020   CMP Latest Ref Rng & Units 12/31/2018  Glucose 70 - 99 mg/dL 104(H)  BUN 6 - 20 mg/dL 5(L)  Creatinine 0.44 - 1.00 mg/dL 0.61  Sodium 135 - 145 mmol/L 136  Potassium 3.5 - 5.1 mmol/L 3.8  Chloride 98 - 111 mmol/L 108  CO2 22 - 32 mmol/L 19(L)  Calcium 8.9 - 10.3 mg/dL 8.9  Total Protein 6.5 - 8.1 g/dL 5.8(L)  Total Bilirubin 0.3 - 1.2 mg/dL 0.4  Alkaline Phos 38 - 126 U/L 138(H)  AST 15 - 41 U/L 17  ALT 0 - 44 U/L 13   Edinburgh Score: Edinburgh Postnatal Depression Scale Screening Tool 08/28/2020  I have been able to laugh and see the funny side of things. 0  I have looked forward with enjoyment to things. 0  I have blamed myself unnecessarily when things went wrong. 1  I have been anxious or worried for no good reason. 0  I have felt scared or panicky for no good reason. 0  Things have been getting on top of me. 1  I have been so  unhappy that I have had difficulty sleeping. 0  I have felt sad or miserable. 1  I have been so unhappy that I have been crying. 0  The thought of harming myself has occurred to me. 0  Edinburgh Postnatal Depression Scale Total 3     After visit meds:  Allergies as of 08/29/2020   No Known Allergies     Medication List    STOP taking these medications   aspirin 81 MG chewable tablet   calcium carbonate 500 MG chewable tablet Commonly known as: TUMS - dosed in mg elemental calcium   Comfort Fit Maternity Supp Sm Misc   predniSONE 10 MG (21) Tbpk tablet Commonly known as: STERAPRED UNI-PAK 21 TAB   promethazine 25 MG tablet Commonly known as: PHENERGAN   triamcinolone cream 0.1 % Commonly known as: KENALOG     TAKE these medications   acetaminophen 325 MG  tablet Commonly known as: Tylenol Take 2 tablets (650 mg total) by mouth every 6 (six) hours as needed for mild pain, moderate pain, fever or headache (for pain scale < 4). What changed:   when to take this  reasons to take this   albuterol 108 (90 Base) MCG/ACT inhaler Commonly known as: VENTOLIN HFA Inhale 2 puffs into the lungs every 4 (four) hours as needed for shortness of breath.   coconut oil Oil Apply 1 application topically as needed (nipple pain).   ibuprofen 600 MG tablet Commonly known as: ADVIL Take 1 tablet (600 mg total) by mouth every 6 (six) hours.   Prenatal Vitamins 28-0.8 MG Tabs Take 1 tablet by mouth daily.        Discharge home in stable condition Infant Feeding: Both Infant Disposition:home with mother Discharge instruction: per After Visit Summary and Postpartum booklet. Activity: Advance as tolerated. Pelvic rest for 6 weeks.  Diet: routine diet Future Appointments: Future Appointments  Date Time Provider [place]  09/25/2020 11:15 AM Rasch, Artist Pais, NP [place] None   Follow up Visit:  Please schedule this patient for a Virtual postpartum visit in 4 weeks with the following provider: Any provider. Additional Postpartum F/U:none  Low risk pregnancy complicated by: none Delivery mode:  Vaginal, Spontaneous  Anticipated Birth Control:  declines s/p counseling  Cici Rodriges, Gildardo Cranker, MD OB Fellow, Faculty Practice 08/29/2020 7:05 AM

## 2020-08-29 MED ORDER — IBUPROFEN 600 MG PO TABS: 600.0000 mg | ORAL_TABLET | Freq: Four times a day (QID) | ORAL | 0 refills | Status: DC

## 2020-08-29 MED ORDER — ACETAMINOPHEN 325 MG PO TABS: 650.0000 mg | ORAL_TABLET | Freq: Four times a day (QID) | ORAL | 0 refills | Status: DC | PRN

## 2020-08-29 MED ORDER — COCONUT OIL OIL: 1.0000 "application " | TOPICAL_OIL | 0 refills | Status: DC | PRN

## 2020-08-29 NOTE — Discharge Instructions (Signed)

## 2020-08-29 NOTE — Progress Notes (Addendum)
CSW met with MOB to complete consult for history of substance use during pregnancy. CSW observed MOB resting in bed, infant in bassinet and FOB on recliner. MOB gave CSW verbal consent to complete consult while FOB was present. CSW explained role and reason for consult. MOB was frustrated, and angry during engagement with CSW.  MOB reported, she does not recall mentioning history of THC use and last use being on last week. MOB reported, she wanted to know who was in charge of documenting such an "assumption", because it was not accurate. CSW informed MOB that she would relay the message to medical team. MOB denied any other illicit substance use. MOB declined to complete full assessment.    CSW informed MOB of Drug Screen Policy and MOB was confrontational of protocol. Per RN, medical staff has not been able to complete infant's UDS, due to MOB's removal of cotton balls. CSW made CPS report to [place] with Donna Wilder. Per Donna Wilder there are no barriers to discharge at this time.          CSW will continue to follow the CDS and will inform CPS with any additional information.   Theresa Lawson, MSW, LCSW-A Clinical Social Worker [phone]   

## 2020-09-25 ENCOUNTER — Ambulatory Visit (INDEPENDENT_AMBULATORY_CARE_PROVIDER_SITE_OTHER): Payer: Medicaid Other | Admitting: Obstetrics and Gynecology

## 2020-09-25 ENCOUNTER — Other Ambulatory Visit: Payer: Self-pay

## 2020-09-25 ENCOUNTER — Encounter: Payer: Self-pay | Admitting: Obstetrics and Gynecology

## 2020-09-25 ENCOUNTER — Other Ambulatory Visit (HOSPITAL_COMMUNITY)
Admission: RE | Admit: 2020-09-25 | Discharge: 2020-09-25 | Disposition: A | Discharge status: Home / Self Care | Payer: Medicaid Other | Source: Ambulatory Visit | Attending: Obstetrics and Gynecology | Admitting: Obstetrics and Gynecology

## 2020-09-25 DIAGNOSIS — Z3009 Encounter for other general counseling and advice on contraception: Secondary | ICD-10-CM | POA: Diagnosis not present

## 2020-09-25 MED ORDER — IBUPROFEN 600 MG PO TABS: 600.0000 mg | ORAL_TABLET | Freq: Four times a day (QID) | ORAL | 0 refills | Status: DC

## 2020-09-25 MED ORDER — NORELGESTROMIN-ETH ESTRADIOL 150-35 MCG/24HR TD PTWK: 1.0000 | MEDICATED_PATCH | TRANSDERMAL | 12 refills | Status: DC

## 2020-09-25 NOTE — Progress Notes (Signed)
Post Partum Visit Note  Theresa Lawson is a 30 y.o. [phone] female who presents for a postpartum visit. She is 4 weeks postpartum following a normal spontaneous vaginal delivery.  I have fully reviewed the prenatal and intrapartum course. The delivery was at 40+ gestational weeks.  Anesthesia: IV sedation. Postpartum course has been doing well. Baby is doing well. Baby is feeding by bottle - Theresa Lawson . Bleeding no bleeding. Bowel function is normal. Bladder function is normal. Patient is sexually active. Contraception method is Ship broker weekly. Pt is interested in Patch method.  Postpartum depression screening: negative, score 0.   Odor discharge today. Would like to be tested.   The pregnancy intention screening data noted above was reviewed. Potential methods of contraception were discussed. The patient elected to proceed with Contraceptive Patch.    Edinburgh Postnatal Depression Scale - 09/25/20 1123       Edinburgh Postnatal Depression Scale:  In the Past 7 Days   I have been able to laugh and see the funny side of things. 0    I have looked forward with enjoyment to things. 0    I have blamed myself unnecessarily when things went wrong. 0    I have been anxious or worried for no good reason. 0    I have felt scared or panicky for no good reason. 0    Things have been getting on top of me. 0    I have been so unhappy that I have had difficulty sleeping. 0    I have felt sad or miserable. 0    I have been so unhappy that I have been crying. 0    The thought of harming myself has occurred to me. 0    Edinburgh Postnatal Depression Scale Total 0             Health Maintenance Due  Topic Date Due   COVID-19 Vaccine (1) Never done    The following portions of the patient's history were reviewed and updated as appropriate: allergies, current medications, past family history, past medical history, past social history, past surgical history, and problem  list.  Review of Systems Pertinent items are noted in HPI.  Objective:  BP 123/88   Pulse 83   Wt 174 lb (78.9 kg)   Breastfeeding No   BMI 29.87 kg/m    General:  alert and cooperative  Lungs: clear to auscultation bilaterally  Heart:  regular rate and rhythm, S1, S2 normal, no murmur, click, rub or gallop  Abdomen: soft, non-tender; bowel sounds normal; no masses,  no organomegaly   Wound NA  GU exam:  normal wet prep collected, no uterine tenderness or CMT       Assessment:  1. Postpartum exam  - Cervicovaginal ancillary only( [place])  2. Counseling for initiation of birth control method  Normal postpartum exam.  Rx: Ortho [facility]   Plan:   Essential components of care per ACOG recommendations:  1.  Mood and well being: Patient with negative depression screening today. Reviewed local resources for support.  - Patient tobacco use? No.   - hx of drug use? No.    2. Infant care and feeding:  -Patient currently breastmilk feeding? No.  -Social determinants of health (SDOH) reviewed in EPIC. No concerns. The following needs were identified: none  3. Sexuality, contraception and birth spacing - Patient does not want a pregnancy in the next year.  Desired family size is 6 children.  -  Reviewed forms of contraception in tiered fashion. Patient desired [name] today.   - Discussed birth spacing of 18 months  4. Sleep and fatigue -Encouraged family/partner/community support of 4 hrs of uninterrupted sleep to help with mood and fatigue  5. Physical Recovery  - Discussed patients delivery and complications. She describes her labor as good. - Patient had a Vaginal, no problems at delivery. Patient had a  two minor bilateral labial abrasions.  laceration. Perineal healing reviewed. Patient expressed understanding - Patient has urinary incontinence? No. - Patient is safe to resume physical and sexual activity  6.  Health Maintenance - HM due items addressed  - Last  pap smear  Diagnosis  Date Value Ref Range Status  03/17/2020   Final   - Negative for intraepithelial lesion or malignancy (NILM)   Pap smear not done at today's visit.  -Breast Cancer screening indicated? No.   7. Chronic Disease/Pregnancy Condition follow up: None  - PCP follow up  Venia Carbon, NP [facility] [employer]

## 2020-09-28 ENCOUNTER — Other Ambulatory Visit: Payer: Self-pay | Admitting: Obstetrics and Gynecology

## 2020-09-28 LAB — CERVICOVAGINAL ANCILLARY ONLY
Bacterial Vaginitis (gardnerella): POSITIVE — AB
Candida Glabrata: NEGATIVE
Candida Vaginitis: NEGATIVE
Chlamydia: NEGATIVE
Comment: NEGATIVE
Comment: NEGATIVE
Comment: NEGATIVE
Comment: NEGATIVE
Comment: NEGATIVE
Comment: NORMAL
Neisseria Gonorrhea: NEGATIVE
Trichomonas: NEGATIVE

## 2020-09-28 MED ORDER — METRONIDAZOLE 500 MG PO TABS: 500.0000 mg | ORAL_TABLET | Freq: Two times a day (BID) | ORAL | 0 refills | Status: AC

## 2020-12-25 ENCOUNTER — Encounter (HOSPITAL_COMMUNITY): Payer: Self-pay | Admitting: Emergency Medicine

## 2020-12-25 ENCOUNTER — Ambulatory Visit (HOSPITAL_COMMUNITY)
Admission: EM | Admit: 2020-12-25 | Discharge: 2020-12-25 | Disposition: A | Discharge status: Home / Self Care | Payer: Medicaid Other | Attending: Family Medicine | Admitting: Family Medicine

## 2020-12-25 ENCOUNTER — Other Ambulatory Visit: Payer: Self-pay

## 2020-12-25 DIAGNOSIS — R059 Cough, unspecified: Secondary | ICD-10-CM | POA: Diagnosis present

## 2020-12-25 DIAGNOSIS — Z20822 Contact with and (suspected) exposure to covid-19: Secondary | ICD-10-CM | POA: Insufficient documentation

## 2020-12-25 DIAGNOSIS — Z87891 Personal history of nicotine dependence: Secondary | ICD-10-CM | POA: Diagnosis not present

## 2020-12-25 DIAGNOSIS — J069 Acute upper respiratory infection, unspecified: Secondary | ICD-10-CM | POA: Insufficient documentation

## 2020-12-25 MED ORDER — PROMETHAZINE-DM 6.25-15 MG/5ML PO SYRP: 5.0000 mL | ORAL_SOLUTION | Freq: Four times a day (QID) | ORAL | 0 refills | Status: DC | PRN

## 2020-12-25 NOTE — ED Triage Notes (Signed)
Pt c/o congestion, cough, body aches, sore throat for 2 days.

## 2020-12-26 LAB — SARS CORONAVIRUS 2 (TAT 6-24 HRS): SARS Coronavirus 2: NEGATIVE

## 2020-12-27 NOTE — ED Provider Notes (Signed)
[facility]    CSN: [id number] Arrival date & time: 12/25/20  1855      History   Chief Complaint Chief Complaint  Patient presents with   Nasal Congestion   Cough    HPI Theresa Lawson is a 30 y.o. female.   Patient presenting today with 2-day history of congestion, cough, body aches, sore throat, fatigue.  She denies chest pain, shortness of breath, abdominal pain, nausea vomiting or diarrhea.  Multiple of her children are sick with similar symptoms.  No known sick contacts otherwise.  No known chronic medical problems including asthma, allergies.  So far taking over-the-counter cold and congestion medications with minimal relief.   Past Medical History:  Diagnosis Date   Asthma    childhood   Eczema    Headache(784.0)    SINCE CURRENT PREGNANCY   Infection    YEAST INF;NOT FREQ   Irregular periods/menstrual cycles 12/18/2011   Polyhydramnios 2010   WAS GETTING REG NST'S DURING PREG   Pregnancy induced hypertension 2010   NO MEDS;HAD INCREASED STESS   Pyelonephritis    DURING AND BEFORE PREG;WAS HOSPITALIZED PREVIOUSLY   Seasonal allergies    Unsure LMP 12/18/2011   EDC by [redacted[facility]w[redacted]d [facility] at [facility]    Patient Active Problem List   Diagnosis Date Noted   Postpartum exam 09/25/2020   Indication for care or intervention in labor or delivery 08/28/2020   Supervision of normal pregnancy, antepartum 02/10/2020   Counseling for initiation of birth control method 01/22/2019   History of gestational hypertension 12/26/2018   Close exposure to COVID-19 virus 12/26/2018   Vaginal delivery 01/19/2012    Past Surgical History:  Procedure Laterality Date   NO PAST SURGERIES     VAGINAL DELIVERY      OB History     Gravida  6   Para  5   Term  5   Preterm  0   AB  1   Living  5      SAB  0   IAB  1   Ectopic  0   Multiple  0   Live Births  5            Home Medications    Prior to Admission medications   Medication Sig Start Date End  Date Taking? Authorizing Provider  promethazine-dextromethorphan (PROMETHAZINE-DM) 6.25-15 MG/5ML syrup Take 5 mLs by mouth 4 (four) times daily as needed for cough. 12/25/20  Yes Particia Nearing, PA-C  acetaminophen (TYLENOL) 325 MG tablet Take 2 tablets (650 mg total) by mouth every 6 (six) hours as needed for mild pain, moderate pain, fever or headache (for pain scale < 4). 08/29/20   Sheila Oats, MD  albuterol (VENTOLIN HFA) 108 (90 Base) MCG/ACT inhaler Inhale 2 puffs into the lungs every 4 (four) hours as needed for shortness of breath. 03/06/19   Brock Bad, MD  coconut oil OIL Apply 1 application topically as needed (nipple pain). 08/29/20   Sheila Oats, MD  ibuprofen (ADVIL) 600 MG tablet Take 1 tablet (600 mg total) by mouth every 6 (six) hours. 09/25/20   Rasch, Victorino Dike I, NP  norelgestromin-ethinyl estradiol (ORTHO EVRA) 150-35 MCG/24HR transdermal patch Place 1 patch onto the skin once a week. 09/25/20   Rasch, Victorino Dike I, NP  Prenatal Vit-Fe Fumarate-FA (PRENATAL VITAMINS) 28-0.8 MG TABS Take 1 tablet by mouth daily. 05/14/20   Rasch, Victorino Dike I, NP  cetirizine (ZYRTEC) 10 MG tablet Take 1 tablet (  10 mg total) by mouth daily. Patient not taking: Reported on 08/16/2018 08/13/18 10/14/18  Leftwich-Kirby, Wilmer Floor, CNM    Family History Family History  Problem Relation Age of Onset   Asthma Sister    Asthma Son    Asthma Brother        X 2   Nephrolithiasis Mother        HAS HAD 2-3 SURGERIES TO REMOVE   Kidney disease Mother     Social History Social History   Tobacco Use   Smoking status: Former    Types: Cigarettes    Quit date: 05/20/2011    Years since quitting: 9.6   Smokeless tobacco: Never  Vaping Use   Vaping Use: Never used  Substance Use Topics   Alcohol use: Not Currently   Drug use: Not Currently    Types: Marijuana    Comment: last used a week ago     Allergies   Patient has no known allergies.   Review of Systems Review of  Systems Per HPI  Physical Exam Triage Vital Signs ED Triage Vitals  Enc Vitals Group     BP 12/25/20 1953 (!) 136/97     Pulse Rate 12/25/20 1953 (!) 109     Resp 12/25/20 1953 20     Temp 12/25/20 1953 98.6 F (37 C)     Temp Source 12/25/20 1953 Oral     SpO2 12/25/20 1953 98 %     Weight --      Height --      Head Circumference --      Peak Flow --      Pain Score 12/25/20 2030 8     Pain Loc --      Pain Edu? --      Excl. in GC? --    No data found.  Updated Vital Signs BP (!) 136/97   Pulse (!) 109   Temp 98.6 F (37 C) (Oral)   Resp 20   LMP 12/11/2020   SpO2 98%   Visual Acuity Right Eye Distance:   Left Eye Distance:   Bilateral Distance:    Right Eye Near:   Left Eye Near:    Bilateral Near:     Physical Exam Vitals and nursing note reviewed.  Constitutional:      Appearance: Normal appearance. She is not ill-appearing.  HENT:     Head: Atraumatic.     Right Ear: Tympanic membrane normal.     Left Ear: Tympanic membrane normal.     Nose: Rhinorrhea present.     Mouth/Throat:     Mouth: Mucous membranes are moist.     Pharynx: Posterior oropharyngeal erythema present.  Eyes:     Extraocular Movements: Extraocular movements intact.     Conjunctiva/sclera: Conjunctivae normal.  Cardiovascular:     Rate and Rhythm: Normal rate and regular rhythm.     Heart sounds: Normal heart sounds.  Pulmonary:     Effort: Pulmonary effort is normal. No respiratory distress.     Breath sounds: Normal breath sounds. No wheezing.  Abdominal:     General: Bowel sounds are normal. There is no distension.     Palpations: Abdomen is soft.     Tenderness: There is no abdominal tenderness. There is no guarding.  Musculoskeletal:        General: Normal range of motion.     Cervical back: Normal range of motion and neck supple.  Skin:    General: Skin is  warm and dry.  Neurological:     Mental Status: She is alert and oriented to person, place, and time.   Psychiatric:        Mood and Affect: Mood normal.        Thought Content: Thought content normal.        Judgment: Judgment normal.     UC Treatments / Results  Labs (all labs ordered are listed, but only abnormal results are displayed) Labs Reviewed  SARS CORONAVIRUS 2 (TAT 6-24 HRS)    EKG   Radiology No results found.  Procedures Procedures (including critical care time)  Medications Ordered in UC Medications - No data to display  Initial Impression / Assessment and Plan / UC Course  I have reviewed the triage vital signs and the nursing notes.  Pertinent labs & imaging results that were available during my care of the patient were reviewed by me and considered in my medical decision making (see chart for details).     Mildly tachycardic and hypertensive in triage but otherwise vital signs reassuring.  She is in no acute distress today.  Suspect viral illness causing her symptoms.  COVID PCR pending, discussed over-the-counter medications and home care and will give Phenergan DM for symptomatic benefit.  Work note given with quarantine protocol.  Return for acutely worsening symptoms.  Final Clinical Impressions(s) / UC Diagnoses   Final diagnoses:  Viral URI with cough   Discharge Instructions   None    ED Prescriptions     Medication Sig Dispense Auth. Provider   promethazine-dextromethorphan (PROMETHAZINE-DM) 6.25-15 MG/5ML syrup Take 5 mLs by mouth 4 (four) times daily as needed for cough. 100 mL Particia Nearing, [place]      PDMP not reviewed this encounter.   Particia Nearing, [place] 12/27/20 1656

## 2021-07-16 ENCOUNTER — Ambulatory Visit (HOSPITAL_COMMUNITY)
Admission: EM | Admit: 2021-07-16 | Discharge: 2021-07-16 | Disposition: A | Discharge status: Home / Self Care | Payer: Medicaid Other | Attending: Nurse Practitioner | Admitting: Nurse Practitioner

## 2021-07-16 ENCOUNTER — Encounter (HOSPITAL_COMMUNITY): Payer: Self-pay | Admitting: Emergency Medicine

## 2021-07-16 DIAGNOSIS — N898 Other specified noninflammatory disorders of vagina: Secondary | ICD-10-CM | POA: Diagnosis not present

## 2021-07-16 LAB — POCT URINALYSIS DIPSTICK, ED / UC
Bilirubin Urine: NEGATIVE
Glucose, UA: NEGATIVE mg/dL
Ketones, ur: NEGATIVE mg/dL
Leukocytes,Ua: NEGATIVE
Nitrite: NEGATIVE
Protein, ur: NEGATIVE mg/dL
Specific Gravity, Urine: 1.02 (ref 1.005–1.030)
Urobilinogen, UA: 0.2 mg/dL (ref 0.0–1.0)
pH: 7 (ref 5.0–8.0)

## 2021-07-16 LAB — POC URINE PREG, ED: Preg Test, Ur: NEGATIVE

## 2021-07-16 MED ORDER — METRONIDAZOLE 0.75 % VA GEL: 1.0000 | Freq: Every day | VAGINAL | 0 refills | Status: AC

## 2021-07-16 NOTE — Discharge Instructions (Addendum)
Your urine pregnancy and urinalysis are negative today. ?Use medication as prescribed. ?You will be contacted if your cytology results are positive and provided treatment.  If your results are negative for BV, you will be contacted and asked to stop the medication. ?Increase condom use. ?Follow-up as needed. ?

## 2021-07-16 NOTE — ED Provider Notes (Signed)
[facility]    CSN: [id number] Arrival date & time: 07/16/21  7829      History   Chief Complaint Chief Complaint  Patient presents with   Vaginal Discharge    HPI MARRIANA Lawson is a 31 y.o. female.   The patient is a 31 year old female who presents for vaginal discharge and odor.  Symptoms have been present for the past several days to up to 1 week.  She states that the odor "just does not smell right".  She states the discharge started off as then but has lessened since the onset.  She states that she has been abstinent over the past 9 months, and then had sex with a new partner.  She states that that episode was unprotected.  She reports that her last menstrual cycle was around 06/13/2021.  She denies vaginal itching, urinary frequency, urgency, hesitancy, abdominal pain, or pelvic pain.  She states that she does have a history of chlamydia.  The history is provided by the patient.   Past Medical History:  Diagnosis Date   Asthma    childhood   Eczema    Headache(784.0)    SINCE CURRENT PREGNANCY   Infection    YEAST INF;NOT FREQ   Irregular periods/menstrual cycles 12/18/2011   Polyhydramnios 2010   WAS GETTING REG NST'S DURING PREG   Pregnancy induced hypertension 2010   NO MEDS;HAD INCREASED STESS   Pyelonephritis    DURING AND BEFORE PREG;WAS HOSPITALIZED PREVIOUSLY   Seasonal allergies    Unsure LMP 12/18/2011   EDC by [redacted[facility]w[redacted]d [facility] at [facility]    Patient Active Problem List   Diagnosis Date Noted   Postpartum exam 09/25/2020   Indication for care or intervention in labor or delivery 08/28/2020   Supervision of normal pregnancy, antepartum 02/10/2020   Counseling for initiation of birth control method 01/22/2019   History of gestational hypertension 12/26/2018   Close exposure to COVID-19 virus 12/26/2018   Vaginal delivery 01/19/2012    Past Surgical History:  Procedure Laterality Date   NO PAST SURGERIES     VAGINAL DELIVERY      OB History      Gravida  6   Para  5   Term  5   Preterm  0   AB  1   Living  5      SAB  0   IAB  1   Ectopic  0   Multiple  0   Live Births  5            Home Medications    Prior to Admission medications   Medication Sig Start Date End Date Taking? Authorizing Provider  metroNIDAZOLE (METROGEL) 0.75 % vaginal gel Place 1 Applicatorful vaginally at bedtime for 5 days. 07/16/21 07/21/21 Yes Leath-Warren, Sadie Haber, NP  acetaminophen (TYLENOL) 325 MG tablet Take 2 tablets (650 mg total) by mouth every 6 (six) hours as needed for mild pain, moderate pain, fever or headache (for pain scale < 4). 08/29/20   Sheila Oats, MD  albuterol (VENTOLIN HFA) 108 (90 Base) MCG/ACT inhaler Inhale 2 puffs into the lungs every 4 (four) hours as needed for shortness of breath. 03/06/19   Brock Bad, MD  coconut oil OIL Apply 1 application topically as needed (nipple pain). 08/29/20   Sheila Oats, MD  ibuprofen (ADVIL) 600 MG tablet Take 1 tablet (600 mg total) by mouth every 6 (six) hours. 09/25/20   Rasch, Harolyn Rutherford, NP  norelgestromin-ethinyl estradiol (ORTHO EVRA) 150-35 MCG/24HR transdermal patch Place 1 patch onto the skin once a week. 09/25/20   Rasch, Victorino Dike I, NP  Prenatal Vit-Fe Fumarate-FA (PRENATAL VITAMINS) 28-0.8 MG TABS Take 1 tablet by mouth daily. 05/14/20   Rasch, Victorino Dike I, NP  promethazine-dextromethorphan (PROMETHAZINE-DM) 6.25-15 MG/5ML syrup Take 5 mLs by mouth 4 (four) times daily as needed for cough. 12/25/20   Particia Nearing, PA-C  cetirizine (ZYRTEC) 10 MG tablet Take 1 tablet (10 mg total) by mouth daily. Patient not taking: Reported on 08/16/2018 08/13/18 10/14/18  Leftwich-Kirby, Wilmer Floor, CNM    Family History Family History  Problem Relation Age of Onset   Asthma Sister    Asthma Son    Asthma Brother        X 2   Nephrolithiasis Mother        HAS HAD 2-3 SURGERIES TO REMOVE   Kidney disease Mother     Social History Social History   Tobacco Use    Smoking status: Former    Types: Cigarettes    Quit date: 05/20/2011    Years since quitting: 10.1   Smokeless tobacco: Never  Vaping Use   Vaping Use: Never used  Substance Use Topics   Alcohol use: Not Currently   Drug use: Not Currently    Types: Marijuana    Comment: last used a week ago     Allergies   Patient has no known allergies.   Review of Systems Review of Systems  Constitutional: Negative.   Gastrointestinal: Negative.   Genitourinary:  Positive for vaginal discharge. Negative for flank pain, frequency, hematuria, menstrual problem, pelvic pain, urgency, vaginal bleeding and vaginal pain.  Skin: Negative.   Psychiatric/Behavioral: Negative.      Physical Exam Triage Vital Signs ED Triage Vitals [07/16/21 0823]  Enc Vitals Group     BP (!) 141/94     Pulse Rate 87     Resp 16     Temp      Temp src      SpO2 98 %     Weight 173 lb 15.1 oz (78.9 kg)     Height 5\' 4"  (1.626 m)     Head Circumference      Peak Flow      Pain Score 0     Pain Loc      Pain Edu?      Excl. in GC?    No data found.  Updated Vital Signs BP (!) 141/94 (BP Location: Left Arm)   Pulse 87   Resp 16   Ht 5\' 4"  (1.626 m)   Wt 173 lb 15.1 oz (78.9 kg)   SpO2 98%   BMI 29.86 kg/m   Visual Acuity Right Eye Distance:   Left Eye Distance:   Bilateral Distance:    Right Eye Near:   Left Eye Near:    Bilateral Near:     Physical Exam Vitals reviewed.  Constitutional:      Appearance: Normal appearance.  Eyes:     Extraocular Movements: Extraocular movements intact.     Pupils: Pupils are equal, round, and reactive to light.  Cardiovascular:     Rate and Rhythm: Normal rate and regular rhythm.     Pulses: Normal pulses.     Heart sounds: Normal heart sounds.  Pulmonary:     Effort: Pulmonary effort is normal.     Breath sounds: Normal breath sounds.  Abdominal:     General: Bowel sounds  are normal.     Palpations: Abdomen is soft.  Musculoskeletal:      Cervical back: Normal range of motion and neck supple.  Skin:    General: Skin is warm and dry.  Neurological:     General: No focal deficit present.     Mental Status: She is alert and oriented to person, place, and time.  Psychiatric:        Mood and Affect: Mood normal.        Behavior: Behavior normal.     UC Treatments / Results  Labs (all labs ordered are listed, but only abnormal results are displayed) Labs Reviewed  POCT URINALYSIS DIPSTICK, ED / UC - Abnormal; Notable for the following components:      Result Value   Hgb urine dipstick TRACE (*)    All other components within normal limits  POC URINE PREG, ED  CERVICOVAGINAL ANCILLARY ONLY    EKG   Radiology No results found.  Procedures Procedures (including critical care time)  Medications Ordered in UC Medications - No data to display  Initial Impression / Assessment and Plan / UC Course  I have reviewed the triage vital signs and the nursing notes.  Pertinent labs & imaging results that were available during my care of the patient were reviewed by me and considered in my medical decision making (see chart for details).  The patient is a 31 year old female who presents for vaginal odor and discharge.  Symptoms have been present over the past week.  She denies abdominal pain, or urinary symptoms.  Based on her description of her symptoms, they are consistent with BV.  In the interim, cytology swab was collected along with urine pregnancy and urinalysis.  Her urinalysis did show trace of blood, but no other abnormalities.  Her urine pregnancy is negative.  We will start the patient on metronidazole gel.  Advised the patient that she will be contacted if her cytology results are positive.  If her results are negative for BV, she will also be contacted to stop the medication.  Patient encouraged to continue increasing condom use.  Follow-up as needed. Final Clinical Impressions(s) / UC Diagnoses   Final diagnoses:   Vaginal discharge  Vaginal odor     Discharge Instructions      Your urine pregnancy and urinalysis are negative today. Use medication as prescribed. You will be contacted if your cytology results are positive and provided treatment.  If your results are negative for BV, you will be contacted and asked to stop the medication. Increase condom use. Follow-up as needed.     ED Prescriptions     Medication Sig Dispense Auth. Provider   metroNIDAZOLE (METROGEL) 0.75 % vaginal gel Place 1 Applicatorful vaginally at bedtime for 5 days. 75 g Leath-Warren, Sadie Haber, NP      PDMP not reviewed this encounter.   Abran Cantor, NP 07/16/21 [phone]

## 2021-07-16 NOTE — ED Triage Notes (Signed)
Pt reports vaginal discharge and odor x 1 week. Hx of BV. ?

## 2021-07-19 LAB — CERVICOVAGINAL ANCILLARY ONLY
Bacterial Vaginitis (gardnerella): POSITIVE — AB
Candida Glabrata: NEGATIVE
Candida Vaginitis: NEGATIVE
Chlamydia: NEGATIVE
Comment: NEGATIVE
Comment: NEGATIVE
Comment: NEGATIVE
Comment: NEGATIVE
Comment: NEGATIVE
Comment: NORMAL
Neisseria Gonorrhea: NEGATIVE
Trichomonas: NEGATIVE

## 2021-09-03 ENCOUNTER — Encounter (HOSPITAL_COMMUNITY): Payer: Self-pay | Admitting: Physician Assistant

## 2021-09-03 ENCOUNTER — Ambulatory Visit (HOSPITAL_COMMUNITY)
Admission: EM | Admit: 2021-09-03 | Discharge: 2021-09-03 | Disposition: A | Discharge status: Home / Self Care | Payer: Medicaid Other | Attending: Physician Assistant | Admitting: Physician Assistant

## 2021-09-03 DIAGNOSIS — N898 Other specified noninflammatory disorders of vagina: Secondary | ICD-10-CM | POA: Diagnosis not present

## 2021-09-03 MED ORDER — METRONIDAZOLE 500 MG PO TABS: 500.0000 mg | ORAL_TABLET | Freq: Two times a day (BID) | ORAL | 0 refills | Status: DC

## 2021-09-03 NOTE — ED Provider Notes (Signed)
[place]    CSN: [id number] Arrival date & time: 09/03/21  0802      History   Chief Complaint Chief Complaint  Patient presents with   Vaginal Discharge    HPI Theresa Lawson is a 31 y.o. female.   Patient presents today with a 2-day history of vaginal discharge.  She reports this is copious and malodorous.  She has a history of bacterial vaginosis and current symptoms are similar to previous episodes of this condition.  She was seen and treated 07/16/2021 with MetroGel.  She reports that symptoms improved but never completely resolved and believes that she may need oral metronidazole to resolve symptoms.  She is sexually active with female partners but consistently uses condoms.  She has no specific concern for STI and declined additional testing for HIV/hepatitis/syphilis.  She is confident that she is not pregnant.  Denies urinary symptoms including frequency, urgency, dysuria.  Denies any pelvic pain, abdominal pain, fever, nausea, vomiting.   Past Medical History:  Diagnosis Date   Asthma    childhood   Eczema    Headache(784.0)    SINCE CURRENT PREGNANCY   Infection    YEAST INF;NOT FREQ   Irregular periods/menstrual cycles 12/18/2011   Polyhydramnios 2010   WAS GETTING REG NST'S DURING PREG   Pregnancy induced hypertension 2010   NO MEDS;HAD INCREASED STESS   Pyelonephritis    DURING AND BEFORE PREG;WAS HOSPITALIZED PREVIOUSLY   Seasonal allergies    Unsure LMP 12/18/2011   EDC by [redacted]w[redacted]d u/s at [facility]    Patient Active Problem List   Diagnosis Date Noted   Postpartum exam 09/25/2020   Indication for care or intervention in labor or delivery 08/28/2020   Supervision of normal pregnancy, antepartum 02/10/2020   Counseling for initiation of birth control method 01/22/2019   History of gestational hypertension 12/26/2018   Close exposure to COVID-19 virus 12/26/2018   Vaginal delivery 01/19/2012    Past Surgical History:  Procedure Laterality Date   NO  PAST SURGERIES     VAGINAL DELIVERY      OB History     Gravida  6   Para  5   Term  5   Preterm  0   AB  1   Living  5      SAB  0   IAB  1   Ectopic  0   Multiple  0   Live Births  5            Home Medications    Prior to Admission medications   Medication Sig Start Date End Date Taking? Authorizing Provider  metroNIDAZOLE (FLAGYL) 500 MG tablet Take 1 tablet (500 mg total) by mouth 2 (two) times daily. 09/03/21  Yes Rayen Dafoe, Derry Skill, PA-C  acetaminophen (TYLENOL) 325 MG tablet Take 2 tablets (650 mg total) by mouth every 6 (six) hours as needed for mild pain, moderate pain, fever or headache (for pain scale < 4). 08/29/20   Randa Ngo, MD  albuterol (VENTOLIN HFA) 108 (90 Base) MCG/ACT inhaler Inhale 2 puffs into the lungs every 4 (four) hours as needed for shortness of breath. 03/06/19   Shelly Bombard, MD  coconut oil OIL Apply 1 application topically as needed (nipple pain). 08/29/20   Randa Ngo, MD  ibuprofen (ADVIL) 600 MG tablet Take 1 tablet (600 mg total) by mouth every 6 (six) hours. 09/25/20   Rasch, Artist Pais, NP  norelgestromin-ethinyl estradiol (ORTHO EVRA)  150-35 MCG/24HR transdermal patch Place 1 patch onto the skin once a week. 09/25/20   Rasch, Anderson Malta I, NP  Prenatal Vit-Fe Fumarate-FA (PRENATAL VITAMINS) 28-0.8 MG TABS Take 1 tablet by mouth daily. 05/14/20   Rasch, Anderson Malta I, NP  promethazine-dextromethorphan (PROMETHAZINE-DM) 6.25-15 MG/5ML syrup Take 5 mLs by mouth 4 (four) times daily as needed for cough. 12/25/20   Volney American, PA-C  cetirizine (ZYRTEC) 10 MG tablet Take 1 tablet (10 mg total) by mouth daily. Patient not taking: Reported on 08/16/2018 08/13/18 10/14/18  Leftwich-Kirby, Kathie Dike, CNM    Family History Family History  Problem Relation Age of Onset   Asthma Sister    Asthma Son    Asthma Brother        X 2   Nephrolithiasis Mother        HAS HAD 2-3 SURGERIES TO REMOVE   Kidney disease Mother      Social History Social History   Tobacco Use   Smoking status: Former    Types: Cigarettes    Quit date: 05/20/2011    Years since quitting: 10.2   Smokeless tobacco: Never  Vaping Use   Vaping Use: Never used  Substance Use Topics   Alcohol use: Not Currently   Drug use: Not Currently    Types: Marijuana    Comment: last used a week ago     Allergies   Patient has no known allergies.   Review of Systems Review of Systems  Constitutional:  Negative for activity change, appetite change, fatigue and fever.  Gastrointestinal:  Negative for abdominal pain, diarrhea, nausea and vomiting.  Genitourinary:  Positive for vaginal discharge. Negative for dysuria, frequency, pelvic pain, urgency, vaginal bleeding and vaginal pain.    Physical Exam Triage Vital Signs ED Triage Vitals [09/03/21 0821]  Enc Vitals Group     BP 128/84     Pulse Rate 82     Resp 16     Temp 98 F (36.7 C)     Temp src      SpO2 99 %     Weight      Height      Head Circumference      Peak Flow      Pain Score      Pain Loc      Pain Edu?      Excl. in [place]?    No data found.  Updated Vital Signs BP 128/84   Pulse 82   Temp 98 F (36.7 C)   Resp 16   SpO2 99%   Visual Acuity Right Eye Distance:   Left Eye Distance:   Bilateral Distance:    Right Eye Near:   Left Eye Near:    Bilateral Near:     Physical Exam Vitals reviewed.  Constitutional:      General: She is awake. She is not in acute distress.    Appearance: Normal appearance. She is well-developed. She is not ill-appearing.     Comments: Very pleasant female appears stated age in no acute distress sitting comfortably in exam room  HENT:     Head: Normocephalic and atraumatic.  Cardiovascular:     Rate and Rhythm: Normal rate and regular rhythm.     Heart sounds: Normal heart sounds, S1 normal and S2 normal. No murmur heard. Pulmonary:     Effort: Pulmonary effort is normal.     Breath sounds: Normal breath  sounds. No wheezing, rhonchi or rales.  Comments: Clear to auscultation bilaterally Abdominal:     General: Bowel sounds are normal.     Palpations: Abdomen is soft.     Tenderness: There is no abdominal tenderness. There is no right CVA tenderness, left CVA tenderness, guarding or rebound.  Genitourinary:    Comments: Exam deferred Psychiatric:        Behavior: Behavior is cooperative.     UC Treatments / Results  Labs (all labs ordered are listed, but only abnormal results are displayed) Labs Reviewed  CERVICOVAGINAL ANCILLARY ONLY    EKG   Radiology No results found.  Procedures Procedures (including critical care time)  Medications Ordered in UC Medications - No data to display  Initial Impression / Assessment and Plan / UC Course  I have reviewed the triage vital signs and the nursing notes.  Pertinent labs & imaging results that were available during my care of the patient were reviewed by me and considered in my medical decision making (see chart for details).     STI swab collected today-results pending.  Will empirically treat for bacterial vaginosis given clinical presentation.  Patient was started on metronidazole 500 mg twice daily for 7 days with instruction to abstain from alcohol consumption while on this medication and for 72 hours after completing course due to associated Antabuse side effects.  Recommended hypoallergenic soaps and detergents.  She is to wear loosefitting cotton underwear.  Discussed that if she develops any additional symptoms including abdominal pain, pelvic pain, fever, nausea, vomiting she should be seen immediately.  Strict return precautions given to which she expressed understanding.  Final Clinical Impressions(s) / UC Diagnoses   Final diagnoses:  Vaginal discharge     Discharge Instructions      We are treating you for bacterial vaginosis.  We will contact you if we need to arrange any additional treatment based on your  swab results.  Please take metronidazole 500 mg twice daily for 7 days.  Make sure not to drink any alcohol while on this medication and for 3 days after completing course as it will cause you to vomit.  Wear loosefitting cotton underwear and use hypoallergenic soaps and detergents.  Use condoms with each sexual encounter.  If you develop any abdominal pain, pelvic pain, fever, nausea, vomiting you should be seen immediately.     ED Prescriptions     Medication Sig Dispense Auth. Provider   metroNIDAZOLE (FLAGYL) 500 MG tablet Take 1 tablet (500 mg total) by mouth 2 (two) times daily. 14 tablet Ras Kollman, Derry Skill, PA-C      PDMP not reviewed this encounter.   Terrilee Croak, PA-C 09/03/21 0848

## 2021-09-03 NOTE — Discharge Instructions (Signed)
We are treating you for bacterial vaginosis.  We will contact you if we need to arrange any additional treatment based on your swab results.  Please take metronidazole 500 mg twice daily for 7 days.  Make sure not to drink any alcohol while on this medication and for 3 days after completing course as it will cause you to vomit.  Wear loosefitting cotton underwear and use hypoallergenic soaps and detergents.  Use condoms with each sexual encounter.  If you develop any abdominal pain, pelvic pain, fever, nausea, vomiting you should be seen immediately.

## 2021-09-03 NOTE — ED Triage Notes (Signed)
Reports she feels like she has BV. Pt was given gel last time but wants a pill.

## 2021-09-06 LAB — CERVICOVAGINAL ANCILLARY ONLY
Bacterial Vaginitis (gardnerella): POSITIVE — AB
Candida Glabrata: NEGATIVE
Candida Vaginitis: NEGATIVE
Chlamydia: NEGATIVE
Comment: NEGATIVE
Comment: NEGATIVE
Comment: NEGATIVE
Comment: NEGATIVE
Comment: NEGATIVE
Comment: NORMAL
Neisseria Gonorrhea: NEGATIVE
Trichomonas: NEGATIVE

## 2021-09-28 ENCOUNTER — Ambulatory Visit (HOSPITAL_COMMUNITY)
Admission: EM | Admit: 2021-09-28 | Discharge: 2021-09-28 | Disposition: A | Discharge status: Home / Self Care | Payer: Medicaid Other | Attending: Family Medicine | Admitting: Family Medicine

## 2021-09-28 ENCOUNTER — Encounter (HOSPITAL_COMMUNITY): Payer: Self-pay

## 2021-09-28 DIAGNOSIS — J020 Streptococcal pharyngitis: Secondary | ICD-10-CM | POA: Diagnosis not present

## 2021-09-28 LAB — POCT RAPID STREP A, ED / UC: Streptococcus, Group A Screen (Direct): POSITIVE — AB

## 2021-09-28 MED ORDER — IBUPROFEN 100 MG/5ML PO SUSP
ORAL | Status: AC
  Filled 2021-09-28: qty 40

## 2021-09-28 MED ORDER — AMOXICILLIN 250 MG/5ML PO SUSR: 500.0000 mg | Freq: Two times a day (BID) | ORAL | 0 refills | Status: DC

## 2021-09-28 MED ORDER — IBUPROFEN 100 MG/5ML PO SUSP: 800.0000 mg | Freq: Once | ORAL | Status: DC

## 2021-09-28 NOTE — ED Triage Notes (Signed)
2 days of sore throat, swollen gland and cough.Pt is taking tylenol

## 2021-09-30 ENCOUNTER — Telehealth (HOSPITAL_COMMUNITY): Payer: Self-pay

## 2021-09-30 MED ORDER — AMOXICILLIN 250 MG/5ML PO SUSR: 500.0000 mg | Freq: Two times a day (BID) | ORAL | 0 refills | Status: AC

## 2021-09-30 NOTE — Telephone Encounter (Signed)
Pt is requesting a refill on Amoxicillin, she spilled some of the previous bottle. Per,Michelle will authorize 1 refill.

## 2021-12-21 LAB — OB RESULTS CONSOLE HIV ANTIBODY (ROUTINE TESTING): HIV: NONREACTIVE

## 2021-12-21 LAB — OB RESULTS CONSOLE RUBELLA ANTIBODY, IGM: Rubella: IMMUNE

## 2021-12-21 LAB — HEPATITIS C ANTIBODY: HCV Ab: NEGATIVE

## 2021-12-21 LAB — OB RESULTS CONSOLE RPR: RPR: NONREACTIVE

## 2021-12-21 LAB — OB RESULTS CONSOLE HEPATITIS B SURFACE ANTIGEN: Hepatitis B Surface Ag: NEGATIVE

## 2021-12-24 LAB — OB RESULTS CONSOLE GC/CHLAMYDIA
Chlamydia: NEGATIVE
Neisseria Gonorrhea: NEGATIVE

## 2022-02-28 ENCOUNTER — Other Ambulatory Visit: Payer: Self-pay | Admitting: Obstetrics and Gynecology

## 2022-02-28 DIAGNOSIS — Z363 Encounter for antenatal screening for malformations: Secondary | ICD-10-CM

## 2022-02-28 DIAGNOSIS — Z8759 Personal history of other complications of pregnancy, childbirth and the puerperium: Secondary | ICD-10-CM

## 2022-03-03 ENCOUNTER — Encounter: Payer: Self-pay | Admitting: *Deleted

## 2022-03-04 ENCOUNTER — Ambulatory Visit: Payer: Medicaid Other | Attending: Obstetrics and Gynecology

## 2022-03-04 ENCOUNTER — Ambulatory Visit: Payer: Medicaid Other | Admitting: *Deleted

## 2022-03-04 ENCOUNTER — Encounter: Payer: Self-pay | Admitting: *Deleted

## 2022-03-04 VITALS — BP 111/64 | HR 89

## 2022-03-04 DIAGNOSIS — Z8759 Personal history of other complications of pregnancy, childbirth and the puerperium: Secondary | ICD-10-CM

## 2022-03-04 DIAGNOSIS — Z363 Encounter for antenatal screening for malformations: Secondary | ICD-10-CM | POA: Diagnosis not present

## 2022-03-04 DIAGNOSIS — Z3689 Encounter for other specified antenatal screening: Secondary | ICD-10-CM | POA: Insufficient documentation

## 2022-04-04 NOTE — L&D Delivery Note (Signed)
Operative Delivery Note At 7:07 AM a viable female was delivered via Vaginal, Vacuum Neurosurgeon).  Presentation: vertex; Position: Occiput,, Anterior; Station: +2. Verbal consent: obtained from patient rapidly. Unable to discuss r/b/a due to emergent nature of procedure due to fetal bradycardia.  The patient was completely dilated at +2 station and vacuum was applied and pressure applied to the "green zone" 450-600 mm Hg. Infant head delivered after 1 pull with maternal effort of pushing. Upon delivery of head the vacuum pressure released and vacuum removed. There were 0 pop offs and a total application time of [id number] seconds. A liveborn female neonate was delivered from the OA position at 0707.  There was no nuchal cord.  Spontaneous cry was noted.  No shoulder dystocia was encountered.  Infant was placed on the maternal abdomen immediately following delivery.  Oropharynx and nasopharynx were bulb suctioned immediately following delivery.  Cord was doubly clamped and cut.  Cord blood and cord gases obtained. Infant was transferred to a warmer with an Therapist, sports. Pitocin was added to the patient's IV.  The placenta delivered spontaneously and apparently intact with a 3 vessel cord at 0715 and discarded.  Uterus was noted to be firm.  No perineal, cervical, vaginal, labial or periuretheral lacerations were identified.  All sponge, needle and instrument counts were correct at the end of the procedure.  The patient and baby remained in the LDR in stable condition.   Baby Name: Edison Pace  APGAR: 6, 9; weight pending .   Placenta status: L&D Cord pH: pending  Anesthesia:  None Episiotomy: None Lacerations:  None Suture Repair:  n/a Est. Blood Loss (mL):  255 cc  Mom to postpartum.  Baby to Couplet care / Skin to Skin.  Josefine Class 06/15/2022, 7:51 AM

## 2022-05-25 LAB — OB RESULTS CONSOLE GBS: GBS: NEGATIVE

## 2022-06-15 ENCOUNTER — Inpatient Hospital Stay (HOSPITAL_COMMUNITY)
Admission: AD | Admit: 2022-06-15 | Discharge: 2022-06-16 | DRG: 807 | Disposition: A | Discharge status: Home / Self Care | Payer: Medicaid Other | Attending: Obstetrics and Gynecology | Admitting: Obstetrics and Gynecology

## 2022-06-15 ENCOUNTER — Other Ambulatory Visit: Payer: Self-pay

## 2022-06-15 ENCOUNTER — Encounter (HOSPITAL_COMMUNITY): Payer: Self-pay | Admitting: Obstetrics and Gynecology

## 2022-06-15 DIAGNOSIS — R030 Elevated blood-pressure reading, without diagnosis of hypertension: Secondary | ICD-10-CM | POA: Diagnosis present

## 2022-06-15 DIAGNOSIS — Z3A39 39 weeks gestation of pregnancy: Secondary | ICD-10-CM | POA: Diagnosis not present

## 2022-06-15 DIAGNOSIS — Z87891 Personal history of nicotine dependence: Secondary | ICD-10-CM

## 2022-06-15 DIAGNOSIS — O26893 Other specified pregnancy related conditions, third trimester: Secondary | ICD-10-CM | POA: Diagnosis present

## 2022-06-15 LAB — CBC
HCT: 31.6 % — ABNORMAL LOW (ref 36.0–46.0)
Hemoglobin: 10.9 g/dL — ABNORMAL LOW (ref 12.0–15.0)
MCH: 29.6 pg (ref 26.0–34.0)
MCHC: 34.5 g/dL (ref 30.0–36.0)
MCV: 85.9 fL (ref 80.0–100.0)
Platelets: 200 10*3/uL (ref 150–400)
RBC: 3.68 MIL/uL — ABNORMAL LOW (ref 3.87–5.11)
RDW: 13.2 % (ref 11.5–15.5)
WBC: 15 10*3/uL — ABNORMAL HIGH (ref 4.0–10.5)
nRBC: 0 % (ref 0.0–0.2)

## 2022-06-15 LAB — COMPREHENSIVE METABOLIC PANEL
ALT: 11 U/L (ref 0–44)
AST: 20 U/L (ref 15–41)
Albumin: 2.8 g/dL — ABNORMAL LOW (ref 3.5–5.0)
Alkaline Phosphatase: 185 U/L — ABNORMAL HIGH (ref 38–126)
Anion gap: 10 (ref 5–15)
BUN: <5 mg/dL — ABNORMAL LOW (ref 6–20)
CO2: 20 mmol/L — ABNORMAL LOW (ref 22–32)
Calcium: 8.8 mg/dL — ABNORMAL LOW (ref 8.9–10.3)
Chloride: 105 mmol/L (ref 98–111)
Creatinine, Ser: 0.67 mg/dL (ref 0.44–1.00)
GFR, Estimated: >60 mL/min (ref >60)
Glucose, Bld: 103 mg/dL — ABNORMAL HIGH (ref 70–99)
Potassium: 3.6 mmol/L (ref 3.5–5.1)
Sodium: 135 mmol/L (ref 135–145)
Total Bilirubin: 0.3 mg/dL (ref 0.3–1.2)
Total Protein: 6.1 g/dL — ABNORMAL LOW (ref 6.5–8.1)

## 2022-06-15 LAB — TYPE AND SCREEN
ABO/RH(D): O POS
Antibody Screen: NEGATIVE

## 2022-06-15 LAB — HIV ANTIBODY (ROUTINE TESTING W REFLEX): HIV Screen 4th Generation wRfx: NONREACTIVE

## 2022-06-15 LAB — RPR: RPR Ser Ql: NONREACTIVE

## 2022-06-15 MED ORDER — PRENATAL MULTIVITAMIN CH
1.0000 | ORAL_TABLET | Freq: Every day | ORAL | Status: DC
  Administered 2022-06-15 – 2022-06-16 (×2): 1 via ORAL
  Filled 2022-06-15 (×2): qty 1

## 2022-06-15 MED ORDER — ONDANSETRON HCL 4 MG PO TABS: 4.0000 mg | ORAL_TABLET | ORAL | Status: DC | PRN

## 2022-06-15 MED ORDER — IBUPROFEN 600 MG PO TABS
600.0000 mg | ORAL_TABLET | Freq: Four times a day (QID) | ORAL | Status: DC
  Administered 2022-06-15 – 2022-06-16 (×5): 600 mg via ORAL
  Filled 2022-06-15 (×5): qty 1

## 2022-06-15 MED ORDER — ONDANSETRON HCL 4 MG/2ML IJ SOLN
4.0000 mg | Freq: Four times a day (QID) | INTRAMUSCULAR | Status: DC | PRN
  Administered 2022-06-15: 4 mg via INTRAVENOUS
  Filled 2022-06-15: qty 2

## 2022-06-15 MED ORDER — ACETAMINOPHEN 325 MG PO TABS: 650.0000 mg | ORAL_TABLET | ORAL | Status: DC | PRN

## 2022-06-15 MED ORDER — FLEET ENEMA 7-19 GM/118ML RE ENEM: 1.0000 | ENEMA | RECTAL | Status: DC | PRN

## 2022-06-15 MED ORDER — SENNOSIDES-DOCUSATE SODIUM 8.6-50 MG PO TABS: 2.0000 | ORAL_TABLET | Freq: Every day | ORAL | Status: DC

## 2022-06-15 MED ORDER — MISOPROSTOL 200 MCG PO TABS
ORAL_TABLET | ORAL | Status: AC
  Administered 2022-06-15: 1000 ug via RECTAL
  Filled 2022-06-15: qty 5

## 2022-06-15 MED ORDER — ZOLPIDEM TARTRATE 5 MG PO TABS: 5.0000 mg | ORAL_TABLET | Freq: Every evening | ORAL | Status: DC | PRN

## 2022-06-15 MED ORDER — DIPHENHYDRAMINE HCL 25 MG PO CAPS: 25.0000 mg | ORAL_CAPSULE | Freq: Four times a day (QID) | ORAL | Status: DC | PRN

## 2022-06-15 MED ORDER — FENTANYL CITRATE (PF) 100 MCG/2ML IJ SOLN
50.0000 ug | INTRAMUSCULAR | Status: DC | PRN
  Administered 2022-06-15 (×2): 100 ug via INTRAVENOUS
  Filled 2022-06-15 (×2): qty 2

## 2022-06-15 MED ORDER — ACETAMINOPHEN 325 MG PO TABS
650.0000 mg | ORAL_TABLET | ORAL | Status: DC | PRN
  Administered 2022-06-15 – 2022-06-16 (×4): 650 mg via ORAL
  Filled 2022-06-15 (×4): qty 2

## 2022-06-15 MED ORDER — LACTATED RINGERS IV SOLN: 500.0000 mL | INTRAVENOUS | Status: DC | PRN

## 2022-06-15 MED ORDER — COCONUT OIL OIL: 1.0000 | TOPICAL_OIL | Status: DC | PRN

## 2022-06-15 MED ORDER — SOD CITRATE-CITRIC ACID 500-334 MG/5ML PO SOLN: 30.0000 mL | ORAL | Status: DC | PRN

## 2022-06-15 MED ORDER — LIDOCAINE HCL (PF) 1 % IJ SOLN: 30.0000 mL | INTRAMUSCULAR | Status: DC | PRN

## 2022-06-15 MED ORDER — SIMETHICONE 80 MG PO CHEW: 80.0000 mg | CHEWABLE_TABLET | ORAL | Status: DC | PRN

## 2022-06-15 MED ORDER — OXYTOCIN BOLUS FROM INFUSION
333.0000 mL | Freq: Once | INTRAVENOUS | Status: AC
  Administered 2022-06-15: 333 mL via INTRAVENOUS

## 2022-06-15 MED ORDER — TETANUS-DIPHTH-ACELL PERTUSSIS 5-2.5-18.5 LF-MCG/0.5 IM SUSY: 0.5000 mL | PREFILLED_SYRINGE | Freq: Once | INTRAMUSCULAR | Status: DC

## 2022-06-15 MED ORDER — OXYCODONE-ACETAMINOPHEN 5-325 MG PO TABS: 1.0000 | ORAL_TABLET | ORAL | Status: DC | PRN

## 2022-06-15 MED ORDER — OXYCODONE HCL 5 MG PO TABS
5.0000 mg | ORAL_TABLET | ORAL | Status: DC | PRN
  Administered 2022-06-15 (×2): 5 mg via ORAL
  Filled 2022-06-15 (×2): qty 1

## 2022-06-15 MED ORDER — OXYCODONE HCL 5 MG PO TABS
10.0000 mg | ORAL_TABLET | ORAL | Status: DC | PRN
  Administered 2022-06-15: 10 mg via ORAL
  Filled 2022-06-15: qty 2

## 2022-06-15 MED ORDER — BENZOCAINE-MENTHOL 20-0.5 % EX AERO: 1.0000 | INHALATION_SPRAY | CUTANEOUS | Status: DC | PRN

## 2022-06-15 MED ORDER — OXYCODONE-ACETAMINOPHEN 5-325 MG PO TABS: 2.0000 | ORAL_TABLET | ORAL | Status: DC | PRN

## 2022-06-15 MED ORDER — LACTATED RINGERS IV SOLN: INTRAVENOUS | Status: DC

## 2022-06-15 MED ORDER — DIBUCAINE (PERIANAL) 1 % EX OINT: 1.0000 | TOPICAL_OINTMENT | CUTANEOUS | Status: DC | PRN

## 2022-06-15 MED ORDER — ONDANSETRON HCL 4 MG/2ML IJ SOLN: 4.0000 mg | INTRAMUSCULAR | Status: DC | PRN

## 2022-06-15 MED ORDER — OXYTOCIN-SODIUM CHLORIDE 30-0.9 UT/500ML-% IV SOLN
2.5000 [IU]/h | INTRAVENOUS | Status: DC
  Filled 2022-06-15: qty 500

## 2022-06-15 MED ORDER — MISOPROSTOL 200 MCG PO TABS: 1000.0000 ug | ORAL_TABLET | Freq: Once | ORAL | Status: AC

## 2022-06-15 MED ORDER — WITCH HAZEL-GLYCERIN EX PADS: 1.0000 | MEDICATED_PAD | CUTANEOUS | Status: DC | PRN

## 2022-06-15 MED ORDER — HYDROXYZINE HCL 50 MG PO TABS: 50.0000 mg | ORAL_TABLET | Freq: Four times a day (QID) | ORAL | Status: DC | PRN

## 2022-06-15 NOTE — H&P (Signed)
Theresa Lawson is a 32 y.o. female [phone] at 33w5dpresenting for active labor.  Pregnancy Problem List: [place] Late entry to prenatal care - 15 weeks Vitamin D deficiency H/o GHTN H/o pyelonephritis Asthma - childhood does not require inhaler  OB History     Gravida  7   Para  5   Term  5   Preterm  0   AB  1   Living  5      SAB  0   IAB  1   Ectopic  0   Multiple  0   Live Births  5          Past Medical History:  Diagnosis Date   Asthma    childhood   Eczema    Headache(784.0)    SINCE CURRENT PREGNANCY   Infection    YEAST INF;NOT FREQ   Irregular periods/menstrual cycles 12/18/2011   Polyhydramnios 2010   WAS GETTING REG NST'S DURING PREG   Pregnancy induced hypertension 2010   NO MEDS;HAD INCREASED STESS   Pyelonephritis    DURING AND BEFORE PREG;WAS HOSPITALIZED PREVIOUSLY   Seasonal allergies    Unsure LMP 12/18/2011   [facility] 929w5d[facility] Past Surgical History:  Procedure Laterality Date   NO PAST SURGERIES     VAGINAL DELIVERY     Family History: family history includes Asthma in her brother, sister, and son; Kidney disease in her mother; Nephrolithiasis in her mother. Social History:  reports that she quit smoking about 11 years ago. Her smoking use included cigarettes. She has never used smokeless tobacco. She reports that she does not currently use alcohol. She reports that she does not currently use drugs after having used the following drugs: Marijuana.     Maternal Diabetes: No Genetic Screening: Normal: Panorama low risk female, Horizon negative Maternal Ultrasounds/Referrals: Normal Fetal Ultrasounds or other Referrals:  None Maternal Substance Abuse:  No Significant Maternal Medications:  None Significant Maternal Lab Results:  Group B Strep negative Number of Prenatal Visits:greater than 3 verified prenatal visits Other Comments:  None  Review of Systems  All other systems reviewed and are  negative.  Maternal Medical History:  Reason for admission: Contractions.   Contractions: Onset was 1-2 hours ago.   Frequency: regular.   Perceived severity is strong.   Fetal activity: Perceived fetal activity is normal.   Prenatal Complications - Diabetes: none.   Dilation: 5 Effacement (%): 80 Station: -2 Exam by:: JoBlima SingerRN Blood pressure (!) 145/86, pulse 100, temperature 98.3 F (36.8 C), temperature source Oral, resp. rate 20, height '5\' 4"'$  (1.626 m), weight 84.4 kg, last menstrual period 09/10/2021, SpO2 100 %, not currently breastfeeding. Maternal Exam:  Uterine Assessment: Contraction strength is firm.  Contraction frequency is regular.  Abdomen: Patient reports no abdominal tenderness. Fetal presentation: vertex Introitus: Normal vulva. Pelvis: adequate for delivery.   Cervix: Cervix evaluated by digital exam.   5/80/-2 per MAU evaluation  Fetal Exam Fetal Monitor Review: Baseline rate: 145.  Variability: moderate (6-25 bpm).   Pattern: accelerations present and no decelerations.     Physical Exam Vitals reviewed.  Constitutional:      Appearance: Normal appearance.     Comments: Uncomfortable with contractions  Cardiovascular:     Rate and Rhythm: Normal rate.  Pulmonary:     Effort: Pulmonary effort is normal. No respiratory distress.  Genitourinary:    General: Normal vulva.  Neurological:     General:  No focal deficit present.     Mental Status: She is alert and oriented to person, place, and time.  Psychiatric:        Mood and Affect: Mood normal.        Behavior: Behavior normal.     Prenatal labs: Reviewed from in office chart ABO, Rh:  O pos Antibody:   neg Rubella:   Immune RPR:   Non-reactive HBsAg:   Negative HIV:   Non-reactive GBS:   Negative   Assessment/Plan:  32 y.o. female [id number] at [redacted]w[redacted]d Active labor [place] Mult[place] Late entry to prenatal care - 15 weeks Vitamin D deficiency H/o GHTN H/o pyelonephritis Asthma -  childhood does not require inhaler  Elevated BP admission  Admit to L&D General admission labs CMP&PCR Continue to monitor BP CEFM/TOCO CLD May have epidural upon request Expectant management  EJosefine Class3/13/2024, 2:58 AM

## 2022-06-15 NOTE — MAU Note (Signed)
.  Theresa Lawson is a 32 y.o. at [redacted]w[redacted]d here in MAU reporting: here via EMS c/o ctx that started around 0000 - every couple of minutes. Denies VB - states she felt a trickle when EMS came to pick her up but unsure if it was urine or water breaking. +FM. Reports she had membrane sweep in office today and was 2cm dilated   Onset of complaint: 0000 Pain score: 8 Vitals:   06/15/22 0239  BP: (!) 145/86  Pulse: 100  Resp: 20  Temp: 98.3 F (36.8 C)  SpO2: 100%     FHT:140 Lab orders placed from triage:

## 2022-06-16 LAB — CBC
HCT: 27.5 % — ABNORMAL LOW (ref 36.0–46.0)
Hemoglobin: 9.1 g/dL — ABNORMAL LOW (ref 12.0–15.0)
MCH: 29 pg (ref 26.0–34.0)
MCHC: 33.1 g/dL (ref 30.0–36.0)
MCV: 87.6 fL (ref 80.0–100.0)
Platelets: 193 10*3/uL (ref 150–400)
RBC: 3.14 MIL/uL — ABNORMAL LOW (ref 3.87–5.11)
RDW: 13.6 % (ref 11.5–15.5)
WBC: 9.8 10*3/uL (ref 4.0–10.5)
nRBC: 0 % (ref 0.0–0.2)

## 2022-06-16 LAB — BIRTH TISSUE RECOVERY COLLECTION (PLACENTA DONATION)

## 2022-06-16 MED ORDER — IBUPROFEN 600 MG PO TABS: 600.0000 mg | ORAL_TABLET | Freq: Four times a day (QID) | ORAL | 1 refills | Status: DC | PRN

## 2022-06-16 MED ORDER — SENNOSIDES-DOCUSATE SODIUM 8.6-50 MG PO TABS: 2.0000 | ORAL_TABLET | Freq: Every day | ORAL | 1 refills | Status: DC | PRN

## 2022-06-16 NOTE — Discharge Summary (Signed)
Postpartum Discharge Summary  Date of Service updated 06-16-22     Patient Name: Theresa Lawson DOB: Jul 14, 1990 MRN: [id number]  Date of admission: 06/15/2022 Delivery date:06/15/2022  Delivering provider: Bing Matter D  Date of discharge: 06/16/2022  Admitting diagnosis: Normal labor [O80, Z37.9] Intrauterine pregnancy: [redacted]w[redacted]d    Secondary diagnosis:  Principal Problem:   Normal labor  Additional problems: elevated BP in labor not requiring med    Discharge diagnosis: Term Pregnancy Delivered                                              Post partum procedures: n/a Augmentation: N/A Complications: None  Hospital course: Onset of Labor With Vaginal Delivery      32y.o. yo [id number] admitted in Active Labor on 06/15/2022. Labor course was complicated by VAVD  Membrane Rupture Time/Date: 5:37 AM ,06/15/2022   Delivery Method:Vaginal, Vacuum (Extractor)  Episiotomy: None  Lacerations:  None  Patient had a postpartum course complicated by n/a.  She is ambulating, tolerating a regular diet, passing flatus, and urinating well. Patient is discharged home in stable condition on 06/16/22.  Newborn Data: Birth date:06/15/2022  Birth time:7:07 AM  Gender:Female  Living status:Living  Apgars:6 ,9  Weight:3147 g   Magnesium Sulfate received: No BMZ received: No Rhophylac:No MMR:No T-DaP: check office EMR Flu: No Transfusion:No  Physical exam  Vitals:   06/15/22 1539 06/15/22 1812 06/15/22 2230 06/16/22 0520  BP: 138/82 121/77 119/73 124/81  Pulse: 68 66 74 66  Resp: '18 18 16 16  '$ Temp: 98.5 F (36.9 C) 98.7 F (37.1 C) 98.5 F (36.9 C) 98.3 F (36.8 C)  TempSrc: Oral  Oral Oral  SpO2: 100% 100% 99% 100%  Weight:      Height:       General: alert and no distress Lochia: appropriate Uterine Fundus: FF, NT Incision: N/A DVT Evaluation: No evidence of DVT seen on physical exam. Labs: Lab Results  Component Value Date   WBC 9.8 06/16/2022   HGB 9.1 (L)  06/16/2022   HCT 27.5 (L) 06/16/2022   MCV 87.6 06/16/2022   PLT 193 06/16/2022      Latest Ref Rng & Units 06/15/2022    4:07 AM  CMP  Glucose 70 - 99 mg/dL 103   BUN 6 - 20 mg/dL <5   Creatinine 0.44 - 1.00 mg/dL 0.67   Sodium 135 - 145 mmol/L 135   Potassium 3.5 - 5.1 mmol/L 3.6   Chloride 98 - 111 mmol/L 105   CO2 22 - 32 mmol/L 20   Calcium 8.9 - 10.3 mg/dL 8.8   Total Protein 6.5 - 8.1 g/dL 6.1   Total Bilirubin 0.3 - 1.2 mg/dL 0.3   Alkaline Phos 38 - 126 U/L 185   AST 15 - 41 U/L 20   ALT 0 - 44 U/L 11    Edinburgh Score:    06/15/2022   10:20 PM  Edinburgh Postnatal Depression Scale Screening Tool  I have been able to laugh and see the funny side of things. 0  I have looked forward with enjoyment to things. 0  I have blamed myself unnecessarily when things went wrong. 1  I have been anxious or worried for no good reason. 0  I have felt scared or panicky for no good reason. 0  Things  have been getting on top of me. 0  I have been so unhappy that I have had difficulty sleeping. 0  I have felt sad or miserable. 0  I have been so unhappy that I have been crying. 0  The thought of harming myself has occurred to me. 0  Edinburgh Postnatal Depression Scale Total 1      After visit meds:  Allergies as of 06/16/2022   No Known Allergies      Medication List     STOP taking these medications    norelgestromin-ethinyl estradiol 150-35 MCG/24HR transdermal patch Commonly known as: XULANE       TAKE these medications    albuterol 108 (90 Base) MCG/ACT inhaler Commonly known as: VENTOLIN HFA Inhale 2 puffs into the lungs every 4 (four) hours as needed for shortness of breath.   hydrocortisone 2.5 % cream Apply 1 Application topically daily.   ibuprofen 600 MG tablet Commonly known as: ADVIL Take 1 tablet (600 mg total) by mouth every 6 (six) hours as needed.   senna-docusate 8.6-50 MG tablet Commonly known as: Senokot-S Take 2 tablets by mouth daily  as needed for mild constipation.   Vitafol Gummies 3.33-0.333-34.8 MG Chew Chew 3 tablets by mouth daily.   VITAMIN D PO Take 2 capsules by mouth daily.         Discharge home in stable condition Infant Feeding:  both Infant Disposition:home with mother Discharge instruction: per After Visit Summary and Postpartum booklet. Activity: Advance as tolerated. Pelvic rest for 6 weeks.  Diet: routine diet Anticipated Birth Control: Unsure Postpartum Appointment:6 weeks Additional Postpartum F/U:  no Future Appointments:No future appointments. Follow up Visit:  [facility]. Schedule an appointment as soon as possible for a visit in 6 week(s).   Specialty: Obstetrics and Gynecology Why: For Postpartum follow-up Contact information: [address] [place] [phone]                    06/16/2022 Delice Lesch, MD

## 2022-06-17 ENCOUNTER — Other Ambulatory Visit: Payer: Self-pay | Admitting: Obstetrics and Gynecology

## 2022-06-17 DIAGNOSIS — Z3490 Encounter for supervision of normal pregnancy, unspecified, unspecified trimester: Secondary | ICD-10-CM

## 2022-06-25 ENCOUNTER — Inpatient Hospital Stay (HOSPITAL_COMMUNITY): Payer: Medicaid Other

## 2022-06-25 ENCOUNTER — Inpatient Hospital Stay (HOSPITAL_COMMUNITY)
Admission: RE | Admit: 2022-06-25 | Payer: Medicaid Other | Source: Home / Self Care | Admitting: Obstetrics and Gynecology

## 2022-06-27 ENCOUNTER — Telehealth (HOSPITAL_COMMUNITY): Payer: Self-pay

## 2022-06-27 NOTE — Telephone Encounter (Addendum)
Patient reports feeling good. Patient states that her right leg is sore at times. RN told patient to contact her provider and if she has any calf tenderness, redness, or skin is warm to touch she should contact her provider. Patient declines any other questions/concerns about her health and healing.  Patient reports that baby is doing well. She states that the jaundice is going away and the level is coming down. Baby sleeps in a crib. RN reviewed ABC's of safe sleep with patient. Patient declines any questions or concerns about baby.  EPDS score is 2.  [facility]   06/27/22,1012

## 2022-07-12 ENCOUNTER — Encounter (HOSPITAL_COMMUNITY): Payer: Self-pay

## 2022-07-12 ENCOUNTER — Ambulatory Visit (HOSPITAL_COMMUNITY)
Admission: EM | Admit: 2022-07-12 | Discharge: 2022-07-12 | Disposition: A | Discharge status: Home / Self Care | Payer: Medicaid Other | Attending: Emergency Medicine | Admitting: Emergency Medicine

## 2022-07-12 DIAGNOSIS — N898 Other specified noninflammatory disorders of vagina: Secondary | ICD-10-CM | POA: Diagnosis not present

## 2022-07-12 DIAGNOSIS — K644 Residual hemorrhoidal skin tags: Secondary | ICD-10-CM

## 2022-07-12 MED ORDER — METRONIDAZOLE 500 MG PO TABS: 500.0000 mg | ORAL_TABLET | Freq: Two times a day (BID) | ORAL | 0 refills | Status: DC

## 2022-07-12 MED ORDER — HYDROCORTISONE ACETATE 25 MG RE SUPP: 25.0000 mg | Freq: Two times a day (BID) | RECTAL | 0 refills | Status: DC

## 2022-07-12 MED ORDER — PREDNISONE 20 MG PO TABS: 40.0000 mg | ORAL_TABLET | Freq: Every day | ORAL | 0 refills | Status: DC

## 2022-07-12 NOTE — ED Provider Notes (Signed)
[facility]    CSN: [id number] Arrival date & time: 07/12/22  1508      History   Chief Complaint Chief Complaint  Patient presents with   vaginal odor    HPI STARNISHA CACCIOLA is a 32 y.o. female.   Patient presents for evaluation of worsening hemorrhoids over the last 3 days.  Associated itching and rectal bleeding.  Has been occurring intermittently during pregnancy, recent childbirth approximately 1 month ago.  Did have a fall landing on sacrum 4 days prior.  Has been painful to sit.  Has attempted use of Anusol cream, started 1 day ago, unsure if effective.  Patient experiencing vaginal odor with green to yellow vaginal discharge without itching or irritation present for 3 days.  Denies sexual intercourse since childbirth.  Has not attempted treatment.  Was sexually active during pregnancy but no known exposure.  Denies urinary symptoms, lower abdominal pain or pressure, flank pain.   Past Medical History:  Diagnosis Date   Asthma    childhood   Eczema    Headache(784.0)    SINCE CURRENT PREGNANCY   Infection    YEAST INF;NOT FREQ   Irregular periods/menstrual cycles 12/18/2011   Polyhydramnios 2010   WAS GETTING REG NST'S DURING PREG   Pregnancy induced hypertension 2010   NO MEDS;HAD INCREASED STESS   Pyelonephritis    DURING AND BEFORE PREG;WAS HOSPITALIZED PREVIOUSLY   Seasonal allergies    Unsure LMP 12/18/2011   EDC by [redacted]w[redacted]d u/s at [facility]    Patient Active Problem List   Diagnosis Date Noted   Normal labor 06/15/2022   Postpartum exam 09/25/2020   Indication for care or intervention in labor or delivery 08/28/2020   Supervision of normal pregnancy, antepartum 02/10/2020   Counseling for initiation of birth control method 01/22/2019   History of gestational hypertension 12/26/2018   Close exposure to COVID-19 virus 12/26/2018   Vaginal delivery 01/19/2012    Past Surgical History:  Procedure Laterality Date   NO PAST SURGERIES     VAGINAL DELIVERY       OB History     Gravida  7   Para  6   Term  6   Preterm  0   AB  1   Living  6      SAB  0   IAB  1   Ectopic  0   Multiple  0   Live Births  6            Home Medications    Prior to Admission medications   Medication Sig Start Date End Date Taking? Authorizing Provider  albuterol (VENTOLIN HFA) 108 (90 Base) MCG/ACT inhaler Inhale 2 puffs into the lungs every 4 (four) hours as needed for shortness of breath. Patient not taking: Reported on 06/15/2022 03/06/19   Brock Bad, MD  hydrocortisone 2.5 % cream Apply 1 Application topically daily. 04/22/22   [provider]  ibuprofen (ADVIL) 600 MG tablet Take 1 tablet (600 mg total) by mouth every 6 (six) hours as needed. 06/16/22   Osborn Coho, MD  Prenatal Vit-Fe Phos-FA-Omega (VITAFOL GUMMIES) 3.33-0.333-34.8 MG CHEW Chew 3 tablets by mouth daily. 05/20/22   [provider]  senna-docusate (SENOKOT-S) 8.6-50 MG tablet Take 2 tablets by mouth daily as needed for mild constipation. 06/16/22   Osborn Coho, MD  VITAMIN D PO Take 2 capsules by mouth daily.    [provider]  cetirizine (ZYRTEC) 10 MG tablet Take 1  tablet (10 mg total) by mouth daily. Patient not taking: Reported on 08/16/2018 08/13/18 10/14/18  Leftwich-Kirby, Wilmer Floor, CNM    Family History Family History  Problem Relation Age of Onset   Asthma Sister    Asthma Son    Asthma Brother        X 2   Nephrolithiasis Mother        HAS HAD 2-3 SURGERIES TO REMOVE   Kidney disease Mother     Social History Social History   Tobacco Use   Smoking status: Former    Types: Cigarettes    Quit date: 05/20/2011    Years since quitting: 11.1   Smokeless tobacco: Never  Vaping Use   Vaping Use: Never used  Substance Use Topics   Alcohol use: Not Currently   Drug use: Not Currently    Types: Marijuana     Allergies   Patient has no known allergies.   Review of Systems Review of Systems   Physical  Exam Triage Vital Signs ED Triage Vitals  Enc Vitals Group     BP 07/12/22 1536 (!) 113/92     Pulse Rate 07/12/22 1536 69     Resp 07/12/22 1536 12     Temp 07/12/22 1536 98.2 F (36.8 C)     Temp Source 07/12/22 1536 Oral     SpO2 07/12/22 1536 98 %     Weight --      Height --      Head Circumference --      Peak Flow --      Pain Score 07/12/22 1534 0     Pain Loc --      Pain Edu? --      Excl. in GC? --    No data found.  Updated Vital Signs BP (!) 113/92 (BP Location: Left Arm)   Pulse 69   Temp 98.2 F (36.8 C) (Oral)   Resp 12   LMP 09/10/2021   SpO2 98%   Breastfeeding No   Visual Acuity Right Eye Distance:   Left Eye Distance:   Bilateral Distance:    Right Eye Near:   Left Eye Near:    Bilateral Near:     Physical Exam Constitutional:      Appearance: Normal appearance.  Eyes:     Extraocular Movements: Extraocular movements intact.  Pulmonary:     Effort: Pulmonary effort is normal.  Genitourinary:    Rectum: External hemorrhoid present. No anal fissure.  Neurological:     Mental Status: She is alert and oriented to person, place, and time. Mental status is at baseline.      UC Treatments / Results  Labs (all labs ordered are listed, but only abnormal results are displayed) Labs Reviewed  CERVICOVAGINAL ANCILLARY ONLY    EKG   Radiology No results found.  Procedures Procedures (including critical care time)  Medications Ordered in UC Medications - No data to display  Initial Impression / Assessment and Plan / UC Course  I have reviewed the triage vital signs and the nursing notes.  Pertinent labs & imaging results that were available during my care of the patient were reviewed by me and considered in my medical decision making (see chart for details).  Vaginal odor, external hemorrhoid  Hemorrhoid visualized on exam, most consistent use of anal cream to determine if effective or if helpful as she is only taken 1 dosage,  prescribed oral prednisone prophylactically in an attempt to reduce discomfort as  her insurance does not pay for it Anusol suppositories, recommended avoidance of constipation and straining through use of increase fluid intake primarily with water, stool softeners and fiber intake, for vaginal odor and discharge prophylactically treating for bacterial vaginosis, discussed administration and avoidance of alcohol, STI labs are pending and will treat further per protocol, advised continued abstinence until lab results, treatment is complete and all symptoms have resolved, may follow-up with urgent care as needed  Final Clinical Impressions(s) / UC Diagnoses   Final diagnoses:  None   Discharge Instructions   None    ED Prescriptions   None    PDMP not reviewed this encounter.   Valinda HoarWhite, Donyae Kilner R, [place]NP 07/12/22 [phone]

## 2022-07-12 NOTE — Discharge Instructions (Signed)
External hemorrhoid is able to be visualized on exam, continue use of Anusol cream consistently for at least 3 days to help you determine if effective  As your insurance does not cover the suppositories will attempt use of oral steroids to see if this will give you additional relief, begin prednisone every morning with food for 5 days  If the cream and oral medicine is not helpful you may try Anusol suppositories, you have been given a written prescription, placed into rectum and then lay on left side for like 5 minutes, can  complete twice daily  Ensure that you are not straining when having bowel movements as this will worsen your symptoms, ensure that you are drinking an adequate amount of water and getting a good amount of fiber into the body  You have any further concerns you may follow-up with his urgent care as needed  Today you are being treated prophylactically for  Bacterial vaginosis   Take Metronidazole 500 mg twice a day for 7 days, do not drink alcohol while using medication, this will make you feel sick   Bacterial vaginosis which results from an overgrowth of one on several organisms that are normally present in your vagina. Vaginosis is an inflammation of the vagina that can result in discharge, itching and pain.  Labs pending 2-3 days, you will be contacted if positive for any sti and treatment will be sent to the pharmacy, you will have to return to the clinic if positive for gonorrhea to receive treatment   Please refrain from having sex until labs results, if positive please refrain from having sex until treatment complete and symptoms resolve   If positive for  Chlamydia  gonorrhea or trichomoniasis please notify partner or partners so they may tested as well  Moving forward, it is recommended you use some form of protection against the transmission of sti infections  such as condoms or dental dams with each sexual encounter     In addition: Avoid baths, hot tubs and  whirlpool spas.  Don't use scented or harsh soaps Avoid irritants. These include scented tampons and pads. Wipe from front to back after using the toilet. Don't douche. Your vagina doesn't require cleansing other than normal bathing.  Use a condom.  Wear cotton underwear, this fabric absorbs some moisture.

## 2022-07-12 NOTE — ED Triage Notes (Signed)
Pt presents with c/o vaginal odor that began Friday. Pt states she had a vaginal delivery three weeks ago. Pt is also experiencing a painful hemorrhoid.

## 2022-07-13 LAB — CERVICOVAGINAL ANCILLARY ONLY
Bacterial Vaginitis (gardnerella): POSITIVE — AB
Candida Glabrata: NEGATIVE
Candida Vaginitis: NEGATIVE
Chlamydia: NEGATIVE
Comment: NEGATIVE
Comment: NEGATIVE
Comment: NEGATIVE
Comment: NEGATIVE
Comment: NEGATIVE
Comment: NORMAL
Neisseria Gonorrhea: NEGATIVE
Trichomonas: POSITIVE — AB

## 2022-11-23 ENCOUNTER — Ambulatory Visit (HOSPITAL_COMMUNITY)
Admission: EM | Admit: 2022-11-23 | Discharge: 2022-11-23 | Disposition: A | Discharge status: Home / Self Care | Payer: Medicaid Other | Attending: Internal Medicine | Admitting: Internal Medicine

## 2022-11-23 ENCOUNTER — Encounter (HOSPITAL_COMMUNITY): Payer: Self-pay

## 2022-11-23 DIAGNOSIS — Z3202 Encounter for pregnancy test, result negative: Secondary | ICD-10-CM

## 2022-11-23 DIAGNOSIS — N760 Acute vaginitis: Secondary | ICD-10-CM | POA: Diagnosis not present

## 2022-11-23 LAB — POCT URINALYSIS DIP (MANUAL ENTRY)
Bilirubin, UA: NEGATIVE
Blood, UA: NEGATIVE
Glucose, UA: NEGATIVE mg/dL
Ketones, POC UA: NEGATIVE mg/dL
Leukocytes, UA: NEGATIVE
Nitrite, UA: NEGATIVE
Protein Ur, POC: NEGATIVE mg/dL
Spec Grav, UA: 1.02 (ref 1.010–1.025)
Urobilinogen, UA: 0.2 E.U./dL — AB
pH, UA: 7.5 (ref 5.0–8.0)

## 2022-11-23 LAB — POCT URINE PREGNANCY: Preg Test, Ur: NEGATIVE

## 2022-11-23 MED ORDER — FLUCONAZOLE 150 MG PO TABS: 150.0000 mg | ORAL_TABLET | ORAL | 0 refills | Status: DC

## 2022-11-23 MED ORDER — METRONIDAZOLE 500 MG PO TABS: 500.0000 mg | ORAL_TABLET | Freq: Two times a day (BID) | ORAL | 0 refills | Status: DC

## 2022-11-23 NOTE — ED Triage Notes (Addendum)
Patient reports that she has had dysuria and urinary frequency x 3 days.  Patient also reports that she has a history of BV and is now having a white and foul smelling discharge, bilateral low back pain, and abdominal cramping.

## 2022-11-23 NOTE — Discharge Instructions (Signed)
Your evaluation suggests that you likely have bacterial vaginitis.  Your vaginal testing is pending and will come back in the next 2-3 days. You will receive a phone call from staff if you test positive for any other infections. We will go ahead and start treatment for BV. Take antibiotic as prescribed to treat this. If you were given flagyl pills, do not drink alcohol during or for 48 hours after finishing antibiotic course.  Follow-up with OB/GYN or return to urgent care as needed.  If you develop any new or worsening symptoms or if your symptoms do not start to improve, please return here or follow-up with your primary care provider. If your symptoms are severe, please go to the emergency room.

## 2022-11-23 NOTE — ED Provider Notes (Signed)
[facility]    CSN: [id number] Arrival date & time: 11/23/22  4098      History   Chief Complaint Chief Complaint  Patient presents with   Dysuria   Vaginal Discharge   Urinary Frequency    HPI Theresa Lawson is a 32 y.o. female.   Patient presents to urgent care for evaluation of thick white vaginal discharge with odor that started a couple of days ago.  Reports associated vaginal itching but states she is more concerned about the discharge and smell.  Reports intermittent dysuria and urinary frequency, no urinary urgency or hesitancy/gross hematuria.  She is sexually active unprotected with monogamous female partner, however would like STD testing to rule out STD.  History of BV, states this feels similar.  No abdominal pain, nausea, vomiting, dizziness, or fever/chills.  No history of diabetes, does not take SGLT2 inhibitor.  Has not attempted treatment of symptoms PTA.  LMP August 20th 2024.   Dysuria Associated symptoms: vaginal discharge   Vaginal Discharge Associated symptoms: dysuria   Urinary Frequency    Past Medical History:  Diagnosis Date   Asthma    childhood   Eczema    Headache(784.0)    SINCE CURRENT PREGNANCY   Infection    YEAST INF;NOT FREQ   Irregular periods/menstrual cycles 12/18/2011   Polyhydramnios 2010   WAS GETTING REG NST'S DURING PREG   Pregnancy induced hypertension 2010   NO MEDS;HAD INCREASED STESS   Pyelonephritis    DURING AND BEFORE PREG;WAS HOSPITALIZED PREVIOUSLY   Seasonal allergies    Unsure LMP 12/18/2011   EDC by [redacted]w[redacted]d u/s at [facility]    Patient Active Problem List   Diagnosis Date Noted   Normal labor 06/15/2022   Postpartum exam 09/25/2020   Indication for care or intervention in labor or delivery 08/28/2020   Supervision of normal pregnancy, antepartum 02/10/2020   Counseling for initiation of birth control method 01/22/2019   History of gestational hypertension 12/26/2018   Close exposure to COVID-19 virus  12/26/2018   Vaginal delivery 01/19/2012    Past Surgical History:  Procedure Laterality Date   NO PAST SURGERIES     VAGINAL DELIVERY      OB History     Gravida  7   Para  6   Term  6   Preterm  0   AB  1   Living  6      SAB  0   IAB  1   Ectopic  0   Multiple  0   Live Births  6            Home Medications    Prior to Admission medications   Medication Sig Start Date End Date Taking? Authorizing Provider  fluconazole (DIFLUCAN) 150 MG tablet Take 1 tablet (150 mg total) by mouth every 3 (three) days. 11/23/22  Yes Carlisle Beers, FNP  metroNIDAZOLE (FLAGYL) 500 MG tablet Take 1 tablet (500 mg total) by mouth 2 (two) times daily. 11/23/22  Yes Carlisle Beers, FNP  albuterol (VENTOLIN HFA) 108 (90 Base) MCG/ACT inhaler Inhale 2 puffs into the lungs every 4 (four) hours as needed for shortness of breath. Patient not taking: Reported on 06/15/2022 03/06/19   Brock Bad, MD  hydrocortisone (ANUSOL-HC) 25 MG suppository Place 1 suppository (25 mg total) rectally 2 (two) times daily. 07/12/22   White, Elita Boone, NP  hydrocortisone 2.5 % cream Apply 1 Application topically daily. 04/22/22   [provider]  ibuprofen (ADVIL) 600 MG tablet Take 1 tablet (600 mg total) by mouth every 6 (six) hours as needed. 06/16/22   Osborn Coho, MD  predniSONE (DELTASONE) 20 MG tablet Take 2 tablets (40 mg total) by mouth daily. 07/12/22   White, Elita Boone, NP  Prenatal Vit-Fe Phos-FA-Omega (VITAFOL GUMMIES) 3.33-0.333-34.8 MG CHEW Chew 3 tablets by mouth daily. 05/20/22   [provider]  senna-docusate (SENOKOT-S) 8.6-50 MG tablet Take 2 tablets by mouth daily as needed for mild constipation. 06/16/22   Osborn Coho, MD  VITAMIN D PO Take 2 capsules by mouth daily.    [provider]  cetirizine (ZYRTEC) 10 MG tablet Take 1 tablet (10 mg total) by mouth daily. Patient not taking: Reported on 08/16/2018 08/13/18 10/14/18   Leftwich-Kirby, Wilmer Floor, CNM    Family History Family History  Problem Relation Age of Onset   Asthma Sister    Asthma Son    Asthma Brother        X 2   Nephrolithiasis Mother        HAS HAD 2-3 SURGERIES TO REMOVE   Kidney disease Mother     Social History Social History   Tobacco Use   Smoking status: Never   Smokeless tobacco: Never  Vaping Use   Vaping status: Never Used  Substance Use Topics   Alcohol use: Not Currently   Drug use: Not Currently    Types: Marijuana     Allergies   Patient has no known allergies.   Review of Systems Review of Systems  Genitourinary:  Positive for dysuria, frequency and vaginal discharge.  Per HPI   Physical Exam Triage Vital Signs ED Triage Vitals  Encounter Vitals Group     BP 11/23/22 0829 (!) 151/108     Systolic BP Percentile --      Diastolic BP Percentile --      Pulse Rate 11/23/22 0829 88     Resp 11/23/22 0829 16     Temp 11/23/22 0829 97.9 F (36.6 C)     Temp Source 11/23/22 0829 Oral     SpO2 11/23/22 0829 98 %     Weight --      Height --      Head Circumference --      Peak Flow --      Pain Score 11/23/22 0830 1     Pain Loc --      Pain Education --      Exclude from Growth Chart --    No data found.  Updated Vital Signs BP (!) 151/108 (BP Location: Right Arm)   Pulse 88   Temp 97.9 F (36.6 C) (Oral)   Resp 16   LMP 11/22/2022   SpO2 98%   Visual Acuity Right Eye Distance:   Left Eye Distance:   Bilateral Distance:    Right Eye Near:   Left Eye Near:    Bilateral Near:     Physical Exam Vitals and nursing note reviewed.  Constitutional:      Appearance: She is not ill-appearing or toxic-appearing.  HENT:     Head: Normocephalic and atraumatic.     Right Ear: Hearing and external ear normal.     Left Ear: Hearing and external ear normal.     Nose: Nose normal.     Mouth/Throat:     Lips: Pink.  Eyes:     General: Lids are normal. Vision grossly intact. Gaze aligned  appropriately.  Extraocular Movements: Extraocular movements intact.     Conjunctiva/sclera: Conjunctivae normal.  Pulmonary:     Effort: Pulmonary effort is normal.  Genitourinary:    Comments: Deferred. Musculoskeletal:     Cervical back: Neck supple.  Skin:    General: Skin is warm and dry.     Capillary Refill: Capillary refill takes less than 2 seconds.     Findings: No rash.  Neurological:     General: No focal deficit present.     Mental Status: She is alert and oriented to person, place, and time. Mental status is at baseline.     Cranial Nerves: No dysarthria or facial asymmetry.  Psychiatric:        Mood and Affect: Mood normal.        Speech: Speech normal.        Behavior: Behavior normal.        Thought Content: Thought content normal.        Judgment: Judgment normal.      UC Treatments / Results  Labs (all labs ordered are listed, but only abnormal results are displayed) Labs Reviewed  POCT URINALYSIS DIP (MANUAL ENTRY) - Abnormal; Notable for the following components:      Result Value   Urobilinogen, UA 0.2 (*)    All other components within normal limits  POCT URINE PREGNANCY  CERVICOVAGINAL ANCILLARY ONLY    EKG   Radiology No results found.  Procedures Procedures (including critical care time)  Medications Ordered in UC Medications - No data to display  Initial Impression / Assessment and Plan / UC Course  I have reviewed the triage vital signs and the nursing notes.  Pertinent labs & imaging results that were available during my care of the patient were reviewed by me and considered in my medical decision making (see chart for details).   1.  Acute vaginitis, negative pregnancy test High clinical suspicion for acute yeast vaginitis and bacterial vaginosis, therefore will treat with diflucan and Flagyl empirically.  Discussed risks of disulfiram reaction to Flagyl. Urine pregnancy test is negative. Vaginal swab pending, will treat for  other positive infections per protocol when labs result. Discussed tips to prevent recurrent yeast vaginitis infections. Safe sex precautions discussed.   Counseled patient on potential for adverse effects with medications prescribed/recommended today, strict ER and return-to-clinic precautions discussed, patient verbalized understanding.    Final Clinical Impressions(s) / UC Diagnoses   Final diagnoses:  Acute vaginitis  Negative pregnancy test     Discharge Instructions      Your evaluation suggests that you likely have bacterial vaginitis.  Your vaginal testing is pending and will come back in the next 2-3 days. You will receive a phone call from staff if you test positive for any other infections. We will go ahead and start treatment for BV. Take antibiotic as prescribed to treat this. If you were given flagyl pills, do not drink alcohol during or for 48 hours after finishing antibiotic course.  Follow-up with OB/GYN or return to urgent care as needed.  If you develop any new or worsening symptoms or if your symptoms do not start to improve, please return here or follow-up with your primary care provider. If your symptoms are severe, please go to the emergency room.      ED Prescriptions     Medication Sig Dispense Auth. Provider   metroNIDAZOLE (FLAGYL) 500 MG tablet Take 1 tablet (500 mg total) by mouth 2 (two) times daily. 14 tablet Reita May  M, FNP   fluconazole (DIFLUCAN) 150 MG tablet Take 1 tablet (150 mg total) by mouth every 3 (three) days. 2 tablet Carlisle Beers, FNP      PDMP not reviewed this encounter.   Reita May [place], [place] 11/23/22 [phone]

## 2022-11-24 LAB — CERVICOVAGINAL ANCILLARY ONLY
Bacterial Vaginitis (gardnerella): POSITIVE — AB
Candida Glabrata: NEGATIVE
Candida Vaginitis: NEGATIVE
Chlamydia: NEGATIVE
Comment: NEGATIVE
Comment: NEGATIVE
Comment: NEGATIVE
Comment: NEGATIVE
Comment: NEGATIVE
Comment: NORMAL
Neisseria Gonorrhea: NEGATIVE
Trichomonas: NEGATIVE

## 2022-12-26 ENCOUNTER — Ambulatory Visit (HOSPITAL_COMMUNITY)
Admission: EM | Admit: 2022-12-26 | Discharge: 2022-12-26 | Disposition: A | Discharge status: Home / Self Care | Payer: Medicaid Other | Attending: Internal Medicine | Admitting: Internal Medicine

## 2022-12-26 ENCOUNTER — Encounter (HOSPITAL_COMMUNITY): Payer: Self-pay

## 2022-12-26 DIAGNOSIS — Z1152 Encounter for screening for COVID-19: Secondary | ICD-10-CM | POA: Diagnosis not present

## 2022-12-26 DIAGNOSIS — R1013 Epigastric pain: Secondary | ICD-10-CM | POA: Insufficient documentation

## 2022-12-26 DIAGNOSIS — R519 Headache, unspecified: Secondary | ICD-10-CM | POA: Diagnosis not present

## 2022-12-26 DIAGNOSIS — R5383 Other fatigue: Secondary | ICD-10-CM | POA: Insufficient documentation

## 2022-12-26 MED ORDER — LIDOCAINE VISCOUS HCL 2 % MT SOLN
OROMUCOSAL | Status: AC
  Filled 2022-12-26: qty 15

## 2022-12-26 MED ORDER — KETOROLAC TROMETHAMINE 30 MG/ML IJ SOLN
INTRAMUSCULAR | Status: AC
  Filled 2022-12-26: qty 1

## 2022-12-26 MED ORDER — ALUM & MAG HYDROXIDE-SIMETH 200-200-20 MG/5ML PO SUSP
30.0000 mL | Freq: Once | ORAL | Status: AC
  Administered 2022-12-26: 30 mL via ORAL

## 2022-12-26 MED ORDER — FAMOTIDINE 20 MG PO TABS: 20.0000 mg | ORAL_TABLET | Freq: Every day | ORAL | 0 refills | Status: DC

## 2022-12-26 MED ORDER — KETOROLAC TROMETHAMINE 30 MG/ML IJ SOLN
30.0000 mg | Freq: Once | INTRAMUSCULAR | Status: AC
  Administered 2022-12-26: 30 mg via INTRAMUSCULAR

## 2022-12-26 MED ORDER — ALUM & MAG HYDROXIDE-SIMETH 200-200-20 MG/5ML PO SUSP
ORAL | Status: AC
  Filled 2022-12-26: qty 30

## 2022-12-26 MED ORDER — LIDOCAINE VISCOUS HCL 2 % MT SOLN
15.0000 mL | Freq: Once | OROMUCOSAL | Status: AC
  Administered 2022-12-26: 15 mL via OROMUCOSAL

## 2022-12-26 NOTE — ED Provider Notes (Signed)
[facility]    CSN: [id number] Arrival date & time: 12/26/22  1142      History   Chief Complaint Chief Complaint  Patient presents with   Abdominal Pain    HPI Theresa Lawson is a 32 y.o. female.   Theresa Lawson is a 32 y.o. female presenting for chief complaint of Abdominal Pain to the upper abdomen for the last 5 days since December 22, 2022. Abdominal pain is localized to the epigastrium, comes and goes in intensity, and can be described as a burning ache. Reports generalized fatigue, reduced appetite, intermittent nausea without vomiting, weakness, and headache for the last 3-4 days.  Her son was sick with COVID a couple of weeks ago, no other known sick contacts with similar symptoms.  No sore throat, ear pain, diarrhea, lower abdominal discomfort, low back pain, body aches, fever/chills, rash, vision changes, chest pain, cough, shortness of breath, wheeze, heart palpitations.  Denies esophageal burning and recent use of NSAIDs/steroids.  No hemoptysis or blood/mucus in the stools.  She took half a pill of her friend's oxycodone and attempt to help with her abdominal pain but states it made her dizzy, nauseous, and did not help with her pain.   Abdominal Pain   Past Medical History:  Diagnosis Date   Asthma    childhood   Eczema    Headache(784.0)    SINCE CURRENT PREGNANCY   Infection    YEAST INF;NOT FREQ   Irregular periods/menstrual cycles 12/18/2011   Polyhydramnios 2010   WAS GETTING REG NST'S DURING PREG   Pregnancy induced hypertension 2010   NO MEDS;HAD INCREASED STESS   Pyelonephritis    DURING AND BEFORE PREG;WAS HOSPITALIZED PREVIOUSLY   Seasonal allergies    Unsure LMP 12/18/2011   EDC by [redacted]w[redacted]d u/s at [facility]    Patient Active Problem List   Diagnosis Date Noted   Normal labor 06/15/2022   Postpartum exam 09/25/2020   Indication for care or intervention in labor or delivery 08/28/2020   Supervision of normal pregnancy, antepartum 02/10/2020    Counseling for initiation of birth control method 01/22/2019   History of gestational hypertension 12/26/2018   Close exposure to COVID-19 virus 12/26/2018   Vaginal delivery 01/19/2012    Past Surgical History:  Procedure Laterality Date   NO PAST SURGERIES     VAGINAL DELIVERY      OB History     Gravida  7   Para  6   Term  6   Preterm  0   AB  1   Living  6      SAB  0   IAB  1   Ectopic  0   Multiple  0   Live Births  6            Home Medications    Prior to Admission medications   Medication Sig Start Date End Date Taking? Authorizing Provider  famotidine (PEPCID) 20 MG tablet Take 1 tablet (20 mg total) by mouth daily. 12/26/22  Yes Carlisle Beers, FNP  albuterol (VENTOLIN HFA) 108 (90 Base) MCG/ACT inhaler Inhale 2 puffs into the lungs every 4 (four) hours as needed for shortness of breath. Patient not taking: Reported on 06/15/2022 03/06/19   Brock Bad, MD  fluconazole (DIFLUCAN) 150 MG tablet Take 1 tablet (150 mg total) by mouth every 3 (three) days. 11/23/22   Carlisle Beers, FNP  hydrocortisone (ANUSOL-HC) 25 MG suppository Place 1 suppository (25  mg total) rectally 2 (two) times daily. 07/12/22   White, Elita Boone, NP  hydrocortisone 2.5 % cream Apply 1 Application topically daily. 04/22/22   [provider]  ibuprofen (ADVIL) 600 MG tablet Take 1 tablet (600 mg total) by mouth every 6 (six) hours as needed. 06/16/22   Osborn Coho, MD  metroNIDAZOLE (FLAGYL) 500 MG tablet Take 1 tablet (500 mg total) by mouth 2 (two) times daily. 11/23/22   Carlisle Beers, FNP  predniSONE (DELTASONE) 20 MG tablet Take 2 tablets (40 mg total) by mouth daily. 07/12/22   White, Elita Boone, NP  Prenatal Vit-Fe Phos-FA-Omega (VITAFOL GUMMIES) 3.33-0.333-34.8 MG CHEW Chew 3 tablets by mouth daily. 05/20/22   [provider]  senna-docusate (SENOKOT-S) 8.6-50 MG tablet Take 2 tablets by mouth daily as needed for mild  constipation. 06/16/22   Osborn Coho, MD  VITAMIN D PO Take 2 capsules by mouth daily.    [provider]  cetirizine (ZYRTEC) 10 MG tablet Take 1 tablet (10 mg total) by mouth daily. Patient not taking: Reported on 08/16/2018 08/13/18 10/14/18  Leftwich-Kirby, Wilmer Floor, CNM    Family History Family History  Problem Relation Age of Onset   Asthma Sister    Asthma Son    Asthma Brother        X 2   Nephrolithiasis Mother        HAS HAD 2-3 SURGERIES TO REMOVE   Kidney disease Mother     Social History Social History   Tobacco Use   Smoking status: Never   Smokeless tobacco: Never  Vaping Use   Vaping status: Never Used  Substance Use Topics   Alcohol use: Not Currently   Drug use: Not Currently    Types: Marijuana     Allergies   Patient has no known allergies.   Review of Systems Review of Systems  Gastrointestinal:  Positive for abdominal pain.  Per HPI   Physical Exam Triage Vital Signs ED Triage Vitals  Encounter Vitals Group     BP 12/26/22 1327 (!) 149/104     Systolic BP Percentile --      Diastolic BP Percentile --      Pulse Rate 12/26/22 1327 (!) 58     Resp 12/26/22 1327 17     Temp 12/26/22 1327 97.8 F (36.6 C)     Temp Source 12/26/22 1327 Oral     SpO2 12/26/22 1327 100 %     Weight 12/26/22 1325 155 lb (70.3 kg)     Height --      Head Circumference --      Peak Flow --      Pain Score 12/26/22 1325 7     Pain Loc --      Pain Education --      Exclude from Growth Chart --    No data found.  Updated Vital Signs BP (!) 149/104 (BP Location: Left Arm)   Pulse (!) 58   Temp 97.8 F (36.6 C) (Oral)   Resp 17   Wt 155 lb (70.3 kg)   LMP 12/14/2022   SpO2 100%   BMI 26.61 kg/m   Visual Acuity Right Eye Distance:   Left Eye Distance:   Bilateral Distance:    Right Eye Near:   Left Eye Near:    Bilateral Near:     Physical Exam Vitals and nursing note reviewed.  Constitutional:      Appearance: She is not  ill-appearing or toxic-appearing.  HENT:     Head: Normocephalic and atraumatic.     Right Ear: Hearing and external ear normal.     Left Ear: Hearing and external ear normal.     Nose: Nose normal.     Mouth/Throat:     Lips: Pink.     Mouth: Mucous membranes are moist. No injury.     Tongue: No lesions. Tongue does not deviate from midline.     Palate: No mass and lesions.     Pharynx: Oropharynx is clear. Uvula midline. No pharyngeal swelling, oropharyngeal exudate, posterior oropharyngeal erythema or uvula swelling.     Tonsils: No tonsillar exudate or tonsillar abscesses.  Eyes:     General: Lids are normal. Vision grossly intact. Gaze aligned appropriately.     Extraocular Movements: Extraocular movements intact.     Conjunctiva/sclera: Conjunctivae normal.  Cardiovascular:     Rate and Rhythm: Normal rate and regular rhythm.     Heart sounds: Normal heart sounds, S1 normal and S2 normal.  Pulmonary:     Effort: Pulmonary effort is normal. No respiratory distress.     Breath sounds: Normal breath sounds and air entry. No wheezing, rhonchi or rales.  Chest:     Chest wall: No tenderness.  Abdominal:     General: Abdomen is flat. Bowel sounds are normal.     Palpations: Abdomen is soft.     Tenderness: There is abdominal tenderness in the epigastric area and left upper quadrant. There is no right CVA tenderness, left CVA tenderness or guarding.  Musculoskeletal:     Cervical back: Neck supple.  Skin:    General: Skin is warm and dry.     Capillary Refill: Capillary refill takes less than 2 seconds.     Findings: No rash.  Neurological:     General: No focal deficit present.     Mental Status: She is alert and oriented to person, place, and time. Mental status is at baseline.     Cranial Nerves: No dysarthria or facial asymmetry.  Psychiatric:        Mood and Affect: Mood normal.        Speech: Speech normal.        Behavior: Behavior normal.        Thought Content:  Thought content normal.        Judgment: Judgment normal.      UC Treatments / Results  Labs (all labs ordered are listed, but only abnormal results are displayed) Labs Reviewed  SARS CORONAVIRUS 2 (TAT 6-24 HRS)    EKG   Radiology No results found.  Procedures Procedures (including critical care time)  Medications Ordered in UC Medications  alum & mag hydroxide-simeth (MAALOX/MYLANTA) 200-200-20 MG/5ML suspension 30 mL (has no administration in time range)  lidocaine (XYLOCAINE) 2 % viscous mouth solution 15 mL (has no administration in time range)  ketorolac (TORADOL) 30 MG/ML injection 30 mg (has no administration in time range)    Initial Impression / Assessment and Plan / UC Course  I have reviewed the triage vital signs and the nursing notes.  Pertinent labs & imaging results that were available during my care of the patient were reviewed by me and considered in my medical decision making (see chart for details).   1.  Epigastric abdominal pain, bad headache, other fatigue Epigastric abdominal pain likely secondary to increased acid production and lack of appetite causing some GERD symptoms. GERD dietary and behavioral precautions discussed. GI cocktail given in clinic  with some improvement in abdominal pain. May use tylenol, advised to avoid oral NSAIDs. Ketorolac 30mg  IM given for headache. COVID test is pending, staff will call if positive. CDC guidelines discussed. Otherwise, will treat for symptomatic relief with famotidine every day, tylenol as needed for pain, and increased fluids to stay well hydrated. Work note given.  Counseled patient on potential for adverse effects with medications prescribed/recommended today, strict ER and return-to-clinic precautions discussed, patient verbalized understanding.    Final Clinical Impressions(s) / UC Diagnoses   Final diagnoses:  Abdominal pain, epigastric  Bad headache  Other fatigue     Discharge Instructions       Take prescribed medicines as directed. Medicine will help reduce the amount of acid your stomach makes and therefore improve your reflux symptoms related to acid production.   Avoid spicy or acidic foods like tomatoes, chocolate, coffee, or acidic fruits like oranges as these can trigger symptoms.  I have included acid reflux education in your packet for your review. Please also allow 2 hours after meals before lying flat to help prevent symptoms.   I have tested you for COVID today, staff will call if this test is positive.  You will be able to see your results on MyChart as well.  You may take Tylenol as needed for pain. I gave you a shot of ketorolac in the clinic today which is similar to ibuprofen.  Do not take any ibuprofen for the next 24 hours.  I also recommend avoiding ibuprofen given your acid reflux symptoms.  Drink plenty of fluids to stay well-hydrated.  If you develop any new or worsening symptoms or if your symptoms do not start to improve, please return here or follow-up with your primary care provider. If your symptoms are severe, please go to the emergency room.      ED Prescriptions     Medication Sig Dispense Auth. Provider   famotidine (PEPCID) 20 MG tablet Take 1 tablet (20 mg total) by mouth daily. 30 tablet Carlisle Beers, FNP      PDMP not reviewed this encounter.   Carlisle Beers, [place] 12/26/22 1430

## 2022-12-26 NOTE — Discharge Instructions (Signed)
Take prescribed medicines as directed. Medicine will help reduce the amount of acid your stomach makes and therefore improve your reflux symptoms related to acid production.   Avoid spicy or acidic foods like tomatoes, chocolate, coffee, or acidic fruits like oranges as these can trigger symptoms.  I have included acid reflux education in your packet for your review. Please also allow 2 hours after meals before lying flat to help prevent symptoms.   I have tested you for COVID today, staff will call if this test is positive.  You will be able to see your results on MyChart as well.  You may take Tylenol as needed for pain. I gave you a shot of ketorolac in the clinic today which is similar to ibuprofen.  Do not take any ibuprofen for the next 24 hours.  I also recommend avoiding ibuprofen given your acid reflux symptoms.  Drink plenty of fluids to stay well-hydrated.  If you develop any new or worsening symptoms or if your symptoms do not start to improve, please return here or follow-up with your primary care provider. If your symptoms are severe, please go to the emergency room.

## 2022-12-26 NOTE — ED Triage Notes (Signed)
Abdominal pain x 1 week. Upper abdominal. Dizziness and headaches. Wants to be tested for covid due to exposure.

## 2022-12-27 LAB — SARS CORONAVIRUS 2 (TAT 6-24 HRS): SARS Coronavirus 2: NEGATIVE

## 2023-01-23 ENCOUNTER — Ambulatory Visit (HOSPITAL_COMMUNITY)
Admission: EM | Admit: 2023-01-23 | Discharge: 2023-01-23 | Disposition: A | Discharge status: Home / Self Care | Payer: Medicaid Other | Attending: Family Medicine | Admitting: Family Medicine

## 2023-01-23 ENCOUNTER — Encounter (HOSPITAL_COMMUNITY): Payer: Self-pay | Admitting: Emergency Medicine

## 2023-01-23 DIAGNOSIS — R1031 Right lower quadrant pain: Secondary | ICD-10-CM | POA: Insufficient documentation

## 2023-01-23 DIAGNOSIS — R1032 Left lower quadrant pain: Secondary | ICD-10-CM | POA: Insufficient documentation

## 2023-01-23 DIAGNOSIS — N898 Other specified noninflammatory disorders of vagina: Secondary | ICD-10-CM | POA: Insufficient documentation

## 2023-01-23 LAB — POCT URINALYSIS DIP (MANUAL ENTRY)
Bilirubin, UA: NEGATIVE
Blood, UA: NEGATIVE
Glucose, UA: NEGATIVE mg/dL
Ketones, POC UA: NEGATIVE mg/dL
Leukocytes, UA: NEGATIVE
Nitrite, UA: NEGATIVE
Protein Ur, POC: NEGATIVE mg/dL
Spec Grav, UA: 1.015 (ref 1.010–1.025)
Urobilinogen, UA: 0.2 U/dL
pH, UA: 6 (ref 5.0–8.0)

## 2023-01-23 LAB — POCT URINE PREGNANCY: Preg Test, Ur: NEGATIVE

## 2023-01-23 MED ORDER — METRONIDAZOLE 500 MG PO TABS: 500.0000 mg | ORAL_TABLET | Freq: Two times a day (BID) | ORAL | 0 refills | Status: DC

## 2023-01-23 NOTE — ED Triage Notes (Signed)
Pt has vaginal discharge with slight smell for a week. Tried treating self at home with OTC medications. Having lower back pain and lower abd pains.

## 2023-01-23 NOTE — ED Provider Notes (Signed)
[facility]   [id number] 01/23/23 Arrival Time: 7829  ASSESSMENT & PLAN:  1. Vaginal discharge   2. Abdominal discomfort, bilateral lower quadrant    No empiric tx. Prefers to wait on vaginal cytology results.   Discharge Instructions      We have sent testing for sexually transmitted infections. We will notify you of any positive results once they are received. If required, we will prescribe any medications you might need.  Please refrain from all sexual activity for at least the next seven days.  Your urine test was normal today.    Without s/s of PID.  Labs Reviewed  POCT URINALYSIS DIP (MANUAL ENTRY)  POCT URINE PREGNANCY  CERVICOVAGINAL ANCILLARY ONLY   Will notify of any positive results. Instructed to refrain from sexual activity for at least seven days.  Reviewed expectations re: course of current medical issues. Questions answered. Outlined signs and symptoms indicating need for more acute intervention. Patient verbalized understanding. After Visit Summary given.   SUBJECTIVE:  Theresa Lawson is a 32 y.o. female who presents with complaint of vaginal discharge. Slight odor. Few days. Mild lower back discomfort. Denies specific abd/pelvic pain. Denies fever/n/v. Barbie Banner urinary frequency. No tx PTA.  Patient's last menstrual period was 01/13/2023.   OBJECTIVE:  Vitals:   01/23/23 0952  BP: 123/85  Pulse: 71  Resp: 18  Temp: 98 F (36.7 C)  TempSrc: Oral  SpO2: 98%    General appearance: alert, cooperative, appears stated age and no distress Lungs: unlabored respirations; speaks full sentences without difficulty Back: no CVA tenderness; FROM at waist Abdomen: soft, non-tender GU: deferred Skin: warm and dry Psychological: alert and cooperative; normal mood and affect.  Results for orders placed or performed during the hospital encounter of 01/23/23  POC urinalysis dipstick  Result Value Ref Range   Color, UA yellow yellow    Clarity, UA clear clear   Glucose, UA negative negative mg/dL   Bilirubin, UA negative negative   Ketones, POC UA negative negative mg/dL   Spec Grav, UA 5.621 3.086 - 1.025   Blood, UA negative negative   pH, UA 6.0 5.0 - 8.0   Protein Ur, POC negative negative mg/dL   Urobilinogen, UA 0.2 0.2 or 1.0 E.U./dL   Nitrite, UA Negative Negative   Leukocytes, UA Negative Negative  POCT urine pregnancy  Result Value Ref Range   Preg Test, Ur Negative Negative    Labs Reviewed  POCT URINALYSIS DIP (MANUAL ENTRY)  POCT URINE PREGNANCY  CERVICOVAGINAL ANCILLARY ONLY    No Known Allergies  Past Medical History:  Diagnosis Date   Asthma    childhood   Eczema    Headache(784.0)    SINCE CURRENT PREGNANCY   Infection    YEAST INF;NOT FREQ   Irregular periods/menstrual cycles 12/18/2011   Polyhydramnios 2010   WAS GETTING REG NST'S DURING PREG   Pregnancy induced hypertension 2010   NO MEDS;HAD INCREASED STESS   Pyelonephritis    DURING AND BEFORE PREG;WAS HOSPITALIZED PREVIOUSLY   Seasonal allergies    Unsure LMP 12/18/2011   EDC by [redacted]w[redacted]d u/s at [facility]   Family History  Problem Relation Age of Onset   Asthma Sister    Asthma Son    Asthma Brother        X 2   Nephrolithiasis Mother        HAS HAD 2-3 SURGERIES TO REMOVE   Kidney disease Mother    Social History   Socioeconomic  History   Marital status: Single    Spouse name: Not on file   Number of children: 4   Years of education: 31   Highest education level: Not on file  Occupational History   Occupation: CNA    Employer: [employer]  Tobacco Use   Smoking status: Never   Smokeless tobacco: Never  Vaping Use   Vaping status: Never Used  Substance and Sexual Activity   Alcohol use: Not Currently   Drug use: Not Currently    Types: Marijuana   Sexual activity: Yes    Partners: Male    Birth control/protection: None  Other Topics Concern   Not on file  Social History Narrative   Not on file   Social  Determinants of Health   Financial Resource Strain: Not on file  Food Insecurity: No Food Insecurity (06/15/2022)   Hunger Vital Sign    Worried About Running Out of Food in the Last Year: Never true    Ran Out of Food in the Last Year: Never true  Transportation Needs: No Transportation Needs (06/15/2022)   PRAPARE - Administrator, Civil Service (Medical): No    Lack of Transportation (Non-Medical): No  Physical Activity: Not on file  Stress: Not on file  Social Connections: Not on file  Intimate Partner Violence: Not At Risk (06/15/2022)   Humiliation, Afraid, Rape, and Kick questionnaire    Fear of Current or Ex-Partner: No    Emotionally Abused: No    Physically Abused: No    Sexually Abused: No           Mardella Layman, MD 01/23/23 1130

## 2023-01-23 NOTE — Discharge Instructions (Signed)
We have sent testing for sexually transmitted infections. We will notify you of any positive results once they are received. If required, we will prescribe any medications you might need.  Please refrain from all sexual activity for at least the next seven days.  Your urine test was normal today.

## 2023-01-24 ENCOUNTER — Telehealth: Payer: Self-pay

## 2023-01-24 LAB — CERVICOVAGINAL ANCILLARY ONLY
Bacterial Vaginitis (gardnerella): POSITIVE — AB
Candida Glabrata: NEGATIVE
Candida Vaginitis: NEGATIVE
Chlamydia: NEGATIVE
Comment: NEGATIVE
Comment: NEGATIVE
Comment: NEGATIVE
Comment: NEGATIVE
Comment: NEGATIVE
Comment: NORMAL
Neisseria Gonorrhea: NEGATIVE
Trichomonas: NEGATIVE

## 2023-03-16 ENCOUNTER — Ambulatory Visit: Payer: Medicaid Other

## 2023-03-16 DIAGNOSIS — Z23 Encounter for immunization: Secondary | ICD-10-CM

## 2023-05-28 ENCOUNTER — Encounter (HOSPITAL_COMMUNITY): Payer: Self-pay | Admitting: Emergency Medicine

## 2023-05-28 ENCOUNTER — Observation Stay (HOSPITAL_COMMUNITY)
Admission: EM | Admit: 2023-05-28 | Discharge: 2023-05-30 | Disposition: A | Discharge status: Home / Self Care | Payer: Medicaid Other | Attending: General Surgery | Admitting: General Surgery

## 2023-05-28 ENCOUNTER — Other Ambulatory Visit: Payer: Self-pay

## 2023-05-28 DIAGNOSIS — Z79899 Other long term (current) drug therapy: Secondary | ICD-10-CM | POA: Diagnosis not present

## 2023-05-28 DIAGNOSIS — J45909 Unspecified asthma, uncomplicated: Secondary | ICD-10-CM | POA: Diagnosis not present

## 2023-05-28 DIAGNOSIS — K8000 Calculus of gallbladder with acute cholecystitis without obstruction: Principal | ICD-10-CM | POA: Insufficient documentation

## 2023-05-28 DIAGNOSIS — K810 Acute cholecystitis: Principal | ICD-10-CM

## 2023-05-28 DIAGNOSIS — R1011 Right upper quadrant pain: Secondary | ICD-10-CM | POA: Diagnosis present

## 2023-05-28 DIAGNOSIS — K8020 Calculus of gallbladder without cholecystitis without obstruction: Secondary | ICD-10-CM | POA: Diagnosis present

## 2023-05-28 NOTE — ED Triage Notes (Signed)
 Patient BIB [facility] c/o RUQ abdominal pain x 1 week, getting progressively worse.  Patient also endorses constipation.    150 mcg fentanyl 20 L FA

## 2023-05-29 ENCOUNTER — Encounter (HOSPITAL_COMMUNITY): Payer: Self-pay | Admitting: Certified Registered Nurse Anesthetist

## 2023-05-29 ENCOUNTER — Other Ambulatory Visit: Payer: Self-pay

## 2023-05-29 ENCOUNTER — Emergency Department (HOSPITAL_COMMUNITY): Payer: Medicaid Other

## 2023-05-29 ENCOUNTER — Encounter (HOSPITAL_COMMUNITY)
Admission: EM | Disposition: A | Discharge status: Home / Self Care | Payer: Self-pay | Source: Home / Self Care | Attending: Emergency Medicine

## 2023-05-29 ENCOUNTER — Emergency Department (HOSPITAL_COMMUNITY): Payer: Medicaid Other | Admitting: Certified Registered Nurse Anesthetist

## 2023-05-29 ENCOUNTER — Emergency Department (HOSPITAL_BASED_OUTPATIENT_CLINIC_OR_DEPARTMENT_OTHER): Payer: Medicaid Other | Admitting: Certified Registered Nurse Anesthetist

## 2023-05-29 DIAGNOSIS — K8000 Calculus of gallbladder with acute cholecystitis without obstruction: Secondary | ICD-10-CM

## 2023-05-29 DIAGNOSIS — K8020 Calculus of gallbladder without cholecystitis without obstruction: Secondary | ICD-10-CM | POA: Diagnosis present

## 2023-05-29 HISTORY — PX: CHOLECYSTECTOMY: SHX55

## 2023-05-29 LAB — URINALYSIS, ROUTINE W REFLEX MICROSCOPIC
Bilirubin Urine: NEGATIVE
Glucose, UA: NEGATIVE mg/dL
Hgb urine dipstick: NEGATIVE
Ketones, ur: NEGATIVE mg/dL
Leukocytes,Ua: NEGATIVE
Nitrite: NEGATIVE
Protein, ur: NEGATIVE mg/dL
Specific Gravity, Urine: 1.031 — ABNORMAL HIGH (ref 1.005–1.030)
pH: 7 (ref 5.0–8.0)

## 2023-05-29 LAB — COMPREHENSIVE METABOLIC PANEL
ALT: 14 U/L (ref 0–44)
AST: 16 U/L (ref 15–41)
Albumin: 3.9 g/dL (ref 3.5–5.0)
Alkaline Phosphatase: 70 U/L (ref 38–126)
Anion gap: 9 (ref 5–15)
BUN: 13 mg/dL (ref 6–20)
CO2: 20 mmol/L — ABNORMAL LOW (ref 22–32)
Calcium: 9.1 mg/dL (ref 8.9–10.3)
Chloride: 107 mmol/L (ref 98–111)
Creatinine, Ser: 0.66 mg/dL (ref 0.44–1.00)
GFR, Estimated: >60 mL/min (ref >60)
Glucose, Bld: 125 mg/dL — ABNORMAL HIGH (ref 70–99)
Potassium: 4.1 mmol/L (ref 3.5–5.1)
Sodium: 136 mmol/L (ref 135–145)
Total Bilirubin: 0.5 mg/dL (ref 0.0–1.2)
Total Protein: 6.9 g/dL (ref 6.5–8.1)

## 2023-05-29 LAB — CBC WITH DIFFERENTIAL/PLATELET
Abs Immature Granulocytes: 0.05 10*3/uL (ref 0.00–0.07)
Basophils Absolute: 0 10*3/uL (ref 0.0–0.1)
Basophils Relative: 0 %
Eosinophils Absolute: 0.1 10*3/uL (ref 0.0–0.5)
Eosinophils Relative: 1 %
HCT: 37.9 % (ref 36.0–46.0)
Hemoglobin: 12.5 g/dL (ref 12.0–15.0)
Immature Granulocytes: 0 %
Lymphocytes Relative: 11 %
Lymphs Abs: 1.5 10*3/uL (ref 0.7–4.0)
MCH: 28.5 pg (ref 26.0–34.0)
MCHC: 33 g/dL (ref 30.0–36.0)
MCV: 86.5 fL (ref 80.0–100.0)
Monocytes Absolute: 0.4 10*3/uL (ref 0.1–1.0)
Monocytes Relative: 3 %
Neutro Abs: 11.7 10*3/uL — ABNORMAL HIGH (ref 1.7–7.7)
Neutrophils Relative %: 85 %
Platelets: 212 10*3/uL (ref 150–400)
RBC: 4.38 MIL/uL (ref 3.87–5.11)
RDW: 12.3 % (ref 11.5–15.5)
WBC: 13.8 10*3/uL — ABNORMAL HIGH (ref 4.0–10.5)
nRBC: 0 % (ref 0.0–0.2)

## 2023-05-29 LAB — HCG, SERUM, QUALITATIVE: Preg, Serum: NEGATIVE

## 2023-05-29 LAB — LIPASE, BLOOD: Lipase: 28 U/L (ref 11–51)

## 2023-05-29 SURGERY — LAPAROSCOPIC CHOLECYSTECTOMY WITH INTRAOPERATIVE CHOLANGIOGRAM
Anesthesia: General | Site: Abdomen

## 2023-05-29 MED ORDER — HYDROMORPHONE HCL 1 MG/ML IJ SOLN
INTRAMUSCULAR | Status: AC
  Administered 2023-05-29: 0.5 mg via INTRAVENOUS
  Filled 2023-05-29: qty 2

## 2023-05-29 MED ORDER — PHENYLEPHRINE 80 MCG/ML (10ML) SYRINGE FOR IV PUSH (FOR BLOOD PRESSURE SUPPORT)
PREFILLED_SYRINGE | INTRAVENOUS | Status: AC
  Filled 2023-05-29: qty 10

## 2023-05-29 MED ORDER — CELECOXIB 200 MG PO CAPS
200.0000 mg | ORAL_CAPSULE | Freq: Once | ORAL | Status: AC
  Administered 2023-05-29: 200 mg via ORAL
  Filled 2023-05-29: qty 1

## 2023-05-29 MED ORDER — ROCURONIUM BROMIDE 10 MG/ML (PF) SYRINGE
PREFILLED_SYRINGE | INTRAVENOUS | Status: AC
  Filled 2023-05-29: qty 10

## 2023-05-29 MED ORDER — ONDANSETRON HCL 4 MG/2ML IJ SOLN
4.0000 mg | Freq: Once | INTRAMUSCULAR | Status: AC
  Administered 2023-05-29: 4 mg via INTRAVENOUS
  Filled 2023-05-29: qty 2

## 2023-05-29 MED ORDER — HYDROMORPHONE HCL 1 MG/ML IJ SOLN: 0.5000 mg | INTRAMUSCULAR | Status: DC | PRN

## 2023-05-29 MED ORDER — LACTATED RINGERS IV SOLN: INTRAVENOUS | Status: DC

## 2023-05-29 MED ORDER — GABAPENTIN 300 MG PO CAPS
300.0000 mg | ORAL_CAPSULE | ORAL | Status: AC
  Administered 2023-05-29: 300 mg via ORAL
  Filled 2023-05-29: qty 1

## 2023-05-29 MED ORDER — AMISULPRIDE (ANTIEMETIC) 5 MG/2ML IV SOLN
INTRAVENOUS | Status: AC
  Filled 2023-05-29: qty 4

## 2023-05-29 MED ORDER — PROPOFOL 10 MG/ML IV BOLUS
INTRAVENOUS | Status: AC
  Filled 2023-05-29: qty 20

## 2023-05-29 MED ORDER — KETOROLAC TROMETHAMINE 30 MG/ML IJ SOLN
INTRAMUSCULAR | Status: AC
  Filled 2023-05-29: qty 1

## 2023-05-29 MED ORDER — MORPHINE SULFATE (PF) 4 MG/ML IV SOLN
4.0000 mg | Freq: Once | INTRAVENOUS | Status: AC
  Administered 2023-05-29: 4 mg via INTRAVENOUS
  Filled 2023-05-29: qty 1

## 2023-05-29 MED ORDER — SUGAMMADEX SODIUM 200 MG/2ML IV SOLN
INTRAVENOUS | Status: DC | PRN
  Administered 2023-05-29: 200 mg via INTRAVENOUS

## 2023-05-29 MED ORDER — OXYCODONE HCL 5 MG PO TABS
5.0000 mg | ORAL_TABLET | ORAL | Status: DC | PRN
  Administered 2023-05-29 – 2023-05-30 (×4): 5 mg via ORAL
  Filled 2023-05-29 (×6): qty 1

## 2023-05-29 MED ORDER — FENTANYL CITRATE PF 50 MCG/ML IJ SOSY
PREFILLED_SYRINGE | INTRAMUSCULAR | Status: AC
  Administered 2023-05-29: 50 ug via INTRAVENOUS
  Filled 2023-05-29: qty 2

## 2023-05-29 MED ORDER — BUPIVACAINE-EPINEPHRINE 0.25% -1:200000 IJ SOLN
INTRAMUSCULAR | Status: DC | PRN
  Administered 2023-05-29: 20 mL

## 2023-05-29 MED ORDER — MIDAZOLAM HCL 2 MG/2ML IJ SOLN
INTRAMUSCULAR | Status: AC
  Filled 2023-05-29: qty 2

## 2023-05-29 MED ORDER — DEXMEDETOMIDINE HCL IN NACL 80 MCG/20ML IV SOLN
INTRAVENOUS | Status: DC | PRN
  Administered 2023-05-29: 8 ug via INTRAVENOUS

## 2023-05-29 MED ORDER — SCOPOLAMINE 1 MG/3DAYS TD PT72
1.0000 | MEDICATED_PATCH | TRANSDERMAL | Status: DC
  Administered 2023-05-29: 1.5 mg via TRANSDERMAL
  Filled 2023-05-29: qty 1

## 2023-05-29 MED ORDER — ENOXAPARIN SODIUM 30 MG/0.3ML IJ SOSY
30.0000 mg | PREFILLED_SYRINGE | INTRAMUSCULAR | Status: DC
  Administered 2023-05-30: 30 mg via SUBCUTANEOUS
  Filled 2023-05-29: qty 0.3

## 2023-05-29 MED ORDER — DOCUSATE SODIUM 100 MG PO CAPS
100.0000 mg | ORAL_CAPSULE | Freq: Two times a day (BID) | ORAL | Status: DC
  Administered 2023-05-29 – 2023-05-30 (×2): 100 mg via ORAL
  Filled 2023-05-29 (×2): qty 1

## 2023-05-29 MED ORDER — SODIUM CHLORIDE 0.9 % IV SOLN: INTRAVENOUS | Status: AC

## 2023-05-29 MED ORDER — FENTANYL CITRATE (PF) 250 MCG/5ML IJ SOLN
INTRAMUSCULAR | Status: DC | PRN
  Administered 2023-05-29: 50 ug via INTRAVENOUS
  Administered 2023-05-29 (×2): 100 ug via INTRAVENOUS

## 2023-05-29 MED ORDER — PROPOFOL 10 MG/ML IV BOLUS
INTRAVENOUS | Status: DC | PRN
  Administered 2023-05-29: 200 mg via INTRAVENOUS

## 2023-05-29 MED ORDER — ONDANSETRON HCL 4 MG/2ML IJ SOLN
4.0000 mg | Freq: Four times a day (QID) | INTRAMUSCULAR | Status: DC | PRN
  Administered 2023-05-29: 4 mg via INTRAVENOUS
  Filled 2023-05-29 (×2): qty 2

## 2023-05-29 MED ORDER — ORAL CARE MOUTH RINSE: 15.0000 mL | Freq: Once | OROMUCOSAL | Status: AC

## 2023-05-29 MED ORDER — ACETAMINOPHEN 325 MG PO TABS
650.0000 mg | ORAL_TABLET | Freq: Four times a day (QID) | ORAL | Status: DC
  Administered 2023-05-29 – 2023-05-30 (×3): 650 mg via ORAL
  Filled 2023-05-29 (×3): qty 2

## 2023-05-29 MED ORDER — FENTANYL CITRATE (PF) 250 MCG/5ML IJ SOLN
INTRAMUSCULAR | Status: AC
  Filled 2023-05-29: qty 5

## 2023-05-29 MED ORDER — ONDANSETRON HCL 4 MG/2ML IJ SOLN
INTRAMUSCULAR | Status: DC | PRN
Start: 2023-05-29 — End: 2023-05-29
  Administered 2023-05-29: 4 mg via INTRAVENOUS

## 2023-05-29 MED ORDER — SODIUM CHLORIDE 0.9 % IV SOLN
2.0000 g | Freq: Once | INTRAVENOUS | Status: AC
  Administered 2023-05-29: 2 g via INTRAVENOUS
  Filled 2023-05-29: qty 20

## 2023-05-29 MED ORDER — ONDANSETRON HCL 4 MG/2ML IJ SOLN
INTRAMUSCULAR | Status: AC
  Filled 2023-05-29: qty 2

## 2023-05-29 MED ORDER — DEXAMETHASONE SODIUM PHOSPHATE 10 MG/ML IJ SOLN
INTRAMUSCULAR | Status: DC | PRN
  Administered 2023-05-29: 5 mg via INTRAVENOUS

## 2023-05-29 MED ORDER — SPY AGENT GREEN - (INDOCYANINE FOR INJECTION)
1.2500 mg | Freq: Once | INTRAMUSCULAR | Status: AC
  Administered 2023-05-29: 1.25 mg via INTRAVENOUS
  Filled 2023-05-29: qty 10

## 2023-05-29 MED ORDER — ACETAMINOPHEN 500 MG PO TABS
1000.0000 mg | ORAL_TABLET | Freq: Once | ORAL | Status: AC
  Administered 2023-05-29: 1000 mg via ORAL
  Filled 2023-05-29: qty 2

## 2023-05-29 MED ORDER — LACTATED RINGERS IR SOLN
Status: DC | PRN
  Administered 2023-05-29: 1000 mL

## 2023-05-29 MED ORDER — DEXAMETHASONE SODIUM PHOSPHATE 10 MG/ML IJ SOLN
INTRAMUSCULAR | Status: AC
  Filled 2023-05-29: qty 1

## 2023-05-29 MED ORDER — SODIUM CHLORIDE 0.9 % IV BOLUS
1000.0000 mL | Freq: Once | INTRAVENOUS | Status: AC
  Administered 2023-05-29: 1000 mL via INTRAVENOUS

## 2023-05-29 MED ORDER — CHLORHEXIDINE GLUCONATE 0.12 % MT SOLN
15.0000 mL | Freq: Once | OROMUCOSAL | Status: AC
  Administered 2023-05-29: 15 mL via OROMUCOSAL
  Filled 2023-05-29: qty 15

## 2023-05-29 MED ORDER — LIDOCAINE HCL (CARDIAC) PF 100 MG/5ML IV SOSY
PREFILLED_SYRINGE | INTRAVENOUS | Status: DC | PRN
  Administered 2023-05-29: 60 mg via INTRAVENOUS

## 2023-05-29 MED ORDER — HYDROMORPHONE HCL 1 MG/ML IJ SOLN
0.2500 mg | INTRAMUSCULAR | Status: DC | PRN
  Administered 2023-05-29: 0.5 mg via INTRAVENOUS

## 2023-05-29 MED ORDER — SUCCINYLCHOLINE CHLORIDE 200 MG/10ML IV SOSY
PREFILLED_SYRINGE | INTRAVENOUS | Status: AC
  Filled 2023-05-29: qty 10

## 2023-05-29 MED ORDER — SODIUM CHLORIDE 0.9 % IV SOLN: 12.5000 mg | INTRAVENOUS | Status: DC | PRN

## 2023-05-29 MED ORDER — FENTANYL CITRATE PF 50 MCG/ML IJ SOSY
25.0000 ug | PREFILLED_SYRINGE | INTRAMUSCULAR | Status: DC | PRN
  Administered 2023-05-29 (×2): 50 ug via INTRAVENOUS

## 2023-05-29 MED ORDER — PHENYLEPHRINE 80 MCG/ML (10ML) SYRINGE FOR IV PUSH (FOR BLOOD PRESSURE SUPPORT)
PREFILLED_SYRINGE | INTRAVENOUS | Status: DC | PRN
  Administered 2023-05-29: 160 ug via INTRAVENOUS
  Administered 2023-05-29: 80 ug via INTRAVENOUS

## 2023-05-29 MED ORDER — IOHEXOL 300 MG/ML  SOLN
100.0000 mL | Freq: Once | INTRAMUSCULAR | Status: AC | PRN
  Administered 2023-05-29: 100 mL via INTRAVENOUS

## 2023-05-29 MED ORDER — CHLORHEXIDINE GLUCONATE 0.12 % MT SOLN: 15.0000 mL | Freq: Once | OROMUCOSAL | Status: DC

## 2023-05-29 MED ORDER — OXYCODONE HCL 5 MG/5ML PO SOLN: 5.0000 mg | Freq: Once | ORAL | Status: AC | PRN

## 2023-05-29 MED ORDER — OXYCODONE HCL 5 MG PO TABS: 5.0000 mg | ORAL_TABLET | Freq: Once | ORAL | Status: AC | PRN

## 2023-05-29 MED ORDER — LIDOCAINE HCL (PF) 2 % IJ SOLN
INTRAMUSCULAR | Status: AC
  Filled 2023-05-29: qty 5

## 2023-05-29 MED ORDER — OXYCODONE HCL 5 MG PO TABS
ORAL_TABLET | ORAL | Status: AC
  Administered 2023-05-29: 5 mg via ORAL
  Filled 2023-05-29: qty 1

## 2023-05-29 MED ORDER — MIDAZOLAM HCL 2 MG/2ML IJ SOLN
INTRAMUSCULAR | Status: DC | PRN
  Administered 2023-05-29: 2 mg via INTRAVENOUS

## 2023-05-29 MED ORDER — FENTANYL CITRATE PF 50 MCG/ML IJ SOSY
PREFILLED_SYRINGE | INTRAMUSCULAR | Status: AC
Start: 2023-05-29 — End: 2023-05-29
  Filled 2023-05-29: qty 1

## 2023-05-29 MED ORDER — DEXMEDETOMIDINE HCL IN NACL 80 MCG/20ML IV SOLN
INTRAVENOUS | Status: AC
  Filled 2023-05-29: qty 20

## 2023-05-29 MED ORDER — ROCURONIUM BROMIDE 10 MG/ML (PF) SYRINGE
PREFILLED_SYRINGE | INTRAVENOUS | Status: DC | PRN
  Administered 2023-05-29: 50 mg via INTRAVENOUS

## 2023-05-29 MED ORDER — AMISULPRIDE (ANTIEMETIC) 5 MG/2ML IV SOLN
10.0000 mg | Freq: Once | INTRAVENOUS | Status: AC | PRN
  Administered 2023-05-29: 10 mg via INTRAVENOUS

## 2023-05-29 MED ORDER — BUPIVACAINE-EPINEPHRINE 0.25% -1:200000 IJ SOLN
INTRAMUSCULAR | Status: AC
  Filled 2023-05-29: qty 1

## 2023-05-29 MED ORDER — PROPOFOL 1000 MG/100ML IV EMUL
INTRAVENOUS | Status: AC
  Filled 2023-05-29: qty 100

## 2023-05-29 SURGICAL SUPPLY — 30 items
APPLIER CLIP 5 13 M/L LIGAMAX5 (MISCELLANEOUS) ×1 IMPLANT
BAG COUNTER SPONGE SURGICOUNT (BAG) IMPLANT
CABLE HIGH FREQUENCY MONO STRZ (ELECTRODE) ×2 IMPLANT
CATH REDDICK CHOLANGI 4FR 50CM (CATHETERS) ×2 IMPLANT
CHLORAPREP W/TINT 26 (MISCELLANEOUS) ×2 IMPLANT
CLIP APPLIE 5 13 M/L LIGAMAX5 (MISCELLANEOUS) ×2 IMPLANT
COVER MAYO STAND XLG (MISCELLANEOUS) ×2 IMPLANT
DERMABOND ADVANCED .7 DNX12 (GAUZE/BANDAGES/DRESSINGS) ×2 IMPLANT
DRAPE C-ARM 42X120 X-RAY (DRAPES) ×2 IMPLANT
ELECT REM PT RETURN 15FT ADLT (MISCELLANEOUS) ×2 IMPLANT
GLOVE BIO SURGEON STRL SZ7.5 (GLOVE) ×2 IMPLANT
GOWN STRL REUS W/ TWL LRG LVL3 (GOWN DISPOSABLE) IMPLANT
HEMOSTAT SNOW SURGICEL 2X4 (HEMOSTASIS) IMPLANT
IRRIG SUCT STRYKERFLOW 2 WTIP (MISCELLANEOUS) ×1 IMPLANT
IRRIGATION SUCT STRKRFLW 2 WTP (MISCELLANEOUS) ×2 IMPLANT
IV CATH 14GX2 1/4 (CATHETERS) ×2 IMPLANT
KIT BASIN OR (CUSTOM PROCEDURE TRAY) ×2 IMPLANT
KIT TURNOVER KIT A (KITS) IMPLANT
PENCIL SMOKE EVACUATOR (MISCELLANEOUS) IMPLANT
SCISSORS LAP 5X35 DISP (ENDOMECHANICALS) ×2 IMPLANT
SET TUBE SMOKE EVAC HIGH FLOW (TUBING) ×2 IMPLANT
SLEEVE Z-THREAD 5X100MM (TROCAR) ×4 IMPLANT
SPIKE FLUID TRANSFER (MISCELLANEOUS) ×2 IMPLANT
SUT MNCRL AB 4-0 PS2 18 (SUTURE) ×2 IMPLANT
SYS BAG RETRIEVAL 10MM (BASKET) ×1 IMPLANT
SYSTEM BAG RETRIEVAL 10MM (BASKET) ×2 IMPLANT
TOWEL OR 17X26 10 PK STRL BLUE (TOWEL DISPOSABLE) ×2 IMPLANT
TRAY LAPAROSCOPIC (CUSTOM PROCEDURE TRAY) ×2 IMPLANT
TROCAR BALLN 12MMX100 BLUNT (TROCAR) ×2 IMPLANT
TROCAR Z-THREAD OPTICAL 5X100M (TROCAR) ×2 IMPLANT

## 2023-05-29 NOTE — Discharge Instructions (Signed)

## 2023-05-29 NOTE — Anesthesia Procedure Notes (Signed)
 Procedure Name: Intubation Date/Time: 05/29/2023 7:53 AM  Performed by: Cleda Clarks, CRNAPre-anesthesia Checklist: Patient identified, Emergency Drugs available, Suction available and Patient being monitored Patient Re-evaluated:Patient Re-evaluated prior to induction Oxygen Delivery Method: Circle system utilized Preoxygenation: Pre-oxygenation with 100% oxygen Induction Type: IV induction Ventilation: Mask ventilation without difficulty Laryngoscope Size: Miller and 2 Grade View: Grade II Tube type: Oral Tube size: 7.0 mm Number of attempts: 1 Airway Equipment and Method: Stylet and Oral airway Placement Confirmation: ETT inserted through vocal cords under direct vision, positive ETCO2 and breath sounds checked- equal and bilateral Secured at: 22 cm Tube secured with: Tape Dental Injury: Teeth and Oropharynx as per pre-operative assessment

## 2023-05-29 NOTE — Anesthesia Postprocedure Evaluation (Signed)
 Anesthesia Post Note  Patient: Theresa Lawson  Procedure(s) Performed: LAPAROSCOPIC CHOLECYSTECTOMY WITH ICG (Abdomen)     Patient location during evaluation: PACU Anesthesia Type: General Level of consciousness: awake and alert Pain management: pain level controlled Vital Signs Assessment: post-procedure vital signs reviewed and stable Respiratory status: spontaneous breathing, nonlabored ventilation and respiratory function stable Cardiovascular status: stable and blood pressure returned to baseline Anesthetic complications: no   No notable events documented.  Last Vitals:  Vitals:   05/29/23 1000 05/29/23 1015  BP: 103/70 99/70  Pulse: (!) 59 (!) 59  Resp: 16 11  Temp:    SpO2: 100% 97%    Last Pain:  Vitals:   05/29/23 1015  TempSrc:   PainSc: Asleep                 [name]

## 2023-05-29 NOTE — ED Notes (Signed)
 Patient transported to CT

## 2023-05-29 NOTE — ED Provider Notes (Signed)
 [place] EMERGENCY DEPARTMENT AT [facility] Provider Note   CSN: [id number] Arrival date & time: 05/28/23  2325     History  Chief Complaint  Patient presents with   Abdominal Pain    Theresa Lawson is a 33 y.o. female.  The history is provided by the patient and medical records.  Abdominal Pain Theresa Lawson is a 33 y.o. female who presents to the Emergency Department complaining of abdominal pain.  She presents to the emergency department for epigastric and right upper quadrant abdominal pain that started 1 week ago.  Initially symptoms were waxing and waning but they have been constant for the last 2 days.  Has associated nausea, vomiting.  No fevers, diarrhea.  No prior similar symptoms.  No dysuria.  She has no known medical problems takes no routine medications.  No prior abdominal surgeries.      Home Medications Prior to Admission medications   Medication Sig Start Date End Date Taking? Authorizing Provider  albuterol (VENTOLIN HFA) 108 (90 Base) MCG/ACT inhaler Inhale 2 puffs into the lungs every 4 (four) hours as needed for shortness of breath. Patient not taking: Reported on 06/15/2022 03/06/19   Brock Bad, MD  famotidine (PEPCID) 20 MG tablet Take 1 tablet (20 mg total) by mouth daily. 12/26/22   Carlisle Beers, FNP  metroNIDAZOLE (FLAGYL) 500 MG tablet Take 1 tablet (500 mg total) by mouth 2 (two) times daily. 01/23/23   Mardella Layman, MD  VITAMIN D PO Take 2 capsules by mouth daily.    [provider]  cetirizine (ZYRTEC) 10 MG tablet Take 1 tablet (10 mg total) by mouth daily. Patient not taking: Reported on 08/16/2018 08/13/18 10/14/18  Hurshel Party, CNM      Allergies    Patient has no known allergies.    Review of Systems   Review of Systems  Gastrointestinal:  Positive for abdominal pain.  All other systems reviewed and are negative.   Physical Exam Updated Vital Signs BP (!) 138/94   Pulse 70   Temp 97.8 F  (36.6 C) (Oral)   Resp 18   Wt 71 kg   SpO2 100%   BMI 26.87 kg/m  Physical Exam Vitals and nursing note reviewed.  Constitutional:      Appearance: She is well-developed.  HENT:     Head: Normocephalic and atraumatic.  Cardiovascular:     Rate and Rhythm: Normal rate and regular rhythm.     Heart sounds: No murmur heard. Pulmonary:     Effort: Pulmonary effort is normal. No respiratory distress.     Breath sounds: Normal breath sounds.  Abdominal:     General: There is distension.     Palpations: Abdomen is soft.     Tenderness: There is no guarding or rebound.     Comments: Moderate epigastric distension with moderate upper abdominal/ruq tenderness  Musculoskeletal:        General: No tenderness.  Skin:    General: Skin is warm and dry.  Neurological:     Mental Status: She is alert and oriented to person, place, and time.  Psychiatric:        Behavior: Behavior normal.     ED Results / Procedures / Treatments   Labs (all labs ordered are listed, but only abnormal results are displayed) Labs Reviewed  COMPREHENSIVE METABOLIC PANEL - Abnormal; Notable for the following components:      Result Value   CO2 20 (*)  Glucose, Bld 125 (*)    All other components within normal limits  CBC WITH DIFFERENTIAL/PLATELET - Abnormal; Notable for the following components:   WBC 13.8 (*)    Neutro Abs 11.7 (*)    All other components within normal limits  LIPASE, BLOOD  HCG, SERUM, QUALITATIVE  URINALYSIS, ROUTINE W REFLEX MICROSCOPIC    EKG None  Radiology CT ABDOMEN PELVIS W CONTRAST Result Date: 05/29/2023 CLINICAL DATA:  33 year old female with abdominal pain for 1 week. Constipation. EXAM: CT ABDOMEN AND PELVIS WITH CONTRAST TECHNIQUE: Multidetector CT imaging of the abdomen and pelvis was performed using the standard protocol following bolus administration of intravenous contrast. RADIATION DOSE REDUCTION: This exam was performed according to the departmental  dose-optimization program which includes automated exposure control, adjustment of the mA and/or kV according to patient size and/or use of iterative reconstruction technique. CONTRAST:  OMNIPAQUE IOHEXOL 300 MG/ML  SOLN COMPARISON:  None Available. FINDINGS: Lower chest: Negative.  Lung bases are clear. Hepatobiliary: Numerous gallstones are evident, including in the neck of the gallbladder (series 2, image 32). Gallbladder wall is indistinct on coronal image 46 and there is subtle hyperenhancement of the liver at the gallbladder fossa. Background liver parenchyma within normal limits. CBD is at the upper limits of normal. Pancreas: Negative. Spleen: Negative. Adrenals/Urinary Tract: Negative.  Incidental pelvic phleboliths. Stomach/Bowel: No dilated bowel loops. Mildly redundant but otherwise unremarkable colon. Normal retrocecal appendix on series 2, image 60. Decompressed terminal ileum. No abnormal small bowel. Stomach and duodenum appear negative. No free air, free fluid, or mesenteric inflammation identified. Vascular/Lymphatic: Aorta and major arterial structures in the abdomen and pelvis appear patent and normal. Portal venous system appears to be patent. No lymphadenopathy. Reproductive: Within normal limits. Other: Small volume of free fluid in the cul-de-sac with simple fluid density. Musculoskeletal: Negative. IMPRESSION: 1. Abnormal gallbladder, with numerous gallstones and indistinct wall appearance raising the possibility of Acute Cholecystitis. Right upper quadrant Ultrasound would be complementary. 2. No other acute or inflammatory process identified in the abdomen or pelvis. Normal appendix. Electronically Signed   By: Odessa Fleming M.D.   On: 05/29/2023 05:57   US Abdomen Limited RUQ (LIVER/GB) Result Date: 05/29/2023 CLINICAL DATA:  Right upper quadrant abdominal pain x1 week EXAM: ULTRASOUND ABDOMEN LIMITED RIGHT UPPER QUADRANT COMPARISON:  None Available. FINDINGS: Gallbladder: Multiple  gallstones measuring up to 19 mm. No gallbladder wall thickening or pericholecystic fluid. Negative sonographic Murphy's sign. Common bile duct: Diameter: 6 mm Liver: No focal lesion identified. Within normal limits in parenchymal echogenicity. Portal vein is patent on color Doppler imaging with normal direction of blood flow towards the liver. Other: None. IMPRESSION: Cholelithiasis, without associated sonographic findings to suggest acute cholecystitis. Electronically Signed   By: Charline Bills M.D.   On: 05/29/2023 02:51    Procedures Procedures    Medications Ordered in ED Medications  cefTRIAXone (ROCEPHIN) 2 g in sodium chloride 0.9 % 100 mL IVPB (2 g Intravenous New Bag/Given 05/29/23 0619)  lactated ringers infusion (has no administration in time range)  chlorhexidine (PERIDEX) 0.12 % solution 15 mL (has no administration in time range)    Or  Oral care mouth rinse (has no administration in time range)  HYDROmorphone (DILAUDID) injection 0.5-1 mg (has no administration in time range)  oxyCODONE (Oxy IR/ROXICODONE) immediate release tablet 5 mg (has no administration in time range)  acetaminophen (TYLENOL) tablet 650 mg (has no administration in time range)  ondansetron (ZOFRAN) injection 4 mg (has no administration in  time range)  docusate sodium (COLACE) capsule 100 mg (has no administration in time range)  0.9 %  sodium chloride infusion (has no administration in time range)  morphine (PF) 4 MG/ML injection 4 mg (4 mg Intravenous Given 05/29/23 0240)  ondansetron (ZOFRAN) injection 4 mg (4 mg Intravenous Given 05/29/23 0237)  sodium chloride 0.9 % bolus 1,000 mL (0 mLs Intravenous Stopped 05/29/23 0400)  morphine (PF) 4 MG/ML injection 4 mg (4 mg Intravenous Given 05/29/23 0406)  iohexol (OMNIPAQUE) 300 MG/ML solution 100 mL (100 mLs Intravenous Contrast Given 05/29/23 0511)    ED Course/ Medical Decision Making/ A&P                                 Medical Decision  Making Amount and/or Complexity of Data Reviewed Labs: ordered. Radiology: ordered.  Risk Prescription drug management. Decision regarding hospitalization.   Patient without significant medical history here for evaluation of abdominal pain.  Patient has mild to moderate tenderness on examination.  CBC with leukocytosis, labs are otherwise unremarkable.  Right upper quadrant ultrasound does demonstrate cholelithiasis but no other signs of cholecystitis.  Patient with persistent pain in the emergency department.  CT abdomen pelvis was obtained, which demonstrates persistent cholelithiasis, concern for possible cholecystitis.  No findings of internal hernia, appendicitis.  Patient with ongoing pain despite medications.  Will start on antibiotics.  Discussed with patient concern for cholecystitis.  Discussed with Dr. Doylene Canard with general surgery-surgery will see the patient in consult.        Final Clinical Impression(s) / ED Diagnoses Final diagnoses:  Acute cholecystitis    Rx / DC Orders ED Discharge Orders     None         Tilden Fossa, MD 05/29/23 [phone]

## 2023-05-29 NOTE — Plan of Care (Signed)
 ?  Problem: Clinical Measurements: ?Goal: Ability to maintain clinical measurements within normal limits will improve ?Outcome: Progressing ?Goal: Will remain free from infection ?Outcome: Progressing ?Goal: Diagnostic test results will improve ?Outcome: Progressing ?  ?

## 2023-05-29 NOTE — Transfer of Care (Signed)
 Immediate Anesthesia Transfer of Care Note  Patient: Theresa Lawson  Procedure(s) Performed: LAPAROSCOPIC CHOLECYSTECTOMY WITH ICG (Abdomen)  Patient Location: PACU  Anesthesia Type:General  Level of Consciousness: awake, alert , and oriented  Airway & Oxygen Therapy: Patient Spontanous Breathing and Patient connected to face mask oxygen  Post-op Assessment: Report given to RN and Post -op Vital signs reviewed and stable  Post vital signs: Reviewed and stable  Last Vitals:  Vitals Value Taken Time  BP 118/72 05/29/23 0905  Temp 36.5 C 05/29/23 0905  Pulse 97 05/29/23 0906  Resp 14 05/29/23 0907  SpO2 100 % 05/29/23 0906  Vitals shown include unfiled device data.  Last Pain:  Vitals:   05/29/23 0706  TempSrc: Oral  PainSc: 4       Patients Stated Pain Goal: 0 (05/29/23 0706)  Complications: No notable events documented.

## 2023-05-29 NOTE — H&P (Signed)
 Surgical Evaluation  Chief Complaint: Abdominal pain  HPI: This is a 33 year old otherwise healthy woman who presents with right upper quadrant abdominal pain.  She has actually had intermittent pain for about 2 months, but for the last week it has been more frequent and progressively worsening, and reports constant pain for the last 2 days.  For the last few days she has had nausea and vomiting, p.o. intolerance due to both vomiting and pain.  The pain is located in the right upper quadrant and epigastrium.  Denies any known fevers, denies jaundice, denies any diarrhea or changes to bowel movements.  Has had partial and transient relief from pain medications administered in the ER. Has undergone ultrasound which shows gallstones up to 19 mm but no sonographic evidence of cholecystitis, common bile duct measured at 6 mm.  Of Sequent CT scan notes gallstones at the neck of the gallbladder and indistinctness of the gallbladder wall concerning for cholecystitis.  No previous abdominal surgeries.  She works as a Lawyer.  No Known Allergies  Past Medical History:  Diagnosis Date   Asthma    childhood   Eczema    Headache(784.0)    SINCE CURRENT PREGNANCY   Infection    YEAST INF;NOT FREQ   Irregular periods/menstrual cycles 12/18/2011   Polyhydramnios 2010   WAS GETTING REG NST'S DURING PREG   Pregnancy induced hypertension 2010   NO MEDS;HAD INCREASED STESS   Pyelonephritis    DURING AND BEFORE PREG;WAS HOSPITALIZED PREVIOUSLY   Seasonal allergies    Unsure LMP 12/18/2011   EDC by [redacted]w[redacted]d u/s at [facility]    Past Surgical History:  Procedure Laterality Date   NO PAST SURGERIES     VAGINAL DELIVERY      Family History  Problem Relation Age of Onset   Asthma Sister    Asthma Son    Asthma Brother        X 2   Nephrolithiasis Mother        HAS HAD 2-3 SURGERIES TO REMOVE   Kidney disease Mother     Social History   Socioeconomic History   Marital status: Single    Spouse name: Not  on file   Number of children: 4   Years of education: 11   Highest education level: Not on file  Occupational History   Occupation: CNA    Employer: [employer]  Tobacco Use   Smoking status: Never   Smokeless tobacco: Never  Vaping Use   Vaping status: Never Used  Substance and Sexual Activity   Alcohol use: Not Currently   Drug use: Not Currently    Types: Marijuana   Sexual activity: Yes    Partners: Male    Birth control/protection: None  Other Topics Concern   Not on file  Social History Narrative   Not on file   Social Drivers of Health   Financial Resource Strain: Not on file  Food Insecurity: No Food Insecurity (06/15/2022)   Hunger Vital Sign    Worried About Running Out of Food in the Last Year: Never true    Ran Out of Food in the Last Year: Never true  Transportation Needs: No Transportation Needs (06/15/2022)   PRAPARE - Administrator, Civil Service (Medical): No    Lack of Transportation (Non-Medical): No  Physical Activity: Not on file  Stress: Not on file  Social Connections: Not on file    No current facility-administered medications on file prior to encounter.  Current Outpatient Medications on File Prior to Encounter  Medication Sig Dispense Refill   albuterol (VENTOLIN HFA) 108 (90 Base) MCG/ACT inhaler Inhale 2 puffs into the lungs every 4 (four) hours as needed for shortness of breath. (Patient not taking: Reported on 06/15/2022) 8 g 0   famotidine (PEPCID) 20 MG tablet Take 1 tablet (20 mg total) by mouth daily. 30 tablet 0   metroNIDAZOLE (FLAGYL) 500 MG tablet Take 1 tablet (500 mg total) by mouth 2 (two) times daily. 14 tablet 0   VITAMIN D PO Take 2 capsules by mouth daily.     [DISCONTINUED] cetirizine (ZYRTEC) 10 MG tablet Take 1 tablet (10 mg total) by mouth daily. (Patient not taking: Reported on 08/16/2018) 30 tablet 5    Review of Systems: a complete, 10pt review of systems was completed with pertinent positives and  negatives as documented in the HPI  Physical Exam: Vitals:   05/29/23 0245 05/29/23 0355  BP: (!) 144/97 (!) 152/98  Pulse: 76 71  Resp: (!) 7 18  Temp:  97.8 F (36.6 C)  SpO2: 100% 100%   Gen: A&Ox3, appears very uncomfortable Chest: respiratory effort is normal.  Cardiovascular: RRR with palpable distal pulses Gastrointestinal: soft, nondistended, exquisitely tender in the right upper quadrant, mildly tender in the epigastrium without palpable mass. No hepatomegaly or splenomegaly. No hernia. Muscoloskeletal: no clubbing or cyanosis of the fingers.  Strength is symmetrical throughout.  Range of motion of bilateral upper and lower extremities normal without pain, crepitation or contracture. Neuro: cranial nerves grossly intact.  Sensation intact to light touch diffusely. Psych: appropriate mood and affect, normal insight/judgment intact  Skin: warm and dry      Latest Ref Rng & Units 05/29/2023   12:10 AM 06/16/2022    6:16 AM 06/15/2022    4:07 AM  CBC  WBC 4.0 - 10.5 K/uL 13.8  9.8  15.0   Hemoglobin 12.0 - 15.0 g/dL 16.1  9.1  09.6   Hematocrit 36.0 - 46.0 % 37.9  27.5  31.6   Platelets 150 - 400 K/uL 212  193  200        Latest Ref Rng & Units 05/29/2023   12:10 AM 06/15/2022    4:07 AM 12/31/2018   12:10 PM  CMP  Glucose 70 - 99 mg/dL 045  409  811   BUN 6 - 20 mg/dL 13  <5  5   Creatinine 0.44 - 1.00 mg/dL 9.14  7.82  9.56   Sodium 135 - 145 mmol/L 136  135  136   Potassium 3.5 - 5.1 mmol/L 4.1  3.6  3.8   Chloride 98 - 111 mmol/L 107  105  108   CO2 22 - 32 mmol/L 20  20  19    Calcium 8.9 - 10.3 mg/dL 9.1  8.8  8.9   Total Protein 6.5 - 8.1 g/dL 6.9  6.1  5.8   Total Bilirubin 0.0 - 1.2 mg/dL 0.5  0.3  0.4   Alkaline Phos 38 - 126 U/L 70  185  138   AST 15 - 41 U/L 16  20  17    ALT 0 - 44 U/L 14  11  13      No results found for: "INR", "PROTIME"  Imaging: CT ABDOMEN PELVIS W CONTRAST Result Date: 05/29/2023 CLINICAL DATA:  33 year old female with  abdominal pain for 1 week. Constipation. EXAM: CT ABDOMEN AND PELVIS WITH CONTRAST TECHNIQUE: Multidetector CT imaging of the abdomen and pelvis was  performed using the standard protocol following bolus administration of intravenous contrast. RADIATION DOSE REDUCTION: This exam was performed according to the departmental dose-optimization program which includes automated exposure control, adjustment of the mA and/or kV according to patient size and/or use of iterative reconstruction technique. CONTRAST:  OMNIPAQUE IOHEXOL 300 MG/ML  SOLN COMPARISON:  None Available. FINDINGS: Lower chest: Negative.  Lung bases are clear. Hepatobiliary: Numerous gallstones are evident, including in the neck of the gallbladder (series 2, image 32). Gallbladder wall is indistinct on coronal image 46 and there is subtle hyperenhancement of the liver at the gallbladder fossa. Background liver parenchyma within normal limits. CBD is at the upper limits of normal. Pancreas: Negative. Spleen: Negative. Adrenals/Urinary Tract: Negative.  Incidental pelvic phleboliths. Stomach/Bowel: No dilated bowel loops. Mildly redundant but otherwise unremarkable colon. Normal retrocecal appendix on series 2, image 60. Decompressed terminal ileum. No abnormal small bowel. Stomach and duodenum appear negative. No free air, free fluid, or mesenteric inflammation identified. Vascular/Lymphatic: Aorta and major arterial structures in the abdomen and pelvis appear patent and normal. Portal venous system appears to be patent. No lymphadenopathy. Reproductive: Within normal limits. Other: Small volume of free fluid in the cul-de-sac with simple fluid density. Musculoskeletal: Negative. IMPRESSION: 1. Abnormal gallbladder, with numerous gallstones and indistinct wall appearance raising the possibility of Acute Cholecystitis. Right upper quadrant Ultrasound would be complementary. 2. No other acute or inflammatory process identified in the abdomen or pelvis.  Normal appendix. Electronically Signed   By: Odessa Fleming M.D.   On: 05/29/2023 05:57   US Abdomen Limited RUQ (LIVER/GB) Result Date: 05/29/2023 CLINICAL DATA:  Right upper quadrant abdominal pain x1 week EXAM: ULTRASOUND ABDOMEN LIMITED RIGHT UPPER QUADRANT COMPARISON:  None Available. FINDINGS: Gallbladder: Multiple gallstones measuring up to 19 mm. No gallbladder wall thickening or pericholecystic fluid. Negative sonographic Murphy's sign. Common bile duct: Diameter: 6 mm Liver: No focal lesion identified. Within normal limits in parenchymal echogenicity. Portal vein is patent on color Doppler imaging with normal direction of blood flow towards the liver. Other: None. IMPRESSION: Cholelithiasis, without associated sonographic findings to suggest acute cholecystitis. Electronically Signed   By: Charline Bills M.D.   On: 05/29/2023 02:51     A/P: Clinically she has acute cholecystitis on a background of crescendoing biliary colic. I recommend proceeding with laparoscopic cholecystectomy with possible cholangiogram.  I reviewed the relevant anatomy and surgical technique with the patient, and discussed risks of surgery including bleeding, infection, pain, scarring, intraabdominal injury specifically to the common bile duct and sequelae, bile leak, conversion to open surgery, subtotal cholecystectomy, blood clot, pneumonia, heart attack, stroke, failure to resolve symptoms, etc. Questions welcomed and answered.  Will plan to proceed later today with Dr. Cheral Bay.    Patient Active Problem List   Diagnosis Date Noted   Normal labor 06/15/2022   Postpartum exam 09/25/2020   Indication for care or intervention in labor or delivery 08/28/2020   Supervision of normal pregnancy, antepartum 02/10/2020   Counseling for initiation of birth control method 01/22/2019   History of gestational hypertension 12/26/2018   Close exposure to COVID-19 virus 12/26/2018   Vaginal delivery 01/19/2012       Phylliss Blakes, MD [facility]  See AMION to contact appropriate on-call provider

## 2023-05-29 NOTE — Anesthesia Preprocedure Evaluation (Addendum)
 Anesthesia Evaluation  Patient identified by MRN, date of birth, ID band Patient awake    Reviewed: Allergy & Precautions, NPO status , Patient's Chart, lab work & pertinent test results  History of Anesthesia Complications Negative for: history of anesthetic complications  Airway Mallampati: II  TM Distance: >3 FB Neck ROM: Full    Dental  (+) Dental Advisory Given, Teeth Intact   Pulmonary asthma    Pulmonary exam normal        Cardiovascular hypertension, Normal cardiovascular exam     Neuro/Psych  Headaches  negative psych ROS   GI/Hepatic negative GI ROS, Neg liver ROS,,,  Endo/Other  negative endocrine ROS    Renal/GU negative Renal ROS     Musculoskeletal negative musculoskeletal ROS (+)    Abdominal   Peds  Hematology negative hematology ROS (+)   Anesthesia Other Findings   Reproductive/Obstetrics  Hx PIH                              Anesthesia Physical Anesthesia Plan  ASA: 2  Anesthesia Plan: General   Post-op Pain Management: Tylenol PO (pre-op)* and Celebrex PO (pre-op)*   Induction: Intravenous  PONV Risk Score and Plan: 3 and Treatment may vary due to age or medical condition, Ondansetron, Dexamethasone, Midazolam and Scopolamine patch - Pre-op  Airway Management Planned: Oral ETT  Additional Equipment: None  Intra-op Plan:   Post-operative Plan: Extubation in OR  Informed Consent: I have reviewed the patients History and Physical, chart, labs and discussed the procedure including the risks, benefits and alternatives for the proposed anesthesia with the patient or authorized representative who has indicated his/her understanding and acceptance.     Dental advisory given  Plan Discussed with: CRNA and Anesthesiologist  Anesthesia Plan Comments:        Anesthesia Quick Evaluation

## 2023-05-29 NOTE — Op Note (Signed)
 05/28/2023 - 05/29/2023  8:52 AM  PATIENT:  Theresa Lawson  33 y.o. female  PRE-OPERATIVE DIAGNOSIS:  Cholelithiasis  POST-OPERATIVE DIAGNOSIS:  Acute cholecystitis with cholelithiasis  PROCEDURE:  Procedure(s): LAPAROSCOPIC CHOLECYSTECTOMY WITH ICG (N/A)  SURGEON:  Surgeons and Role:    Griselda Miner, MD - Primary  PHYSICIAN ASSISTANT:   ASSISTANTS: none   ANESTHESIA:   local and general  EBL:  20 mL   BLOOD ADMINISTERED:none  DRAINS: none   LOCAL MEDICATIONS USED:  MARCAINE     SPECIMEN:  Source of Specimen:  gallbladder  DISPOSITION OF SPECIMEN:  PATHOLOGY  COUNTS:  YES  TOURNIQUET:  * No tourniquets in log *  DICTATION: .Dragon Dictation    Procedure: After informed consent was obtained the patient was brought to the operating room and placed in the supine position on the operating room table. After adequate induction of general anesthesia the patient's abdomen was prepped with ChloraPrep allowed to dry and draped in usual sterile manner.  Prior to coming to the operating room the patient was injected with ICG dye.  An appropriate timeout was performed. The area below the umbilicus was infiltrated with quarter percent  Marcaine. A small incision was made with a 15 blade knife. The incision was carried down through the subcutaneous tissue bluntly with a hemostat and [employer] retractors. The linea alba was identified. The linea alba was incised with a 15 blade knife and each side was grasped with Coker clamps. The preperitoneal space was then probed with a hemostat until the peritoneum was opened and access was gained to the abdominal cavity. A 0 Vicryl pursestring stitch was placed in the fascia surrounding the opening. A Hassan cannula was then placed through the opening and anchored in place with the previously placed Vicryl purse string stitch. The abdomen was insufflated with carbon dioxide without difficulty. A laparoscope was inserted through the [facility] cannula in  the right upper quadrant was inspected. Next the epigastric region was infiltrated with % Marcaine. A small incision was made with a 15 blade knife. A 5 mm port was placed bluntly through this incision into the abdominal cavity under direct vision. Next 2 sites were chosen laterally on the right side of the abdomen for placement of 5 mm ports. Each of these areas was infiltrated with quarter percent Marcaine. Small stab incisions were made with a 15 blade knife. 5 mm ports were then placed bluntly through these incisions into the abdominal cavity under direct vision without difficulty. A blunt grasper was placed through the lateralmost 5 mm port and used to grasp the dome of the gallbladder and elevate it anteriorly and superiorly. Another blunt grasper was placed through the other 5 mm port and used to retract the body and neck of the gallbladder. A dissector was placed through the epigastric port and using the electrocautery the peritoneal reflection at the gallbladder neck was opened. Blunt dissection was then carried out in this area until the gallbladder neck-cystic duct junction was readily identified and a good window was created.  The camera was switched to the ICG mode and we could clearly identify the cystic duct as well as the location of the junction of the cystic and common duct.  A single clip was placed on the gallbladder neck. 3 clips were placed proximally on the cystic duct and the duct was divided between the 2 sets of clips. Posterior to this the cystic artery was identified and again dissected bluntly in a circumferential manner until  a good window  was created. 2 clips were placed proximally and one distally on the artery and the artery was divided between the 2 sets of clips. Next a laparoscopic hook cautery device was used to separate the gallbladder from the liver bed. Prior to completely detaching the gallbladder from the liver bed the liver bed was inspected and several small bleeding  points were coagulated with the electrocautery until the area was completely hemostatic. The gallbladder was then detached the rest of it from the liver bed without difficulty. A laparoscopic bag was inserted through the hassan port. The laparoscope was moved to the epigastric port. The gallbladder was placed within the bag and the bag was sealed.  The bag with the gallbladder was then removed with the [facility] cannula through the infraumbilical port without difficulty. The fascial defect was then closed with the previously placed Vicryl pursestring stitch as well as with another figure-of-eight 0 Vicryl stitch. The liver bed was inspected again and found to be hemostatic. The abdomen was irrigated with copious amounts of saline until the effluent was clear. The ports were then removed under direct vision without difficulty and were found to be hemostatic. The gas was allowed to escape. No other abnormalities were noted on general inspection of the abdomen. The skin incisions were all closed with interrupted 4-0 Monocryl subcuticular stitches. Dermabond dressings were applied. The patient tolerated the procedure well. At the end of the case all needle sponge and instrument counts were correct. The patient was then awakened and taken to recovery in stable condition  PLAN OF CARE: Admit for overnight observation  PATIENT DISPOSITION:  PACU - hemodynamically stable.   Delay start of Pharmacological VTE agent (>24hrs) due to surgical blood loss or risk of bleeding: no

## 2023-05-30 ENCOUNTER — Encounter (HOSPITAL_COMMUNITY): Payer: Self-pay | Admitting: General Surgery

## 2023-05-30 LAB — SURGICAL PATHOLOGY

## 2023-05-30 MED ORDER — OXYCODONE HCL 5 MG PO TABS: 5.0000 mg | ORAL_TABLET | ORAL | 0 refills | Status: DC | PRN

## 2023-05-30 MED ORDER — DOCUSATE SODIUM 100 MG PO CAPS: 100.0000 mg | ORAL_CAPSULE | Freq: Every day | ORAL | Status: DC | PRN

## 2023-05-30 MED ORDER — ALUM & MAG HYDROXIDE-SIMETH 200-200-20 MG/5ML PO SUSP
30.0000 mL | Freq: Four times a day (QID) | ORAL | Status: DC | PRN
  Administered 2023-05-30: 30 mL via ORAL
  Filled 2023-05-30: qty 30

## 2023-05-30 MED ORDER — ACETAMINOPHEN 325 MG PO TABS: 650.0000 mg | ORAL_TABLET | Freq: Four times a day (QID) | ORAL | Status: DC | PRN

## 2023-05-30 NOTE — Discharge Summary (Signed)
 [facility] Discharge Summary   Patient ID: Theresa Lawson MRN: [id number] DOB/AGE: 32-Jun-1992 32 y.o.  Admit date: 05/28/2023 Discharge date: 05/30/2023  Admitting Diagnosis: Acute cholecystitis   Discharge Diagnosis S/P laparoscopic cholecystectomy   Consultants None   Imaging: CT ABDOMEN PELVIS W CONTRAST Result Date: 05/29/2023 CLINICAL DATA:  33 year old female with abdominal pain for 1 week. Constipation. EXAM: CT ABDOMEN AND PELVIS WITH CONTRAST TECHNIQUE: Multidetector CT imaging of the abdomen and pelvis was performed using the standard protocol following bolus administration of intravenous contrast. RADIATION DOSE REDUCTION: This exam was performed according to the departmental dose-optimization program which includes automated exposure control, adjustment of the mA and/or kV according to patient size and/or use of iterative reconstruction technique. CONTRAST:  OMNIPAQUE IOHEXOL 300 MG/ML  SOLN COMPARISON:  None Available. FINDINGS: Lower chest: Negative.  Lung bases are clear. Hepatobiliary: Numerous gallstones are evident, including in the neck of the gallbladder (series 2, image 32). Gallbladder wall is indistinct on coronal image 46 and there is subtle hyperenhancement of the liver at the gallbladder fossa. Background liver parenchyma within normal limits. CBD is at the upper limits of normal. Pancreas: Negative. Spleen: Negative. Adrenals/Urinary Tract: Negative.  Incidental pelvic phleboliths. Stomach/Bowel: No dilated bowel loops. Mildly redundant but otherwise unremarkable colon. Normal retrocecal appendix on series 2, image 60. Decompressed terminal ileum. No abnormal small bowel. Stomach and duodenum appear negative. No free air, free fluid, or mesenteric inflammation identified. Vascular/Lymphatic: Aorta and major arterial structures in the abdomen and pelvis appear patent and normal. Portal venous system appears to be patent. No lymphadenopathy. Reproductive:  Within normal limits. Other: Small volume of free fluid in the cul-de-sac with simple fluid density. Musculoskeletal: Negative. IMPRESSION: 1. Abnormal gallbladder, with numerous gallstones and indistinct wall appearance raising the possibility of Acute Cholecystitis. Right upper quadrant Ultrasound would be complementary. 2. No other acute or inflammatory process identified in the abdomen or pelvis. Normal appendix. Electronically Signed   By: Odessa Fleming M.D.   On: 05/29/2023 05:57   US Abdomen Limited RUQ (LIVER/GB) Result Date: 05/29/2023 CLINICAL DATA:  Right upper quadrant abdominal pain x1 week EXAM: ULTRASOUND ABDOMEN LIMITED RIGHT UPPER QUADRANT COMPARISON:  None Available. FINDINGS: Gallbladder: Multiple gallstones measuring up to 19 mm. No gallbladder wall thickening or pericholecystic fluid. Negative sonographic Murphy's sign. Common bile duct: Diameter: 6 mm Liver: No focal lesion identified. Within normal limits in parenchymal echogenicity. Portal vein is patent on color Doppler imaging with normal direction of blood flow towards the liver. Other: None. IMPRESSION: Cholelithiasis, without associated sonographic findings to suggest acute cholecystitis. Electronically Signed   By: Charline Bills M.D.   On: 05/29/2023 02:51    Procedures Dr. Chevis Pretty (05/29/23) - Laparoscopic Cholecystectomy   Hospital Course:  Patient is a 33 year old female who presented to the ED with abdominal pain.  Workup showed acute cholecystitis.  Patient was admitted and underwent procedure listed above.  Tolerated procedure well and was transferred to the floor.  Diet was advanced as tolerated.  On POD1, the patient was voiding well, tolerating diet, ambulating well, pain well controlled, vital signs stable, incisions c/d/i and felt stable for discharge home.  Patient will follow up in our office in 3-4 weeks and knows to call with questions or concerns.    Physical Exam: General:  Alert, NAD, pleasant,  comfortable Abd:  Soft, ND, mild tenderness, incisions C/D/I  I or a member of my team have reviewed this patient in the Controlled Substance Database.  Allergies as of 05/30/2023   No Known Allergies      Medication List     TAKE these medications    acetaminophen 325 MG tablet Commonly known as: TYLENOL Take 2 tablets (650 mg total) by mouth every 6 (six) hours as needed for mild pain (pain score 1-3) or fever.   docusate sodium 100 MG capsule Commonly known as: COLACE Take 1 capsule (100 mg total) by mouth daily as needed for mild constipation.   oxyCODONE 5 MG immediate release tablet Commonly known as: Oxy IR/ROXICODONE Take 1 tablet (5 mg total) by mouth every 4 (four) hours as needed for severe pain (pain score 7-10) or moderate pain (pain score 4-6).          Follow-up Information     Maczis, Hedda Slade, [place]. Go on 06/22/2023.   Specialty: General Surgery Why: at 3:30 for post-operative follow up. call to confirm appointment date/time. Contact information: [address] [place] [address] [phone]                 Signed: Juliet Rude , [facility] 05/30/2023, 11:37 AM Please see Amion for pager number during day hours 7:00am-4:30pm

## 2023-05-30 NOTE — Progress Notes (Signed)
   05/30/23 1129  TOC Brief Assessment  Insurance and Status Reviewed  Patient has primary care physician No  Home environment has been reviewed resides in a private residence  Prior level of function: Independent  Prior/Current Home Services No current home services  Social Drivers of Health Review SDOH reviewed no interventions necessary  Readmission risk has been reviewed Yes  Transition of care needs no transition of care needs at this time

## 2023-05-30 NOTE — Progress Notes (Signed)
 Reviewed d/c instructions with pt. All questions answered. Pt taken to front entrance via wheelchair to meet ride.

## 2023-08-15 ENCOUNTER — Ambulatory Visit (HOSPITAL_COMMUNITY)
Admission: EM | Admit: 2023-08-15 | Discharge: 2023-08-15 | Disposition: A | Discharge status: Home / Self Care | Attending: Family Medicine | Admitting: Family Medicine

## 2023-08-15 ENCOUNTER — Encounter (HOSPITAL_COMMUNITY): Payer: Self-pay | Admitting: *Deleted

## 2023-08-15 ENCOUNTER — Other Ambulatory Visit: Payer: Self-pay

## 2023-08-15 DIAGNOSIS — M545 Low back pain, unspecified: Secondary | ICD-10-CM | POA: Diagnosis present

## 2023-08-15 DIAGNOSIS — R300 Dysuria: Secondary | ICD-10-CM | POA: Diagnosis present

## 2023-08-15 DIAGNOSIS — R1030 Lower abdominal pain, unspecified: Secondary | ICD-10-CM | POA: Insufficient documentation

## 2023-08-15 LAB — POCT URINE PREGNANCY: Preg Test, Ur: NEGATIVE

## 2023-08-15 LAB — POCT URINALYSIS DIP (MANUAL ENTRY)
Bilirubin, UA: NEGATIVE
Glucose, UA: NEGATIVE mg/dL
Ketones, POC UA: NEGATIVE mg/dL
Leukocytes, UA: NEGATIVE
Nitrite, UA: NEGATIVE
Protein Ur, POC: NEGATIVE mg/dL
Spec Grav, UA: >=1.03 — AB (ref 1.010–1.025)
Urobilinogen, UA: 0.2 U/dL
pH, UA: 6 (ref 5.0–8.0)

## 2023-08-15 NOTE — Discharge Instructions (Addendum)
 Pending gonorrhea, chlamydia, trichomonas, bacterial vaginitis, and yeast testing Ensure adequate oral hydration May use Tylenol /Motrin  for discomfort Follow up with your primary care doctor for further evaluation as needed.

## 2023-08-15 NOTE — ED Triage Notes (Signed)
 PT reports dysuria ,low back pain and ABD pain. Pt also wants screening for anything else as well.

## 2023-08-15 NOTE — ED Provider Notes (Signed)
 [facility]    CSN: [id number] Arrival date & time: 08/15/23  1523      History   Chief Complaint Chief Complaint  Patient presents with   Back Pain   Abdominal Pain   Dysuria    HPI Theresa Lawson is a 33 y.o. female.   Patient is here requesting evaluation for low abdominal and low back pain that has been ongoing since Thursday.  Patient states that it has become more constant over the past 3 days with associated dysuria.  She denies any vaginal bleeding or discharge.  She is sexually active with 1 female partner and occasionally uses condoms.  No concerns for sexually transmitted infections although she is requesting screening since it has been a while since her last testing.  She denies chance of pregnancy.  Denies rashes, lesions, or concern for vaginal foreign body.  No reported vomiting or diarrhea.  Her last normal bowel movement was 1 to 2 days ago.  She recently had her gallbladder removed.  The history is provided by the patient.  Back Pain Associated symptoms: abdominal pain and dysuria   Associated symptoms: no chest pain, no fever and no pelvic pain   Abdominal Pain Associated symptoms: dysuria   Associated symptoms: no chest pain, no chills, no cough, no diarrhea, no fatigue, no fever, no nausea, no shortness of breath, no sore throat, no vaginal bleeding, no vaginal discharge and no vomiting   Dysuria Associated symptoms: abdominal pain   Associated symptoms: no fever, no nausea, no vaginal discharge and no vomiting     Past Medical History:  Diagnosis Date   Asthma    childhood   Eczema    Headache(784.0)    SINCE CURRENT PREGNANCY   Infection    YEAST INF;NOT FREQ   Irregular periods/menstrual cycles 12/18/2011   Polyhydramnios 2010   WAS GETTING REG NST'S DURING PREG   Pregnancy induced hypertension 2010   NO MEDS;HAD INCREASED STESS   Pyelonephritis    DURING AND BEFORE PREG;WAS HOSPITALIZED PREVIOUSLY   Seasonal allergies    Unsure LMP  12/18/2011   EDC by [redacted]w[redacted]d u/[facility] at [facility]    Patient Active Problem List   Diagnosis Date Noted   Gallstones 05/29/2023   Normal labor 06/15/2022   Postpartum exam 09/25/2020   Indication for care or intervention in labor or delivery 08/28/2020   Supervision of normal pregnancy, antepartum 02/10/2020   Counseling for initiation of birth control method 01/22/2019   History of gestational hypertension 12/26/2018   Close exposure to COVID-19 virus 12/26/2018   Vaginal delivery 01/19/2012    Past Surgical History:  Procedure Laterality Date   CHOLECYSTECTOMY N/A 05/29/2023   Procedure: LAPAROSCOPIC CHOLECYSTECTOMY WITH ICG;  Surgeon: Lillette Reid III, MD;  Location: [facility];  Service: General;  Laterality: N/A;   NO PAST SURGERIES     VAGINAL DELIVERY      OB History     Gravida  7   Para  6   Term  6   Preterm  0   AB  1   Living  6      SAB  0   IAB  1   Ectopic  0   Multiple  0   Live Births  6            Home Medications    Prior to Admission medications   Medication Sig Start Date End Date Taking? Authorizing Provider  acetaminophen  (TYLENOL ) 325 MG tablet Take 2  tablets (650 mg total) by mouth every 6 (six) hours as needed for mild pain (pain score 1-3) or fever. 05/30/23   Annetta Killian, PA-C  docusate sodium  (COLACE) 100 MG capsule Take 1 capsule (100 mg total) by mouth daily as needed for mild constipation. 05/30/23   Annetta Killian, PA-C  oxyCODONE  (OXY IR/ROXICODONE ) 5 MG immediate release tablet Take 1 tablet (5 mg total) by mouth every 4 (four) hours as needed for severe pain (pain score 7-10) or moderate pain (pain score 4-6). 05/30/23   Annetta Killian, PA-C  cetirizine  (ZYRTEC ) 10 MG tablet Take 1 tablet (10 mg total) by mouth daily. Patient not taking: Reported on 08/16/2018 08/13/18 10/14/18  Leftwich-Kirby, Darren Em, CNM    Family History Family History  Problem Relation Age of Onset   Asthma Sister    Asthma Son    Asthma Brother        X  2   Nephrolithiasis Mother        HAS HAD 2-3 SURGERIES TO REMOVE   Kidney disease Mother     Social History Social History   Tobacco Use   Smoking status: Never   Smokeless tobacco: Never  Vaping Use   Vaping status: Never Used  Substance Use Topics   Alcohol use: Not Currently   Drug use: Not Currently    Types: Marijuana     Allergies   Patient has no known allergies.   Review of Systems Review of Systems  Constitutional:  Negative for chills, fatigue and fever.  HENT:  Negative for congestion, rhinorrhea and sore throat.   Eyes:  Negative for pain and redness.  Respiratory:  Negative for cough and shortness of breath.   Cardiovascular:  Negative for chest pain and palpitations.  Gastrointestinal:  Positive for abdominal pain. Negative for diarrhea, nausea and vomiting.  Genitourinary:  Positive for dysuria. Negative for genital sores, pelvic pain, vaginal bleeding, vaginal discharge and vaginal pain.  Musculoskeletal:  Positive for back pain. Negative for myalgias.  Skin:  Negative for rash.     Physical Exam Triage Vital Signs ED Triage Vitals  Encounter Vitals Group     BP 08/15/23 1616 120/81     Systolic BP Percentile --      Diastolic BP Percentile --      Pulse Rate 08/15/23 1616 65     Resp 08/15/23 1616 18     Temp 08/15/23 1616 98.4 F (36.9 C)     Temp src --      SpO2 08/15/23 1616 97 %     Weight --      Height --      Head Circumference --      Peak Flow --      Pain Score 08/15/23 1614 8     Pain Loc --      Pain Education --      Exclude from Growth Chart --    No data found.  Updated Vital Signs BP 120/81   Pulse 65   Temp 98.4 F (36.9 C)   Resp 18   LMP 07/28/2023   SpO2 97%   Visual Acuity Right Eye Distance:   Left Eye Distance:   Bilateral Distance:    Right Eye Near:   Left Eye Near:    Bilateral Near:     Physical Exam Vitals and nursing note reviewed.  Constitutional:      Appearance: Normal appearance.   Cardiovascular:     Rate and  Rhythm: Normal rate and regular rhythm.     Heart sounds: Normal heart sounds.  Pulmonary:     Effort: Pulmonary effort is normal.     Breath sounds: Normal breath sounds.  Abdominal:     General: Bowel sounds are normal. There is no distension.     Palpations: Abdomen is soft.     Tenderness: There is abdominal tenderness. There is no guarding.     Comments: Low abdominal discomfort  Musculoskeletal:     Lumbar back: Tenderness present. No bony tenderness. Normal range of motion.     Comments: Discomfort in the lower left paraspinal muscles.  Skin:    General: Skin is warm and dry.  Neurological:     General: No focal deficit present.     Mental Status: She is alert and oriented to person, place, and time.  Psychiatric:        Mood and Affect: Mood normal.        Behavior: Behavior normal.        Thought Content: Thought content normal.        Judgment: Judgment normal.      UC Treatments / Results  Labs (all labs ordered are listed, but only abnormal results are displayed) Labs Reviewed  POCT URINALYSIS DIP (MANUAL ENTRY) - Abnormal; Notable for the following components:      Result Value   Spec Grav, UA >=1.030 (*)    Blood, UA trace-intact (*)    All other components within normal limits  POCT URINE PREGNANCY  CERVICOVAGINAL ANCILLARY ONLY    EKG   Radiology No results found.  Procedures Procedures (including critical care time)  Medications Ordered in UC Medications - No data to display  Initial Impression / Assessment and Plan / UC Course  I have reviewed the triage vital signs and the nursing notes.  Pertinent labs & imaging results that were available during my care of the patient were reviewed by me and considered in my medical decision making (see chart for details).     Patient presents for evaluation of a 5 days of lower abdominal discomfort and low back pain.  She also endorses mild dysuria.  POCT UA is  unremarkable for concerns of infection.  POCT urine pregnancy test is negative.  Pending gonorrhea, chlamydia, trichomonas, BV, and yeast testing - will plan ongoing care based upon these results.  I discussed the use of Tylenol  and ibuprofen  for discomfort as she has not tried anything as of yet.  Red flags were reviewed which would warrant return evaluation.  She is not currently experiencing any upper abdominal pain or signs of systemic infection so lower suspicion for post surgical cholecystectomy concerns.  Final Clinical Impressions(s) / UC Diagnoses   Final diagnoses:  Dysuria  Lower abdominal pain  Acute left-sided low back pain without sciatica     Discharge Instructions      Pending gonorrhea, chlamydia, trichomonas, bacterial vaginitis, and yeast testing Ensure adequate oral hydration May use Tylenol /Motrin  for discomfort Follow up with your primary care doctor for further evaluation as needed.   ED Prescriptions   None    PDMP not reviewed this encounter.   Genene Kennel, FNP 08/15/23 [phone]

## 2023-08-16 LAB — CERVICOVAGINAL ANCILLARY ONLY
Bacterial Vaginitis (gardnerella): NEGATIVE
Candida Glabrata: POSITIVE — AB
Candida Vaginitis: NEGATIVE
Chlamydia: NEGATIVE
Comment: NEGATIVE
Comment: NEGATIVE
Comment: NEGATIVE
Comment: NEGATIVE
Comment: NEGATIVE
Comment: NORMAL
Neisseria Gonorrhea: NEGATIVE
Trichomonas: NEGATIVE

## 2023-08-17 ENCOUNTER — Ambulatory Visit (HOSPITAL_COMMUNITY): Payer: Self-pay

## 2023-10-20 ENCOUNTER — Encounter: Payer: Self-pay | Admitting: Advanced Practice Midwife

## 2023-11-07 ENCOUNTER — Ambulatory Visit (HOSPITAL_COMMUNITY): Payer: Self-pay | Admitting: Physician Assistant

## 2023-11-07 ENCOUNTER — Encounter (HOSPITAL_COMMUNITY): Payer: Self-pay

## 2023-11-07 ENCOUNTER — Ambulatory Visit (INDEPENDENT_AMBULATORY_CARE_PROVIDER_SITE_OTHER)

## 2023-11-07 ENCOUNTER — Ambulatory Visit (HOSPITAL_COMMUNITY)
Admission: EM | Admit: 2023-11-07 | Discharge: 2023-11-07 | Disposition: A | Discharge status: Home / Self Care | Attending: Physician Assistant | Admitting: Physician Assistant

## 2023-11-07 DIAGNOSIS — M7989 Other specified soft tissue disorders: Secondary | ICD-10-CM | POA: Diagnosis not present

## 2023-11-07 DIAGNOSIS — L03011 Cellulitis of right finger: Secondary | ICD-10-CM

## 2023-11-07 MED ORDER — CEPHALEXIN 500 MG PO CAPS: 500.0000 mg | ORAL_CAPSULE | Freq: Three times a day (TID) | ORAL | 0 refills | Status: DC

## 2023-11-07 MED ORDER — SULFAMETHOXAZOLE-TRIMETHOPRIM 800-160 MG PO TABS: 1.0000 | ORAL_TABLET | Freq: Two times a day (BID) | ORAL | 0 refills | Status: AC

## 2023-11-07 NOTE — ED Triage Notes (Signed)
 Pt states broke her nail to rt pointer finger and now has redness and pain x3 days, soaking with salt water. States also has a itchy rash to top of rt hand and using cortisone cream with relief.

## 2023-11-07 NOTE — ED Provider Notes (Signed)
 [facility]    CSN: [id number] Arrival date & time: 11/07/23  0803      History   Chief Complaint Chief Complaint  Patient presents with   Hand Pain    HPI Theresa Lawson is a 33 y.o. female.   Patient presents today with a 3-day history of right distal index finger pain and swelling.  She had artificial nails and broke the nail which caused a small crack in the nail along the nailbed.  She then went and had this replaced at the nail shop and a few days later developed pain and swelling.  She has since removed the acrylic nail but continues to have significant pain and swelling.  Pain is rated 9/10 on a 0-10 pain scale, described as throbbing, no aggravating or relieving factors identified.  She denies any fever, nausea, vomiting.  Denies any recent antibiotics.  Denies history of diabetes or immunosuppression.  Denies history of MRSA or recurrent skin infections.  She is confident that she is not pregnant.  She is right-handed.    Past Medical History:  Diagnosis Date   Asthma    childhood   Eczema    Headache(784.0)    SINCE CURRENT PREGNANCY   Infection    YEAST INF;NOT FREQ   Irregular periods/menstrual cycles 12/18/2011   Polyhydramnios 2010   WAS GETTING REG NST'S DURING PREG   Pregnancy induced hypertension 2010   NO MEDS;HAD INCREASED STESS   Pyelonephritis    DURING AND BEFORE PREG;WAS HOSPITALIZED PREVIOUSLY   Seasonal allergies    Unsure LMP 12/18/2011   EDC by [redacted]w[redacted]d u/s at [facility]    Patient Active Problem List   Diagnosis Date Noted   Gallstones 05/29/2023   Normal labor 06/15/2022   Postpartum exam 09/25/2020   Indication for care or intervention in labor or delivery 08/28/2020   Supervision of normal pregnancy, antepartum 02/10/2020   Counseling for initiation of birth control method 01/22/2019   History of gestational hypertension 12/26/2018   Close exposure to COVID-19 virus 12/26/2018   Vaginal delivery 01/19/2012    Past Surgical History:   Procedure Laterality Date   CHOLECYSTECTOMY N/A 05/29/2023   Procedure: LAPAROSCOPIC CHOLECYSTECTOMY WITH ICG;  Surgeon: Curvin Mt III, MD;  Location: [facility];  Service: General;  Laterality: N/A;   NO PAST SURGERIES     VAGINAL DELIVERY      OB History     Gravida  7   Para  6   Term  6   Preterm  0   AB  1   Living  6      SAB  0   IAB  1   Ectopic  0   Multiple  0   Live Births  6            Home Medications    Prior to Admission medications   Medication Sig Start Date End Date Taking? Authorizing Provider  cephALEXin  (KEFLEX ) 500 MG capsule Take 1 capsule (500 mg total) by mouth 3 (three) times daily. 11/07/23  Yes Linette Gunderson K, PA-C  sulfamethoxazole -trimethoprim  (BACTRIM  DS) 800-160 MG tablet Take 1 tablet by mouth 2 (two) times daily for 7 days. 11/07/23 11/14/23 Yes Yassmine Tamm K, PA-C  cetirizine  (ZYRTEC ) 10 MG tablet Take 1 tablet (10 mg total) by mouth daily. Patient not taking: Reported on 08/16/2018 08/13/18 10/14/18  Milly Olam LABOR, CNM    Family History Family History  Problem Relation Age of Onset   Asthma Sister  Asthma Son    Asthma Brother        X 2   Nephrolithiasis Mother        HAS HAD 2-3 SURGERIES TO REMOVE   Kidney disease Mother     Social History Social History   Tobacco Use   Smoking status: Never   Smokeless tobacco: Never  Vaping Use   Vaping status: Never Used  Substance Use Topics   Alcohol use: Not Currently   Drug use: Not Currently    Types: Marijuana     Allergies   Patient has no known allergies.   Review of Systems Review of Systems  Constitutional:  Negative for activity change, appetite change, fatigue and fever.  Musculoskeletal:  Positive for joint swelling. Negative for arthralgias and myalgias.  Skin:  Positive for color change. Negative for wound.  Neurological:  Negative for weakness and numbness.     Physical Exam Triage Vital Signs ED Triage Vitals  Encounter Vitals Group      BP 11/07/23 0832 (!) 142/88     Girls Systolic BP Percentile --      Girls Diastolic BP Percentile --      Boys Systolic BP Percentile --      Boys Diastolic BP Percentile --      Pulse Rate 11/07/23 0832 79     Resp 11/07/23 0832 18     Temp 11/07/23 0832 (!) 97.5 F (36.4 C)     Temp Source 11/07/23 0832 Oral     SpO2 11/07/23 0832 99 %     Weight --      Height --      Head Circumference --      Peak Flow --      Pain Score 11/07/23 0833 0     Pain Loc --      Pain Education --      Exclude from Growth Chart --    No data found.  Updated Vital Signs BP (!) 142/88 (BP Location: Right Arm)   Pulse 79   Temp (!) 97.5 F (36.4 C) (Oral)   Resp 18   LMP 10/25/2023 (Exact Date)   SpO2 99%   Breastfeeding No   Visual Acuity Right Eye Distance:   Left Eye Distance:   Bilateral Distance:    Right Eye Near:   Left Eye Near:    Bilateral Near:     Physical Exam Vitals reviewed.  Constitutional:      General: She is awake. She is not in acute distress.    Appearance: Normal appearance. She is well-developed. She is not ill-appearing.     Comments: Very pleasant female appears stated age in no acute distress sitting comfortably in exam room  HENT:     Head: Normocephalic and atraumatic.  Cardiovascular:     Rate and Rhythm: Normal rate and regular rhythm.     Heart sounds: Normal heart sounds, S1 normal and S2 normal. No murmur heard.    Comments: Capillary fill within 2 seconds right fingers Pulmonary:     Effort: Pulmonary effort is normal.     Breath sounds: Normal breath sounds. No wheezing, rhonchi or rales.     Comments: Clear to auscultation bilaterally Abdominal:     General: Bowel sounds are normal.     Palpations: Abdomen is soft.     Tenderness: There is no abdominal tenderness. There is no right CVA tenderness, left CVA tenderness, guarding or rebound.  Musculoskeletal:     Right hand:  Swelling and tenderness present. No bony tenderness. Normal  range of motion. There is no disruption of two-point discrimination. Normal capillary refill.     Comments: Right index finger: Significant swelling and tenderness at distal right finger with increased swelling at proximal nail fold.  No fluid collection or significant fluctuance.  Hand is neurovascularly intact.  Psychiatric:        Behavior: Behavior is cooperative.      UC Treatments / Results  Labs (all labs ordered are listed, but only abnormal results are displayed) Labs Reviewed - No data to display  EKG   Radiology No results found.  Procedures Procedures (including critical care time)  Medications Ordered in UC Medications - No data to display  Initial Impression / Assessment and Plan / UC Course  I have reviewed the triage vital signs and the nursing notes.  Pertinent labs & imaging results that were available during my care of the patient were reviewed by me and considered in my medical decision making (see chart for details).     Patient is well-appearing, afebrile, nontoxic, nontachycardic.  Patient has paronychia on exam with concern for developing felon.  X-ray was obtained that showed no evidence of osteomyelitis based on my primary read; we were waiting for radiologist overread at the time of discharge and we will contact her if this differs and changes her treatment plan.  She was encouraged to continue with warm soaks and will add oral antibiotics.  She was started on cephalexin  as well as Bactrim  DS.  We discussed that she should stop the medication be seen immediately if she develops any rash or lesions following initiation of this medicine.  No indication for dose adjustment based on metabolic panel from 05/29/2023 with creatinine of 0.66 and calculated creatinine clearance of 137 mL each per minute.  Tetanus was updated 01/23/2019.  Given this is her dominant hand again concerning for worsening pain/worsening infection I did recommend that she follow-up soon as  possible with hand specialist and she was given the contact information for the specialist on-call with instruction to call to them to schedule an appointment.  We discussed that if anything changes or worsens and she has increasing pain, discoloration of the finger, numbness or tingling in the finger, fever, nausea, vomiting she needs to go to the emergency room immediately.  Strict return precautions given.  Excuse note provided.  Final Clinical Impressions(s) / UC Diagnoses   Final diagnoses:  Swelling of right index finger  Paronychia of finger of right hand     Discharge Instructions      I did not see any evidence of the infection going into your finger on the x-ray but we will call you if the radiologist sees something and it changes our treatment plan.  Start cephalexin  3 times daily for 7 days and Bactrim  DS twice daily for 7 days.  If he develop any rash or oral lesions stop the medication to be seen immediately.  I would like you to follow-up with a hand specialist soon as possible; call to schedule an appointment.  As we discussed, if anything worsens and you have increasing pain, numbness or tingling in the finger, discoloration of the finger, fever, nausea, vomiting you need to go to the emergency room immediately.     ED Prescriptions     Medication Sig Dispense Auth. Provider   cephALEXin  (KEFLEX ) 500 MG capsule Take 1 capsule (500 mg total) by mouth 3 (three) times daily. 21 capsule Dallana Mavity, Rocky  K, PA-C   sulfamethoxazole -trimethoprim  (BACTRIM  DS) 800-160 MG tablet Take 1 tablet by mouth 2 (two) times daily for 7 days. 14 tablet Daishaun Ayre K, PA-C      PDMP not reviewed this encounter.   Sherrell Rocky POUR, PA-C 11/07/23 [id number]

## 2023-11-07 NOTE — Discharge Instructions (Signed)
 I did not see any evidence of the infection going into your finger on the x-ray but we will call you if the radiologist sees something and it changes our treatment plan.  Start cephalexin  3 times daily for 7 days and Bactrim  DS twice daily for 7 days.  If he develop any rash or oral lesions stop the medication to be seen immediately.  I would like you to follow-up with a hand specialist soon as possible; call to schedule an appointment.  As we discussed, if anything worsens and you have increasing pain, numbness or tingling in the finger, discoloration of the finger, fever, nausea, vomiting you need to go to the emergency room immediately.

## 2024-04-08 ENCOUNTER — Ambulatory Visit (HOSPITAL_COMMUNITY): Admission: EM | Admit: 2024-04-08 | Discharge: 2024-04-08 | Disposition: A | Discharge status: Home / Self Care

## 2024-04-08 ENCOUNTER — Encounter (HOSPITAL_COMMUNITY): Payer: Self-pay

## 2024-04-08 DIAGNOSIS — N898 Other specified noninflammatory disorders of vagina: Secondary | ICD-10-CM | POA: Diagnosis not present

## 2024-04-08 DIAGNOSIS — Z711 Person with feared health complaint in whom no diagnosis is made: Secondary | ICD-10-CM | POA: Diagnosis not present

## 2024-04-08 LAB — POCT URINALYSIS DIP (MANUAL ENTRY)
Bilirubin, UA: NEGATIVE
Blood, UA: NEGATIVE
Glucose, UA: NEGATIVE mg/dL
Leukocytes, UA: NEGATIVE
Nitrite, UA: NEGATIVE
Protein Ur, POC: NEGATIVE mg/dL
Spec Grav, UA: >=1.03 — AB
Urobilinogen, UA: 0.2 U/dL
pH, UA: 5.5

## 2024-04-08 NOTE — Discharge Instructions (Addendum)
 Your urinalysis is negative for urinary tract infection.  STD testing pending, this will take 2-3 days to result. We will only call you if your testing is positive for any infection(s) and we will provide treatment.  Avoid sexual intercourse until your STD results come back.  If any of your STD results are positive, you will need to avoid sexual intercourse for 7 days while you are being treated to prevent spread of STD.  Condom use is the best way to prevent spread of STDs. Notify partner(s) of any positive results.  Return to urgent care as needed.

## 2024-04-08 NOTE — ED Triage Notes (Addendum)
 Pt has c/o vaginal d/c, foul smelling urine and stomach cramps. Pt would also like to be tested for STD just the swab.

## 2024-04-08 NOTE — ED Provider Notes (Signed)
 " [facility]    CSN: [id number] Arrival date & time: 04/08/24  0825      History   Chief Complaint Chief Complaint  Patient presents with   Vaginal Discharge    HPI Theresa Lawson is a 34 y.o. female.   This 34 year old female is being seen for complaints of malodorous vaginal discharge, malodorous urine, lower abdominal cramping ongoing for 2 days.  She denies chest pain, shortness of breath.  She denies nausea, vomiting, diarrhea.  She reports she has lower abdominal cramping consistent with menstrual cramping.  She says that she is due for her menstrual cycle soon.  She is requesting STI testing with swab only.   Vaginal Discharge Associated symptoms: no abdominal pain, no dysuria, no fever, no nausea and no vomiting     Past Medical History:  Diagnosis Date   Asthma    childhood   Eczema    Headache(784.0)    SINCE CURRENT PREGNANCY   Infection    YEAST INF;NOT FREQ   Irregular periods/menstrual cycles 12/18/2011   Polyhydramnios 2010   WAS GETTING REG NST'S DURING PREG   Pregnancy induced hypertension 2010   NO MEDS;HAD INCREASED STESS   Pyelonephritis    DURING AND BEFORE PREG;WAS HOSPITALIZED PREVIOUSLY   Seasonal allergies    Unsure LMP 12/18/2011   EDC by [redacted]w[redacted]d u/s at [facility]    Patient Active Problem List   Diagnosis Date Noted   Gallstones 05/29/2023   Normal labor 06/15/2022   Postpartum exam 09/25/2020   Indication for care or intervention in labor or delivery 08/28/2020   Supervision of normal pregnancy, antepartum 02/10/2020   Counseling for initiation of birth control method 01/22/2019   History of gestational hypertension 12/26/2018   Close exposure to COVID-19 virus 12/26/2018   Vaginal delivery 01/19/2012    Past Surgical History:  Procedure Laterality Date   CHOLECYSTECTOMY N/A 05/29/2023   Procedure: LAPAROSCOPIC CHOLECYSTECTOMY WITH ICG;  Surgeon: Curvin Mt III, MD;  Location: [facility];  Service: General;  Laterality: N/A;   NO PAST  SURGERIES     VAGINAL DELIVERY      OB History     Gravida  7   Para  6   Term  6   Preterm  0   AB  1   Living  6      SAB  0   IAB  1   Ectopic  0   Multiple  0   Live Births  6            Home Medications    Prior to Admission medications  Medication Sig Start Date End Date Taking? Authorizing Provider  cetirizine  (ZYRTEC ) 10 MG tablet Take 1 tablet (10 mg total) by mouth daily. Patient not taking: Reported on 08/16/2018 08/13/18 10/14/18  Leftwich-Kirby, Olam LABOR, CNM    Family History Family History  Problem Relation Age of Onset   Asthma Sister    Asthma Son    Asthma Brother        X 2   Nephrolithiasis Mother        HAS HAD 2-3 SURGERIES TO REMOVE   Kidney disease Mother     Social History Social History[1]   Allergies   Patient has no known allergies.   Review of Systems Review of Systems  Constitutional:  Negative for chills and fever.  Respiratory:  Negative for shortness of breath.   Cardiovascular:  Negative for chest pain.  Gastrointestinal:  Negative for  abdominal pain, diarrhea, nausea and vomiting.  Genitourinary:  Positive for vaginal discharge. Negative for difficulty urinating, dysuria, frequency, genital sores, pelvic pain, urgency, vaginal bleeding and vaginal pain.  Skin:  Negative for color change and rash.  Neurological:  Negative for dizziness and headaches.  All other systems reviewed and are negative.    Physical Exam Triage Vital Signs ED Triage Vitals [04/08/24 0905]  Encounter Vitals Group     BP (!) 139/96     Girls Systolic BP Percentile      Girls Diastolic BP Percentile      Boys Systolic BP Percentile      Boys Diastolic BP Percentile      Pulse Rate 64     Resp 17     Temp 98 F (36.7 C)     Temp src      SpO2 99 %     Weight      Height      Head Circumference      Peak Flow      Pain Score      Pain Loc      Pain Education      Exclude from Growth Chart    No data found.  Updated  Vital Signs BP (!) 139/96 (BP Location: Left Arm)   Pulse 64   Temp 98 F (36.7 C)   Resp 17   LMP 03/17/2024 (Exact Date)   SpO2 99%   Visual Acuity Right Eye Distance:   Left Eye Distance:   Bilateral Distance:    Right Eye Near:   Left Eye Near:    Bilateral Near:     Physical Exam Vitals and nursing note reviewed.  Constitutional:      General: She is not in acute distress.    Appearance: She is well-developed.     Comments: Pleasant female appearing stated age found sitting in chair in no acute distress.  HENT:     Head: Normocephalic and atraumatic.     Mouth/Throat:     Lips: Pink.  Eyes:     Conjunctiva/sclera: Conjunctivae normal.  Cardiovascular:     Rate and Rhythm: Normal rate and regular rhythm.     Heart sounds: Normal heart sounds. No murmur heard. Pulmonary:     Effort: Pulmonary effort is normal. No respiratory distress.     Breath sounds: Normal breath sounds.  Abdominal:     General: Bowel sounds are normal.     Palpations: Abdomen is soft.     Tenderness: There is no abdominal tenderness.  Skin:    General: Skin is warm and dry.  Neurological:     Mental Status: She is alert.  Psychiatric:        Mood and Affect: Mood normal.        Speech: Speech normal.        Behavior: Behavior normal.      UC Treatments / Results  Labs (all labs ordered are listed, but only abnormal results are displayed) Labs Reviewed  POCT URINALYSIS DIP (MANUAL ENTRY) - Abnormal; Notable for the following components:      Result Value   Ketones, POC UA small (15) (*)    Spec Grav, UA >=1.030 (*)    All other components within normal limits  CERVICOVAGINAL ANCILLARY ONLY    EKG   Radiology No results found.  Procedures Procedures (including critical care time)  Medications Ordered in UC Medications - No data to display  Initial Impression / Assessment and  Plan / UC Course  I have reviewed the triage vital signs and the nursing notes.  Pertinent  labs & imaging results that were available during my care of the patient were reviewed by me and considered in my medical decision making (see chart for details).     Vitals and triage reviewed, patient is hemodynamically stable.  UA is negative for infection.  Cervicovaginal swab obtained.  She was offered HIV and syphilis screening but declined at this time.  Staff will contact if results are positive to initiate appropriate treatment.  Plan of care, follow-up care, return precautions given, no questions at this time. Final Clinical Impressions(s) / UC Diagnoses   Final diagnoses:  Vaginal discharge  Concern about STD in female without diagnosis     Discharge Instructions      Your urinalysis is negative for urinary tract infection.  STD testing pending, this will take 2-3 days to result. We will only call you if your testing is positive for any infection(s) and we will provide treatment.  Avoid sexual intercourse until your STD results come back.  If any of your STD results are positive, you will need to avoid sexual intercourse for 7 days while you are being treated to prevent spread of STD.  Condom use is the best way to prevent spread of STDs. Notify partner(s) of any positive results.  Return to urgent care as needed.       ED Prescriptions   None    PDMP not reviewed this encounter.    [1]  Social History Tobacco Use   Smoking status: Never   Smokeless tobacco: Never  Vaping Use   Vaping status: Never Used  Substance Use Topics   Alcohol use: Not Currently   Drug use: Not Currently    Types: Marijuana     Lennice Jon BROCKS, FNP 04/08/24 1008  "

## 2024-04-09 ENCOUNTER — Ambulatory Visit (HOSPITAL_COMMUNITY): Payer: Self-pay

## 2024-04-09 LAB — CERVICOVAGINAL ANCILLARY ONLY
Bacterial Vaginitis (gardnerella): POSITIVE — AB
Candida Glabrata: NEGATIVE
Candida Vaginitis: NEGATIVE
Chlamydia: NEGATIVE
Comment: NEGATIVE
Comment: NEGATIVE
Comment: NEGATIVE
Comment: NEGATIVE
Comment: NEGATIVE
Comment: NORMAL
Neisseria Gonorrhea: NEGATIVE
Trichomonas: NEGATIVE

## 2024-04-09 MED ORDER — METRONIDAZOLE 500 MG PO TABS: 500.0000 mg | ORAL_TABLET | Freq: Two times a day (BID) | ORAL | 0 refills | Status: AC
